# Patient Record
Sex: Male | Born: 1940 | Race: White | Hispanic: No
Health system: Midwestern US, Community
[De-identification: ages and names within clinical notes are randomized; demographics above are authoritative.]

## PROBLEM LIST (undated history)

## (undated) DIAGNOSIS — G4733 Obstructive sleep apnea (adult) (pediatric): Secondary | ICD-10-CM

## (undated) DIAGNOSIS — D7218 Eosinophilia in diseases classified elsewhere: Secondary | ICD-10-CM

## (undated) DIAGNOSIS — M301 Polyarteritis with lung involvement [Churg-Strauss]: Secondary | ICD-10-CM

## (undated) DIAGNOSIS — N390 Urinary tract infection, site not specified: Secondary | ICD-10-CM

## (undated) DIAGNOSIS — J45909 Unspecified asthma, uncomplicated: Secondary | ICD-10-CM

## (undated) DIAGNOSIS — J4520 Mild intermittent asthma, uncomplicated: Secondary | ICD-10-CM

## (undated) DIAGNOSIS — Z9109 Other allergy status, other than to drugs and biological substances: Secondary | ICD-10-CM

## (undated) DIAGNOSIS — K219 Gastro-esophageal reflux disease without esophagitis: Secondary | ICD-10-CM

## (undated) DIAGNOSIS — K2950 Unspecified chronic gastritis without bleeding: Secondary | ICD-10-CM

---

## 2009-06-21 LAB — BASIC METABOLIC PANEL
BUN: 20 mg/dL (ref 9–23)
CO2: 27 mmol/L (ref 20–31)
Calcium: 10 mg/dL (ref 8.6–10.5)
Chloride: 106 mmol/L (ref 99–109)
Creatinine: 0.8 mg/dL (ref 0.7–1.3)
Glucose: 86 mg/dL (ref 70–110)
Potassium: 4.4 mmol/L (ref 3.5–5.5)
Sodium: 139 mmol/L (ref 132–146)

## 2009-06-21 LAB — CBC
Hematocrit: 43.8 % (ref 37.0–54.0)
Hemoglobin: 14.7 g/dL (ref 12.5–16.5)
MCH: 29.6 pg (ref 26.0–35.0)
MCHC: 33.6 % (ref 32.0–34.5)
MCV: 88.1 fL (ref 80.0–99.9)
MPV: 10 fL (ref 7.0–12.0)
Platelets: 130 E9/L (ref 130–450)
RBC: 4.97 E12/L (ref 3.80–5.80)
RDW: 14.4 fL (ref 11.5–15.0)
WBC: 7.9 E9/L (ref 4.5–11.5)

## 2009-06-21 LAB — GFR CALCULATED: Gfr Calculated: >60 mL/min/{1.73_m2} (ref >=60)

## 2009-07-02 NOTE — Telephone Encounter (Signed)
Informed patient wife to continue current schedule of IV therapy, that it is ok to miss one no need to add another dose, per order of Dr. Buelah Manis.  Discussed with the patient and all questioned fully answered. He will call me if any problems arise.

## 2009-07-11 LAB — RENAL CALCULI

## 2009-08-13 NOTE — Progress Notes (Signed)
Dear [name], MD    I had the pleasure of seeing  Julian Castaneda in the [facility] regarding his  Chronic sinusitis, reactive airway disease and obstructive sleep apnea.     HISTORY OF PRESENT ILLNESS:    TYTON ABDALLAH is a 69 y.o. year old male who has a long standing history of allergies, elevated Ig E level around 600 requiring the administration of Xolair therapy. 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 He under when endoscopic surgery on 06/30/07 antrostomy bilateral with ethmoidectomies and left single sphenoidotomies and septoplasty. His treatment is working well, which included nasal rinses, Patanase, Astelin, and Xolair.    For his sleep apnea he is very compliant using the BiPAP machine nightly for at least 6 hours. importunely he still is having complaints of fatigue, irritability and memory morning problems . His current pressure are 13/ 7 cm H20. Becasue of the symptoms will have order a auto-titrator evaluation.     His lung function is stable at 81% of predicted and he is using QVAR with Xopenex as needed. His ACT is 25/25 indicating great control. He has no CP, SOB or leg edema. He has long standing GERD requiring surgery years ago. Overall, he is doing very well.     ALLERGIES:  Allergies   Allergen Reactions   ??? Nsaids Swelling   ??? Nexium (Esomeprazole Magnesium Trihydrate) Swelling   ??? Reglan (Metoclopramide Hcl) Other (See Comments)     States has bloating with this         PAST MEDICAL HISTORY:     Diagnosis Date   ??? ALLERGY    ??? Sinusitis    ??? GERD (gastroesophageal reflux disease)    ??? Hyperlipidemia    ??? Sleep apnea    ??? Diabetes mellitus    ??? Asthma           MEDICATIONS: Current outpatient prescriptions   Medication Sig Dispense Refill    ??? atorvastatin (LIPITOR) 80 MG tablet Take 80 mg by mouth daily.       ??? loratadine (CLARITIN) 10 MG tablet Take 10 mg by mouth daily.       ??? azelastine (ASTELIN) 137 MCG/SPRAY nasal spray 2 sprays by Nasal route 2 times daily. Use in each nostril as directed        ??? fluticasone (FLOVENT HFA) 110 MCG/ACT inhaler Inhale 2 puffs into the lungs 2 times daily.       ??? Cholecalciferol 400 UNIT TABS tablet Take 1,000 Units by mouth 2 times daily.       ??? fish oil-omega-3 fatty acids 1000 MG capsule Take 2 g by mouth daily.       ??? beclomethasone (QVAR) 80 MCG/ACT inhaler Inhale 2 puffs into the lungs 2 times daily.       ??? olopatadine (PATANASE) 0.6 % SOLN nassl soln 2 sprays by Nasal route 2 times daily.  3 Bottle  4   ??? XOPENEX HFA 45 MCG/ACT inhaler Inhale 1 puff into the lungs every 8 hours as needed for Wheezing.  1 Inhaler  12         SOCIAL AND OCCUPATIONAL HEALTH:  He is married with four children. The patient is a never smoked  There is not history of TB or  TB exposure.  No Etoh Abuse.  There is not asbestos or silica dust exposure.  The patient reports no coal, foundry, quarry or [employer] exposure.  Travel history reveals no trips.  Denies history of recreational or IV drug use. Denies hot tub exposure. The patient has no pets. Hobbies include watching soap opera.    History   Substance Use Topics   ??? Smoking status: Never Smoker    ??? Smokeless tobacco: Not on file   ??? Alcohol Use: No         SURGICAL HISTORY: Past Surgical History   Procedure Date   ??? Gastric fundoplication    ??? Septoplasty    ??? Hernia repair    ??? Nasal polyp surgery          FAMILY HISTORY: Family History   Problem Relation Age of Onset   ??? High Blood Pressure Father    ??? High Cholesterol Father    ??? Asthma Sister    ??? Diabetes Sister          REVIEW OF SYSTEMS:   Constitutional: General health good . No weight changes.  No fevers, fatigue or weakness.   Head: Patient denies any history of trauma, convulsive disorder or syncope.     Skin:  Patient denies history of changes in pigmentation, eruptions or pruritus.  No easy bruising or bleeding.  EENT: Patient denies any history of color blindness, photophobia, diplopia, inflammation, cataracts or glaucoma.   Patient denies history of deafness, tinnitus, pain, discharge or recurrent infections.  Patient has a long history of rhinitis, chronic nasal discharge, drainage, & nasal polyps; all requiring surgery. Patient denies history of soreness of mouth or tongue. Patient denies history of hoarseness, voice changes, sore throats or tonsillitis.    Lymphatic:  Patient denies history of enlargement, inflammation, pain or suppuration.    Cardiovascular:  Patient denies history of palpations, heart murmur, irregular rhythm, chest pain, exertional dyspnea, cyanosis, ascites, rheumatic fever, cold extremities or edema.  Respiration:  Patient denies wheezing, dyspnea, nocturnal dyspnea, cough, hemoptysis, pleurisy, TB or asthma.   Known OSA on [place] 13/ 7 cmH20  Gastrointestinal: Patient denies changes in appetite. No dysphagia, odynophagia or abdominal pain.  No hematochezia, melena, bowel habit changes or hemorrhoids.  No jaundice. GERD  Genitourinary:   Patient denies dysuria, frequency, urgency or incontinence.  Complains of difficulty in starting or stopping urinary stream.  Reports history of stones which is followed by [facility].  He has a history of benign prostatic hypertrophy but no prostate cancer.  Musculoskeletal: The patient denies history of arthritis or joint pains.  No loss of strength, fracture or dislocation.  Breasts:  No history of masses, lumps, pain, nipple changes or painful nipples.  Neurological: Patient denies vertigo, syncope.  No twitching, convulsions, loss of consciousness or memory.   Psychological: Patient denies moodiness, depression or anxiety.  No  obsessions, delusions, illusions or hallucinations.    Endocrine:  No history of goiter, exophthalmos or dryness of skin.   No polyuria or polyphagia.  No diabetes.    Hematopoietic:  No history of bleeding disorders or easy bruising.  Rheumatic:  No polyarthritis or inflammatory joint disease. History of positive P-ANCA    PHYSICAL EXAMINATION:  Filed Vitals:    08/13/2009  1:27 PM   BP: 120/68   Pulse: 80   Temp: 96 ??F (35.6 ??C)   TempSrc: Oral   Resp: 18   Height: 5\' 10"  (1.778 m)   Weight: 214 lb (  97.07 kg)   SpO2: 96%       Constitutional: This is a well developed, well nourished 69 y.o.  male  who is alert, oriented, cooperative and in no apparent distress.  Head was normocephalic and atraumatic.  Male patten baldness.   EENT: Eyes were equal and reactive to light and accommodation.  Extraocular muscles intact.  No conjunctival injections.  External canals are patent and no discharge was appreciated.  Septum was mildly deviated, mucosa was without erythema, exudates or cobblestoning.  No thrush was noted.  Swollen turbinates.   Neck: Supple without thyromegaly. No elevated JVP. Trachea was midline. No carotid bruits were auscultated.    Respiratory: Chest was symmetrical without dullness to percussion.  Breath sounds bilaterally were clear to auscultation. There were no wheezes, rhonchi or rales. There is no intercostal retraction or use of accessory muscles. No egophony noted.   Cardiovascular: Regular without murmur, clicks, gallops or rubs.  There is no left or right ventricular heave.    Pulses:  Carotid, radial and femoral pulses were equally bilaterally.    Abdomen: Slightly rounded and soft without organomegaly. No rebound, rigidity or guarding was appreciated.  +scars  Lymphatic: No lymphadenopathy.  Musculoskeletal: Musculoskeletal strength shows the patient to ambulate without assistance.  There is normal curvature of the spine.  No gross muscle weakness.    Extremities:  No lower extremity edema, ulcerations, tenderness, varicosities or erythema.  Muscle size, tone and strength are normal.  No involuntary movements are  noted.  Coordination appears adequate.  Sensory function appears intact.  Deep tendon reflexes are normal.   Skin:  Warm and dry.  Good color, turgor and pigmentation. No lesions. Varicoties noted.  Neurological/Psychiatric: The patient's general behavior, level of consciousness, thought content and emotional status is normal.  Cranial nerves II-XII are intact.      DATA: Spirometry done in the office today demonstrates an FVC of 3.31  liters which is 76 % of predicted with an FEV1 of  2.60 liters which is 81 % of predicted.  FEV1/FVC ratio is 78 %.  Maximum voluntary ventilation is 125 liters per minute or 99 % of predicted.  Flow volume loop shows no signs of intrathoracic or extrathoracic obstruction. Impression. Mild restrictive physiology unchanged from previous testing.     Asthma Control Test [ACT] is a 5 part questionnaire, which has a point value system that ranges from 1- 5. This test is a validated questionnaire to help determine asthma control.  A total score of 19 or below indicates that the patients asthma may not be well controlled. If used in conjunction with PFTs, the correlation of determining asthma control is even stronger.  Today on testing the ACT was 25 out of max of 25.       IMPRESSION:      AMORE GRATER a 69 y.o. male with intermittent asthma/hyperreactive airway mainly from chronic sinusitis and upper airway problems. His chronic allergies and sinusitis with rhinitis and nasal  polyposis is stable after surgery. He also has a failed Nissen fundoplication with chronic gastroparesis and GERD which affects his airways. Currently he is doing fairly well with Xolair, nasal steroids and antihistamines. He uses the QVAR, one puff B.I.D. & his PFTs are stable. The only current issue is his worsening sleep apnea symptoms despite Bilevel Rx.  With this in mind, I would like to proceed with the following;                PLAN:  At this time, I would continue olopatadine, Astelin and Xolair for  his allergies and elevated IgE level. I need to repeat the P-ANCA level which maybe an additional reason for his symptoms. His asthma/reactive airways are stable, so we are going to continue the Xopenex inhaler; Inhale 1 puff into the lungs every 8 hours. I will obtain an X-ray chest PA and lateral at the next office visit.     I really concerned about his sleep apnea symptoms despite Bilevel therapy. I have requested a auto-titrator. We discussed the test and treatment options if a positive result is found. I also explained that untreated sleep apnea can increase the risk of high blood pressure, heart attack, stroke, and diabetes. In addition OSA can increase the risk of, or worsen, heart failure and make arrhythmias  or irregular heartbeats, more likely. Along with increasing the chance of having work-related or driving accidents.   We outlined that sleep apnea is a chronic condition that requires long-term management.  We refilled his medications and I will update you once the testing is completed.     I hope this updates you on my evaluation and clinical thinking. Thank you for allowing me to participate in [name] care.     Sincerely,        Elmer Ramp, D.O., FCCP, FACOI  TJB/kb

## 2009-08-13 NOTE — Patient Instructions (Signed)
Discharge instructions given and patient demonstrates verbal understanding

## 2009-08-13 NOTE — Progress Notes (Signed)
Pt states he is doing well with no shortness of breath. Uses bi-pap at night no O2. He is retired from [employer] after 49yrs. Doesn't know if he was ever exposed to any  chemicals.    PFT's done and review of xopenex inhaler by respiratory therapist.  Asthma Control test score is 25.

## 2009-09-02 NOTE — Progress Notes (Addendum)
After reviewing his auto titration report.  Dr. Buelah Manis changed his BIPAP setting to 18/9.  Spoke with Lupita Leash from [facility] and she will set the pt up with the new settings.  Script faxed to [phone].  Pt notified of changes as well.

## 2010-02-25 NOTE — Progress Notes (Signed)
Dear Julian Picket Wayne Sever, MD    I had the pleasure of seeing  Julian Castaneda in the [facility] regarding his  Julian Castaneda, chronic sinusitis, reactive airway disease & obstructive sleep apnea.     HISTORY OF PRESENT ILLNESS:    Julian Castaneda is a 69 y.o. year old male who has a long standing history of allergies, elevated Ig E level around 600 requiring the administration of Xolair therapy. 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 He under when endoscopic surgery on 06/30/07 antrostomy bilateral with ethmoidectomies and left single sphenoidotomies and septoplasty. His treatment is working well, which included nasal rinses, Patanase, Astelin, and Xolair.    For his sleep apnea he is very compliant using the BiPAP machine nightly for at least 6 hours. importunely he still is having complaints of fatigue, irritability and memory morning problems . His current pressure are 18/ 9 cm H20. We could add Nuvigil but currently the plan is to follow.     His lung function is stable with and FEV1 of 94% of predicted.  He is using QVAR with Xopenex as needed. His ACT is 25/25 indicating great control. He has no CP, SOB or leg edema. No neurologic symptoms. He has long standing GERD requiring anti-reflux surgery years ago.     Over the past month, he developed a cough that lasted for about six weeks. He had congestion and trial course of antibiotics. He is not SOB & is using QVAR BID. When he went in for his allergy immunotherapy he mentioned the complaints and a trial course of a stomach pill for reflux was tried. He then called Dr. Lavone Nian and had a repeat EGD which was negative. His antireflux surgery anastomosis was stable and clear. He is still not sleeping well despite  adjusting his BiPAP to the higher pressure. He was also found to have a elevated PSA and a recent biopsy done of the prostate which was benign. I will repeat P-ANCA levels & SED rate. He will remain on the current treatment. He is having a hard time with the illness of his granddaughter who in the ICU.     ALLERGIES:  Allergies   Allergen Reactions   ??? Nexium (Esomeprazole Magnesium Trihydrate) Swelling   ??? Nsaids Swelling   ??? Reglan (Metoclopramide Hcl) Other (See Comments)     States has bloating with this       PAST MEDICAL HISTORY:     Diagnosis Date   ??? ALLERGY    ??? Sinusitis    ??? GERD (gastroesophageal reflux disease)    ??? Hyperlipidemia    ??? Sleep apnea      CHM 18/9   ??? Diabetes mellitus    ??? Asthma    ??? CSS (Churg-Strauss Castaneda)    ??? Kidney stone    ??? Elevated PSA      negative biospy        MEDICATIONS: Current Outpatient Prescriptions   Medication Sig Dispense Refill   ??? flunisolide (NASALIDE) 0.025 % SOLN Inhale 2 sprays into the lungs daily.         ??? beclomethasone (QVAR) 80 MCG/ACT inhaler Inhale 2 puffs into the lungs 2 times daily for 30 days.  1 Inhaler  12   ??? atorvastatin (LIPITOR) 80  MG tablet Take 80 mg by mouth daily.       ??? azelastine (ASTELIN) 137 MCG/SPRAY nasal spray 2 sprays by Nasal route daily. Use in each nostril as directed       ??? fluticasone (FLOVENT HFA) 110 MCG/ACT inhaler Inhale 2 puffs into the lungs 2 times daily.       ??? Cholecalciferol 400 UNIT TABS tablet Take 1,000 Units by mouth 2 times daily.       ??? fish oil-omega-3 fatty acids 1000 MG capsule Take 2 g by mouth daily.       ??? beclomethasone (QVAR) 80 MCG/ACT inhaler Inhale 2 puffs into the lungs 2 times daily.       ??? loratadine (CLARITIN) 10 MG tablet Take 10 mg by mouth daily.       ??? olopatadine (PATANASE) 0.6 % SOLN nassl soln 2 sprays by Nasal route 2 times daily.  3 Bottle  4   ??? XOPENEX HFA 45 MCG/ACT inhaler Inhale 1 puff into the lungs every 8 hours as needed for Wheezing.  1 Inhaler  12       SOCIAL AND  OCCUPATIONAL HEALTH:  He is married with four children. The patient is a never smoked  There is not history of TB or TB exposure.  No Etoh Abuse.  There is not asbestos or silica dust exposure.  The patient reports no coal, foundry, quarry or [employer] exposure.  Travel history reveals no trips.  Denies history of recreational or IV drug use. Denies hot tub exposure. The patient has no pets. Hobbies include watching soap opera.    History   Substance Use Topics   ??? Smoking status: Never Smoker    ??? Smokeless tobacco: Never Used   ??? Alcohol Use: No       SURGICAL HISTORY: Past Surgical History   Procedure Date   ??? Gastric fundoplication    ??? Septoplasty    ??? Hernia repair    ??? Nasal polyp surgery    ??? Prostate biopsy 2011     negative pathology   ??? Upper gastrointestinal endoscopy 12/2009     with biopsy, pathology negative       FAMILY HISTORY: Family History   Problem Relation Age of Onset   ??? High Blood Pressure Father    ??? High Cholesterol Father    ??? Asthma Sister    ??? Diabetes Sister        REVIEW OF SYSTEMS:   Constitutional: General health good . No weight changes.  No fevers, fatigue or weakness.   Head: Patient denies any history of trauma, convulsive disorder or syncope.    Skin:  Patient denies history of changes in pigmentation, eruptions or pruritus.  No easy bruising or bleeding.  EENT: Patient denies any history of color blindness, photophobia, diplopia, inflammation, cataracts or glaucoma.   Patient denies history of deafness, tinnitus, pain, discharge or recurrent infections.  Patient has a long history of rhinitis, chronic nasal discharge, drainage, & nasal polyps; all requiring surgery. Patient denies history of soreness of mouth or tongue. Patient denies history of hoarseness, voice changes, sore throats or tonsillitis.    Lymphatic:  Patient denies history of enlargement, inflammation, pain or suppuration.    Cardiovascular:  Patient denies history of palpations, heart murmur, irregular rhythm,  chest pain, exertional dyspnea, cyanosis, ascites, rheumatic fever, cold extremities or edema.  Respiration:  Patient denies wheezing, dyspnea, nocturnal dyspnea, cough, hemoptysis, pleurisy, TB or asthma.   Known OSA  on [place] 19/ 8 cm H20. (+) P ANCA. Churg Strauss Sydrome.   Gastrointestinal: Patient denies changes in appetite. No dysphagia, odynophagia or abdominal pain.  No hematochezia, melena, bowel habit changes or hemorrhoids.  No jaundice. GERD  Genitourinary:   Patient denies dysuria, frequency, urgency or incontinence.  Complains of difficulty in starting or stopping urinary stream.  Reports history of stones which is followed by [facility].  He has benign prostatic hypertrophy but no prostate cancer. Recent Biopsy.   Musculoskeletal: The patient denies history of arthritis or joint pains.  No loss of strength or dislocation.  Breasts:  No history of masses, lumps, pain, nipple changes or painful nipples.  Neurological: Patient denies vertigo, syncope.  No twitching or memory.   Psychological: Patient denies moodiness, depression or anxiety.  No obsessions or hallucinations.    Endocrine:  No history of goiter, exophthalmos or dryness of skin.  No polyuria or polyphagia.  No diabetes.   Hematopoietic:  No history of bleeding disorders or easy bruising.  Rheumatic:  No polyarthritis or inflammatory joint disease. History of positive P-ANCA    PHYSICAL EXAMINATION:  Filed Vitals:    02/25/10 1306   BP: 148/90   Pulse: 52   Temp: 97.6 ??F (36.4 ??C)   TempSrc: Oral   Resp: 18   Height: 5\' 8"  (1.727 m)   Weight: 208 lb (94.348 kg)   SpO2: 98%     Constitutional: This is a well developed, well nourished 69 y.o.  male  who is alert, oriented, cooperative and in no apparent distress.  Head was normocephalic and atraumatic.  Male patten baldness.   EENT: Eyes were equal and reactive to light and accommodation.  Extraocular muscles intact.  No conjunctival injections.  External canals are patent. No discharge was  appreciated.  Septum was mildly deviated, mucosa was without erythema, exudates or cobblestoning.  No thrush was noted.  Swollen turbinates.   Neck: Supple without thyromegaly. No elevated JVP. Trachea was midline. No carotid bruits.    Respiratory: Chest was symmetrical without dullness to percussion.  Breath sounds bilaterally were clear to auscultation. There were no wheezes, rhonchi or rales. No intercostal retraction or use of accessory muscles. No egophony noted.   Cardiovascular: Regular without murmur, clicks, gallops or rubs.  No left or right ventricular heave.    Pulses:  Carotid, radial and femoral pulses were equally bilaterally.    Abdomen: Slightly rounded, soft without organomegaly. No rebound, rigidity or guarding.  Surgical (+) scars  Lymphatic: No lymphadenopathy.  Musculoskeletal: Musculoskeletal strength shows the patient to ambulate without assistance.  There is normal curvature of the spine.  No gross muscle weakness.    Extremities:  No lower extremity edema, ulcerations, tenderness, varicosities or erythema.  Muscle size, tone and strength are normal.  No involuntary movements are noted.  Coordination appears adequate.  Sensory function appears intact.  Deep tendon reflexes are normal.   Skin:  Warm and dry.  Good color, turgor and pigmentation. No lesions. Varicoties noted.  Neurological/Psychiatric: The patient's general behavior, level of consciousness, thought content and emotional status is normal.  Cranial nerves II-XII are intact.      DATA:   Today Spirometry done in the office today demonstrates an FVC of 3.71  liters which is 89 % of predicted with an FEV1 of  2.87 liters which is 94 % of predicted.  FEV1/FVC ratio is 78%.  Maximum voluntary ventilation is 125 liters per minute or 99 % of predicted.  Flow  volume loop shows no signs of intrathoracic or extrathoracic obstruction. Impression. Mild restrictive physiology unchanged from previous testing.     Asthma Control Test [ACT] is  a 5 part questionnaire, which has a point value system that ranges from 1- 5. This test is a validated questionnaire to help determine asthma control.  A total score of 19 or below indicates that the patients asthma may not be well controlled. If used in conjunction with PFTs, the correlation of determining asthma control is even stronger.  Today on testing the ACT was 25 out of max of 25.     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 While we have never proven the systemic vasculitis or necrotizing vasculitis and he has no renal, neuropathic or cardiovascular involvement; the finding of chronic sinusitis, eosinophilia and asthma aid in the diagnosis.  With well controlled asthma/hyperreactive airway contributed from chronic sinusitis and upper airway problems all consistent with Julian Castaneda. He has no neuropathy or neuropathic mononeuritis. His P ANCA is 1:160. However, the clinical utility of ANCA in CSS raises a controversial issue of the sensitivity and specificity of ANCA in systemic Vasculitis. In CSS P-ANCA is 50%.  He should never receive leukotriene modifier, allopurinol, propylthiouracil and quinine with this history of CSS for it will cause active flairs?. His chronic allergies, sinusitis with rhinitis and nasal  polyposis is stable after surgery. He also has a failed Nissen fundoplication with chronic gastroparesis and GERD stable based on last. Currently he is doing fairly well with Xolair, nasal steroids and antihistamines. He uses the QVAR, one puff B.I.D. & his PFTs are stable. The only current issue is his sleep apnea symptoms despite adjusted BiPAP.  With this in mind, I would like to proceed with the following;                PLAN:    At this time, I would continue olopatadine, Astelin & Xolair for his allergies and elevated IgE level. I need to repeat the P-ANCA levels. 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  His asthma/reactive  airways are stable, so we are going to continue the Xopenex inhaler PRN; QVAR 1 puff BID. I will obtain an X-ray chest PA and lateral at the next office visit.     Sleep apnea symptoms are still moderate despite adjusting Bilevel therapy. We discussed the increase the risk of high blood pressure, heart attack, stroke, and diabetes. In addition OSA can increase the risk of, or worsen, heart failure and make arrhythmias  or irregular heartbeats, more likely. Along with increasing the chance of having work-related or driving accidents.   We outlined that sleep apnea is a chronic condition that requires long-term management.  We refilled his medications and I will update you once the testing is completed. He will follow up in 3 months.     I hope this updates you on my evaluation and clinical thinking. Thank you for allowing me to participate in [name] care.     Sincerely,        Elmer Ramp, D.O., [facility], [facility]  Associate Professor of Medicine

## 2010-02-25 NOTE — Progress Notes (Signed)
Pt feels well today, no complaints today.  Verbalized understanding of office visit, will complete lab work this week,  He will call with questions or complaints.

## 2010-02-27 LAB — SEDIMENTATION RATE, MANUAL: Sed Rate: 2 mm/hr (ref 0–15)

## 2010-03-01 LAB — ANTI-NEUTROPHILIC CYTOPLASMIC ANTIBODY
Myeloperoxidase Ab: 0 AU/mL (ref 0–19)
Serine Protease 3 Ab: 0 AU/mL (ref 0–19)

## 2010-06-27 ENCOUNTER — Encounter

## 2010-06-27 MED ORDER — EPINEPHRINE 0.3 MG/0.3ML IJ DEVI
0.3 | Freq: Once | INTRAMUSCULAR | Status: DC | PRN
Start: 2010-06-27 — End: 2011-09-04

## 2010-06-27 NOTE — Progress Notes (Signed)
Patient will use advair instead of qvar for 2 months.  He will follow up in 2 months. Discharge instructions have been discussed with the patient.  Patient advised to call our office with any questions or concerns. Patient voices understanding.

## 2010-07-08 NOTE — Procedures (Signed)
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      INTERPRETATION:  Pulmonary function tests reveal a forced vital capacity post   bronchodilator of 3.46 L, 79% of predicted with an FEV1 of 3.03 L, 94% of   predicted.  However, the pre-bronchodilator FEV1  is only 2.17 L, 67% of   predicted.  That means a 40% bronchodilator response is noted.  FEV1/FVC   ratio is 75%.      Static lung volumes revealed a total lung capacity of 83%.  Remaining lung   volumes are normal.  Diffusing capacity is normal at 72% of predicted and   corrects to 90% when adjusted for alveolar ventilation.      IMPRESSION:  Persistent hyperreactive component with a FEV1 that normalizes    with bronchodilators. Clinical correlation needed.            Dictated by:  Elmer Ramp, D.O., FCCP, FACOI            TB / nmt-th   DD: 07/08/2010    2:25 P    DT: 07/08/2010  3:09 P   [id number]    [id number]   CC:  Elmer Ramp DO, FCCP FACOI

## 2010-07-15 NOTE — Progress Notes (Signed)
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     HISTORY OF PRESENT ILLNESS:    Julian Castaneda is a 70 y.o. year old male who has a long standing history of allergies, elevated Ig E level around 600 requiring the administration of Xolair therapy. 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 Last level in 06/2010 was negative. The her recall He under when endoscopic surgery on 06/30/07 antrostomy bilateral with ethmoidectomies and left single sphenoidotomies and septoplasty. His treatment is working well, which included nasal rinses, Patanase, Astelin, and Xolair. I will  With his sleep apnea he is very compliant using the BiPAP machine nightly for at least 6 hours. importunely he still is having complaints of fatigue, irritability and memory morning problems . His current pressure are 18/ 9 cm H20.     As you may be aware, Rowe granddaughter has been in a coma for over six months. He has been spending a great deal of time at her bedside and because of such has not been very compliant with his medication or caring for himself very well. Because of this his PFTs have really declined. His previous lung function was FEV1 of 94% of predicted, today it is only 2.17 or 67% of predicted.  He is using QVAR along with Xopenex as needed. His PANCA and CANA were negative 02/2010. His ACT is 19/25 indicating a decline in control. He has no CP, SOB or leg edema. No neurologic symptoms. He has long standing GERD requiring anti-reflux surgery years ago.     When he went in for his immunotherapy which has helped. At last visit his  GERD and cough was an issue but with a trial course of a PPI for reflux and repeat EGD which was negative by Dr. Lavone Nian he has had no repeat complaints. His antireflux surgery anastomosis was stable and clear. He is still not sleeping well despite adjusting his BiPAP to the higher pressure. He was also found to have a elevated PSA and a recent biopsy done of the prostate which was benign. We will need to follow his P-ANCA levels & SED rate. We asked him to take care of his health and resume his previous treatment schedule. He is having a hard time with the illness of his granddaughter. He will return in a month to recheck his lung function.     ALLERGIES:  Allergies   Allergen Reactions   ??? Nexium (Esomeprazole Magnesium Trihydrate) Swelling   ??? Nsaids Swelling   ??? Reglan (Metoclopramide Hcl) Other (See Comments)     States has bloating with this       PAST MEDICAL HISTORY:     Diagnosis Date   ??? Allergy    ??? Sinusitis    ??? GERD (gastroesophageal reflux disease)    ??? Hyperlipidemia    ??? Sleep apnea      CHM 18/9   ??? Diabetes mellitus    ??? Asthma    ??? CSS (Churg-Strauss syndrome)    ??? Kidney stone    ??? Elevated PSA      negative biospy  MEDICATIONS: Current Outpatient Prescriptions   Medication Sig Dispense Refill   ??? FLUTICASONE FUROATE NA 2 sprays by Nasal route daily.         ??? enalapril (VASOTEC) 5 MG tablet Take 5 mg by mouth daily.         ??? EPINEPHrine (EPIPEN 2-PAK) 0.3 MG/0.3ML DEVI injection Inject 0.3 mg into the muscle once as needed.         ??? EPINEPHrine (EPIPEN 2-PAK) 0.3 MG/0.3ML DEVI injection Inject 0.3 mLs into the muscle once as needed for 1 dose.  1 Device  3   ??? atorvastatin (LIPITOR) 80 MG tablet Take 80 mg by mouth daily.       ??? azelastine (ASTELIN) 137 MCG/SPRAY nasal spray 2 sprays by Nasal route daily. Use in each nostril as directed       ??? Cholecalciferol 400 UNIT TABS tablet Take 1,000 Units by mouth daily.       ??? fish oil-omega-3 fatty acids 1000 MG capsule Take 2 g by mouth  daily.       ??? XOPENEX HFA 45 MCG/ACT inhaler Inhale 1 puff into the lungs every 8 hours as needed for Wheezing.  1 Inhaler  12   ??? olopatadine (PATANASE) 0.6 % SOLN nassl soln 2 sprays by Nasal route 2 times daily.  3 Bottle  4       SOCIAL AND OCCUPATIONAL HEALTH:  He is married with four children. The patient is a never smoked  There is not history of TB or TB exposure.  No Etoh Abuse.  There is not asbestos or silica dust exposure.  The patient reports no coal, foundry, quarry or [employer] exposure.  Travel history reveals no trips.  Denies history of recreational or IV drug use. Denies hot tub exposure. The patient has no pets. Hobbies include watching soap opera.    History   Substance Use Topics   ??? Smoking status: Never Smoker    ??? Smokeless tobacco: Never Used   ??? Alcohol Use: No       SURGICAL HISTORY: Past Surgical History   Procedure Date   ??? Gastric fundoplication    ??? Septoplasty    ??? Hernia repair    ??? Nasal polyp surgery    ??? Prostate biopsy 2011     negative pathology   ??? Upper gastrointestinal endoscopy 12/2009     with biopsy, pathology negative       FAMILY HISTORY: Family History   Problem Relation Age of Onset   ??? High Blood Pressure Father    ??? High Cholesterol Father    ??? Asthma Sister    ??? Diabetes Sister        REVIEW OF SYSTEMS:   Constitutional: General health good . No weight changes.  No fevers, fatigue or weakness.   Head: Patient denies any history of trauma, convulsive disorder or syncope.    Skin:  Patient denies history of changes in pigmentation, eruptions or pruritus.  No easy bruising or bleeding.  EENT: Patient denies any history of color blindness, photophobia, diplopia, inflammation, cataracts or glaucoma.   Patient denies history of deafness, tinnitus, pain, discharge or recurrent infections.  Patient has a long history of rhinitis, chronic nasal discharge, drainage, & nasal polyps; all requiring surgery. Patient denies history of soreness of mouth or tongue. Patient denies  history of hoarseness, voice changes, sore throats or tonsillitis.    Lymphatic:  Patient denies history of enlargement, inflammation, pain or suppuration.  Cardiovascular:  Patient denies history of palpations, heart murmur, irregular rhythm, chest pain, exertional dyspnea, cyanosis, ascites, rheumatic fever, cold extremities or edema.  Respiration:  Patient denies wheezing, dyspnea, nocturnal dyspnea, cough, hemoptysis, pleurisy, TB or asthma.   Known OSA on [place] 19/ 8 cm H20. 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   Gastrointestinal: Patient denies changes in appetite. No dysphagia, odynophagia or abdominal pain.  No hematochezia, melena, bowel habit changes or hemorrhoids.  No jaundice. GERD Gastic surgery in past with poor results.   Genitourinary:   Patient denies dysuria, frequency, urgency or incontinence.  Complains of difficulty in starting or stopping urinary stream.  Reports history of stones which is followed by [facility].  He has benign prostatic hypertrophy but no prostate cancer. Recent Biopsy.   Musculoskeletal: The patient denies history of arthritis or joint pains.  No loss of strength or dislocation.  Breasts:  No history of masses, lumps, pain, nipple changes or painful nipples.  Neurological: Patient denies vertigo, syncope.  No twitching or memory.   Psychological: Patient denies moodiness, depression or anxiety.  No obsessions or hallucinations.    Endocrine:  No history of goiter, exophthalmos or dryness of skin.  No polyuria or polyphagia.  No diabetes.   Hematopoietic:  No history of bleeding disorders or easy bruising.  Rheumatic:  No polyarthritis or inflammatory joint disease. History of positive P-ANCA    PHYSICAL EXAMINATION:  Filed Vitals:    06/27/10 1622   BP: 120/64   Pulse: 79   Temp: 98.1 ??F (36.7 ??C)   TempSrc: Oral   Resp: 18   Height: 5\' 10"  (1.778 m)   Weight: 212 lb (96.163 kg)   SpO2: 95%     Constitutional: This is a well developed, well nourished 70 y.o.  male   who is alert, oriented, cooperative and in no apparent distress.  Head was normocephalic and atraumatic.  Male patten baldness.   EENT: Eyes were equal and reactive to light and accommodation.  Extraocular muscles intact.  No conjunctival injections.  External canals are patent. No discharge was appreciated.  Septum was mildly deviated, mucosa was without erythema, exudates or cobblestoning.  No thrush was noted.  Swollen turbinates.   Neck: Supple without thyromegaly. No elevated JVP. Trachea was midline. No carotid bruits.    Respiratory: Chest was symmetrical without dullness to percussion.  Breath sounds bilaterally were clear to auscultation. There were no wheezes, rhonchi or rales. No intercostal retraction or use of accessory muscles. No egophony noted.   Cardiovascular: Regular without murmur, clicks, gallops or rubs.  No left or right ventricular heave.    Pulses:  Carotid, radial and femoral pulses were equally bilaterally.    Abdomen: Slightly rounded, soft without organomegaly. No rebound, rigidity or guarding.  Surgical (+) scars  Lymphatic: No lymphadenopathy.  Musculoskeletal: Musculoskeletal strength shows the patient to ambulate without assistance.  Normal curvature of the spine.  No gross muscle weakness.    Extremities:  No lower extremity edema, ulcerations, tenderness, varicosities or erythema.  Muscle size, tone and strength are normal.  No involuntary movements are noted.  Coordination appears adequate.  Sensory function appears intact.  Deep tendon reflexes are normal. No rash.   Skin:  Warm and dry.  Good color, turgor and pigmentation. No lesions. Varicoties noted.  Neurological/Psychiatric: The patient's general behavior, level of consciousness, thought content and emotional status is normal.  Cranial nerves II-XII are intact.      DATA:     Today  Spirometry were compared to previous demonstrates an FVC of 2.90 down from 3.71  liters which is 66 % of predicted with an FEV1 of  2.17 liters  which is down from 2.87 liters which is 67 % of predicted.  FEV1/FVC ratio is 78%.  A 405 bronchodilator response is noted. Maximum voluntary ventilation is 125 liters per minute or 99 % of predicted.  Flow volume loop shows no signs of intrathoracic or extrathoracic obstruction. Impression. Moderate airflow obstruction with reversibility noted, worse that previous testing.     Asthma Control Test [ACT] is a 5 part questionnaire, which has a point value system that ranges from 1- 5. This test is a validated questionnaire to help determine asthma control.  A total score of 19 or below indicates that the patients asthma may not be well controlled. If used in conjunction with PFTs, the correlation of determining asthma control is even stronger.  Today on testing the ACT was 19 out of max of 25. Impression: Poor control.     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 While we have never proven the systemic vasculitis or necrotizing vasculitis part and he has no renal, neuropathic or cardiovascular involvement; the finding of chronic sinusitis, eosinophilia and asthma aid in the diagnosis.  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 He has no neuropathy or neuropathic mononeuritis. His P ANCA is 1:160 but fluctuates and on last check was normal.  However, the clinical utility of ANCA in CSS raises a controversial issue of the sensitivity and specificity of ANCA in systemic vasculitis. In CSS P-ANCA is 50%.  He should never receive leukotriene modifier, allopurinol, propylthiouracil and quinine with this history of CSS for it will cause active flairs?.     His chronic allergies, sinusitis with rhinitis and nasal  polyposis is stable after surgery. He also has a failed Nissen fundoplication with chronic gastroparesis and GERD stable based on last. Currently he is doing fairly well with  Xolair, nasal steroids and antihistamines. He uses the QVAR, one puff B.I.D. & his PFTs are stable. The only current issue is his sleep apnea symptoms despite adjusted BiPAP.  With this in mind, I would like to proceed with the following;                PLAN:    At this time, I would continue olopatadine, Astelin & Xolair for his allergies and elevated IgE level. I need to repeat the P-ANCA levels. 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  His asthma/reactive airways are has really declined do to his granddaughters illness, so we are going to continue the Xopenex inhaler PRN; QVAR 1 puff BID. I will obtain an X-ray chest PA and lateral at the next office visit.     Sleep apnea symptoms are still moderate despite adjusting Bilevel therapy.  We refilled his medications and I will update you once the testing is completed. He will follow up in a month or so for re-evaluation.     We hope this updates you on my evaluation and clinical thinking. Thank you for allowing [place] to participate in [name] care.     Sincerely,        Elmer Ramp, D.O., [facility], [facility]  Associate Professor of Medicine  Director, [facility]

## 2010-07-15 NOTE — Procedures (Signed)
KNIGHT, OELKERS                                 [id number]      DATE OF SERVICE:  06/27/2010      PULMONOLOGIST:  Elmer Ramp DO, FCCP FACOI         REFERRING PHYSICIAN:  Lonzo Cloud. [name], M.D.      INTERPRETATION:  Pulmonary function tests revealed a forced vital capacity   post-bronchodilator of 3.46 L, 79% of predicted, with an FEV1 of 2.17 L, 67%   of predicted, with and FEV1/FVC ratio of 75%.  However, there is a 40%   bronchodilator response in the clinical laboratory setting.  Maximum   voluntary ventilation is 118 L per minute, 93% of predicted.      Total lung capacity is normal at 83% of predicted.  Diffusing capacity is 90%   when corrected for alveolar ventilation. When compared to previous,   significant reduction in FEV1 is noted. Clinical context needed.            Dictated by:  Elmer Ramp, D.O., FCCP, FACOI            TB / nmt   DD: 07/15/2010   11:56 A    DT: 07/15/2010 12:10 P   [id number]    [id number]   CC:  Elmer Ramp DO, FCCP FACOI

## 2010-07-24 MED ORDER — OMALIZUMAB 150 MG SC SOLR
150 MG | SUBCUTANEOUS | Status: DC
Start: 2010-07-24 — End: 2013-01-06

## 2010-09-30 NOTE — Progress Notes (Signed)
Patient states he walks 4.2  mph for 3 miles daily and he doesn't get SOB. Wears Bipap nightly for 5 hours. Settings are 18/9. Epworth 13.  ACT  25  Up to date on flu and pneumonia vaccines. Denies any new medical or surgical history.

## 2010-09-30 NOTE — Progress Notes (Signed)
Patient will follow up in 6 months. Discharge instructions have been discussed with the patient.  Patient advised to call our office with any questions or concerns. Patient voices understanding.

## 2010-10-01 NOTE — Progress Notes (Signed)
Department of Internal Medicine  Division of Pulmonary, Critical Care & Sleep Medicine  [facility]    [name], MD    I had the pleasure of seeing  Julian Castaneda in the [facility] regarding his  [name], chronic sinusitis, reactive airway disease & obstructive sleep apnea.     HISTORY OF PRESENT ILLNESS:    Julian Castaneda is a 70 y.o. year old male who has a long standing history of allergies, elevated Ig E level around 600 requiring the administration of Xolair therapy. 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 Last level in 06/2010 was negative. The her recall He under when endoscopic surgery on 06/30/07 antrostomy bilateral with ethmoidectomies and left single sphenoidotomies and septoplasty. His treatment is working well, which included nasal rinses, Patanase, Astelin, and Xolair. I will  With his sleep apnea he is very compliant using the BiPAP machine nightly for at least 6 hours. importunely he still is having complaints of fatigue, irritability and memory morning problems . His current pressure are 18/ 9 cm H20.     As you may be aware, Benen granddaughter has been in a coma for > six months. He has been spending a great deal of time at her bedside and because of such has not been very compliant with his medication or caring for himself very well. Because of this his PFTs declined. He is using QVAR along with Xopenex as needed. His PANCA and CANA were negative 02/2010. His ACT is 19/25 indicating a decline in control. He has no CP, SOB or leg edema. No neurologic symptoms. He has long standing GERD requiring anti-reflux surgery years ago.     His is receiving his Xolair immunotherapy which has helped. His GERD and  cough was an issue but with a trial course of a PPI for reflux and repeat EGD which was negative by Dr. Lavone Nian he has had no repeat complaints. His antireflux surgery anastomosis was stable and clear. He is still not sleeping well despite adjusting his BiPAP to the higher pressure. He was also found to have a elevated PSA and a recent biopsy done of the prostate which was benign. We will need to follow his P-ANCA levels & SED rate. We asked him to take care of his health and resume his previous treatment schedule. He is having a hard time with the illness of his granddaughter. He will return in a 4 month to recheck his lung function.       ALLERGIES:  Allergies   Allergen Reactions   ??? Nexium (Esomeprazole Magnesium Trihydrate) Swelling   ??? Nsaids Swelling   ??? Reglan (Metoclopramide Hcl) Other (See Comments)     States has bloating with this       PAST MEDICAL HISTORY:     Diagnosis Date   ??? Allergy    ??? Sinusitis    ??? GERD (gastroesophageal reflux disease)    ??? Hyperlipidemia    ??? Sleep apnea      CHM 18/9   ??? Diabetes mellitus    ??? Asthma    ??? CSS (Churg-Strauss syndrome)    ??? Kidney stone    ??? Elevated PSA      negative biospy  MEDICATIONS: Current Outpatient Prescriptions   Medication Sig Dispense Refill   ??? fluticasone-salmeterol (ADVAIR HFA) 230-21 MCG/ACT inhaler Inhale 1 puff into the lungs daily.         ??? omalizumab (XOLAIR) 150 MG injection 375mg  subcut every two weeks.  Pt receives injections at [facility] infusion center  375 mg  24   ??? FLUTICASONE FUROATE NA 2 sprays by Nasal route daily.         ??? enalapril (VASOTEC) 5 MG tablet Take 5 mg by mouth daily.         ??? EPINEPHrine (EPIPEN 2-PAK) 0.3 MG/0.3ML DEVI injection Inject 0.3 mg into the muscle once as needed.         ??? EPINEPHrine (EPIPEN 2-PAK) 0.3 MG/0.3ML DEVI injection Inject 0.3 mLs into the muscle once as needed for 1 dose.  1 Device  3   ??? atorvastatin (LIPITOR) 80 MG tablet Take 80 mg by mouth daily.       ??? azelastine (ASTELIN) 137 MCG/SPRAY  nasal spray 2 sprays by Nasal route daily. Use in each nostril as directed       ??? Cholecalciferol 400 UNIT TABS tablet Take 1,000 Units by mouth daily.       ??? fish oil-omega-3 fatty acids 1000 MG capsule Take 2 g by mouth daily.       ??? olopatadine (PATANASE) 0.6 % SOLN nassl soln 2 sprays by Nasal route 2 times daily.  3 Bottle  4   ??? XOPENEX HFA 45 MCG/ACT inhaler Inhale 1 puff into the lungs every 8 hours as needed for Wheezing.  1 Inhaler  12       SOCIAL AND OCCUPATIONAL HEALTH:  He is married with four children. The patient is a never smoked  There is not history of TB or TB exposure.  No Etoh Abuse.  There is not asbestos or silica dust exposure.  The patient reports no coal, foundry, quarry or [employer] exposure.  Travel history reveals no trips.  Denies history of recreational or IV drug use. Denies hot tub exposure. The patient has no pets. Hobbies include watching soap opera.    History   Substance Use Topics   ??? Smoking status: Never Smoker    ??? Smokeless tobacco: Never Used   ??? Alcohol Use: No       SURGICAL HISTORY: Past Surgical History   Procedure Date   ??? Gastric fundoplication    ??? Septoplasty    ??? Hernia repair    ??? Nasal polyp surgery    ??? Prostate biopsy 2011     negative pathology   ??? Upper gastrointestinal endoscopy 12/2009     with biopsy, pathology negative       FAMILY HISTORY: Family History   Problem Relation Age of Onset   ??? High Blood Pressure Father    ??? High Cholesterol Father    ??? Asthma Sister    ??? Diabetes Sister        REVIEW OF SYSTEMS:   Constitutional: General health good . No weight changes.  No fevers, fatigue or weakness.   Head: Patient denies any history of trauma, convulsive disorder or syncope.    Skin:  Patient denies history of changes in pigmentation, eruptions or pruritus.  No easy bruising or bleeding.  EENT: Patient denies any history of color blindness, photophobia, diplopia, inflammation, cataracts or glaucoma.   Patient denies history of deafness, tinnitus,  pain, discharge or recurrent infections.  Patient has a long history  of rhinitis, chronic nasal discharge, drainage, & nasal polyps; all requiring surgery. Patient denies history of soreness of mouth or tongue. Patient denies history of hoarseness, voice changes, sore throats or tonsillitis.    Lymphatic:  Patient denies history of enlargement, inflammation, pain or suppuration.    Cardiovascular:  Patient denies history of palpations, heart murmur, irregular rhythm, chest pain, exertional dyspnea, cyanosis, ascites, rheumatic fever, cold extremities or edema.  Respiration:  Patient denies wheezing, dyspnea, nocturnal dyspnea, cough, hemoptysis, pleurisy, TB or asthma.   Known OSA on [place] 19/ 8 cm H20. 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   Gastrointestinal: Patient denies changes in appetite. No dysphagia, odynophagia or abdominal pain.  No hematochezia, melena, bowel habit changes or hemorrhoids.  No jaundice. GERD Gastic surgery in past with poor results.   Genitourinary:   Patient denies dysuria, frequency, urgency or incontinence.  Complains of difficulty in starting or stopping urinary stream.  Reports history of stones which is followed by [facility].  He has benign prostatic hypertrophy but no prostate cancer. Recent Biopsy.   Musculoskeletal: The patient denies history of arthritis or joint pains.  No loss of strength or dislocation.  Breasts:  No history of masses, lumps, pain, nipple changes or painful nipples.  Neurological: Patient denies vertigo, syncope.  No twitching or memory.   Psychological: Patient denies moodiness, depression or anxiety.  No obsessions or hallucinations.    Endocrine:  No history of goiter, exophthalmos or dryness of skin.  No polyuria or polyphagia.  No diabetes.   Hematopoietic:  No history of bleeding disorders or easy bruising.  Rheumatic:  No polyarthritis or inflammatory joint disease. History of positive P-ANCA    PHYSICAL EXAMINATION:  Filed Vitals:    09/30/10  1313   BP: 138/74   Pulse: 74   Temp: 97.6 ??F (36.4 ??C)   TempSrc: Oral   Resp: 20   Height: 5\' 10"  (1.778 m)   Weight: 219 lb (99.338 kg)   SpO2: 95%     Constitutional: This is a well developed, well nourished 70 y.o.  male  who is alert, oriented, cooperative and in no apparent distress.  Head was normocephalic and atraumatic.  Male patten baldness.   EENT: Eyes were equal and reactive to light and accommodation.  Extraocular muscles intact.  No conjunctival injections.  External canals are patent. No discharge was appreciated.  Septum was mildly deviated, mucosa was without erythema, exudates or cobblestoning.  No thrush was noted.  Swollen turbinates.   Neck: Supple without thyromegaly. No elevated JVP. Trachea was midline. No carotid bruits.    Respiratory: Chest was symmetrical without dullness to percussion.  Breath sounds bilaterally were clear to auscultation. There were no wheezes, rhonchi or rales. No intercostal retraction or use of accessory muscles. No egophony noted.   Cardiovascular: Regular without murmur, clicks, gallops or rubs.  No left or right ventricular heave.    Pulses:  Carotid, radial and femoral pulses were equally bilaterally.    Abdomen: Slightly rounded, soft without organomegaly. No rebound, rigidity or guarding.  Surgical (+) scars  Lymphatic: No lymphadenopathy.  Musculoskeletal: Musculoskeletal strength shows the patient to ambulate without assistance.  Normal curvature of the spine.  No gross muscle weakness.    Extremities:  No lower extremity edema, ulcerations, tenderness, varicosities or erythema.  Muscle size, tone and strength are normal.  No involuntary movements are noted.  Coordination appears adequate.  Sensory function appears intact.  Deep tendon reflexes are normal. No rash.  Skin:  Warm and dry.  Good color, turgor and pigmentation. No lesions. Varicoties noted.  Neurological/Psychiatric: The patient's general behavior, level of consciousness, thought content and  emotional status is normal.  Cranial nerves II-XII are intact.      DATA:     Today Spirometry were compared to previous demonstrates an FVC of 3.29  which is 74 % of predicted with an FEV1 of  2.88 liters which is 88 % of predicted.  FEV1/FVC ratio is 88%.  Maximum voluntary ventilation is 128 liters per minute or 99 % of predicted.  Flow volume loop shows no signs of intrathoracic or extrathoracic obstruction. Impression. Improved and normalized airflow obstruction    Asthma Control Test [ACT] is a 5 part questionnaire, which has a point value system that ranges from 1- 5. This test is a validated questionnaire to help determine asthma control.  A total score of 19 or below indicates that the patients asthma may not be well controlled. If used in conjunction with PFTs, the correlation of determining asthma control is even stronger.  Today on testing the ACT was 19 out of max of 25. Impression: Poor control.     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 While we have never proven the systemic vasculitis or necrotizing vasculitis part and he has no renal, neuropathic or cardiovascular involvement; the finding of chronic sinusitis, eosinophilia and asthma aid in the diagnosis.  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 He has no neuropathy or neuropathic mononeuritis. His P ANCA is 1:160 but fluctuates and on last check was normal.  However, the clinical utility of ANCA in CSS raises a controversial issue of the sensitivity and specificity of ANCA in systemic vasculitis. In CSS P-ANCA is 50%.  He should never receive leukotriene modifier, allopurinol, propylthiouracil and quinine with this history of CSS for it will cause active flairs?.     His chronic allergies, sinusitis with rhinitis and nasal  polyposis is stable after surgery. He also has a failed Nissen fundoplication with  chronic gastroparesis and GERD stable based on last. Currently he is doing fairly well with Xolair, nasal steroids and antihistamines. He uses the QVAR, one puff B.I.D. & his PFTs are stable. The only current issue is his sleep apnea symptoms despite adjusted BiPAP.  With this in mind, I would like to proceed with the following;                PLAN:    At this time, I would continue olopatadine & Xolair for his allergies and elevated IgE level. We will need to repeat the P-ANCA levels. 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  His asthma/reactive airways are has really declined do to his granddaughters illness, so we are going to continue the Xopenex inhaler PRN; QVAR 1 puff BID. Sleep apnea symptoms are still moderate despite adjusting Bilevel therapy.  We refilled his medications and will update you once the testing is completed. He will follow up in 4- 6 month for re-evaluation.     We hope this updates you on my evaluation and clinical thinking. Thank you for allowing [place] to participate in [name] care.     Sincerely,        Elmer Ramp, D.O., [facility], [facility]  Associate Professor of Medicine  Director, [facility]

## 2010-10-01 NOTE — Patient Instructions (Signed)
The Connection Between Allergies and Asthma        Because asthma and allergies are so common and frequently occur together, most parents may want to know about preventing or avoiding these conditions.   Allergy Insight   Allergen is the word that doctors use to describe a substance in the environment to which our bodies may react with an allergic or asthmatic reaction. Common allergens include pollen, mold, dust mites, latex, certain foods , bee stings , certain plants , and medicines.   We are all exposed to at least some allergens all the time. But, many of us can encounter these troublemakers without experiencing any symptoms at all. For most people, their body simply does not react to allergens. However, for millions of people, an excessive immune response to allergens triggers a cascade of unpleasant symptoms. These symptoms are sometimes mild, but they can be severe, or rarely, even fatal. Allergic symptoms most commonly include: itching of the eyes, throat, or skin; sneezing; nasal congestion; coughing; wheezing; or rash.   Typically, allergic substances enter the body in one or more of the following ways:   ?? Absorption through the skin (eg, poison ivy )   ?? Inhalation through the mouth or nose (eg, pollen, dust mites)   ?? Ingestion (eg, foods, medicines)   ?? Injection (eg, insect sting)   Asthma Insight   Asthma is a condition in which the lungs react to some kind of irritation by producing mucous and inflammation along your breathing pathway. This reaction may occur moments after exposure to an irritant or after several hours have passed. Allergy is a common cause of asthmatic reactions, but similar symptoms can be produced by non-allergen sources (eg, irritant chemicals, viral infections, or other lung irritants). Asthma is usually controllable with treatment. In between attacks, or after treatment, the lungs return almost completely to normal. An asthma episode usually includes difficulty breathing,  shortness of breath, cough, or other respiratory symptoms.   Exposure to tobacco smoke may trigger asthma in children as smoke is an irritant. Other triggers include exercise, cold air, viral infections, and allergens. The allergens that most commonly cause an asthma episode are dust mites, mold, pollen, and animal dander. Food allergies can also trigger an asthma episode in some people. Foods like shellfish and peanuts can be asthma triggers.   The Allergy-Asthma Connection   It is possible for your children to have allergies but not asthma, or to have asthma without allergies. But, the two conditions often occur together. Eczema and hay fever are common allergies associated with asthma.   For some people, the connection between these conditions lies in the similar biologic responses they provoke to what are, for the most part, harmless environmental triggers. If you have allergies and/or asthma, your body is attempting to protect itself from substances it perceives to be dangerous. Unfortunately, this protective reaction triggers the release of body chemicals that cause results like sneezing, congestion, itchy red eyes, skin rash and/or wheezing, shortness of breath, and cough. With allergic asthma, the allergic reaction is confined to the airways, whereas other forms of allergy may affect the skin, eyes, or ears.   Putting Knowledge Into Action   You cannot change your child's genetics, but you can do a number of things to safeguard your home and family against allergies and asthma. While developing allergies and/or asthma may be inevitable for some, following these tips may lessen the severity and frequency of episodes for people who are at high risk:   ??   Control exposure to smokeDo not smoke at all. But, if you must smoke do so outside. Never smoke in a car that children ride in, even if your child is not in the car at the time. Wood smoke may also be an asthma risk; avoid wood heating. It may also be wise to  assure that gas heaters and stoves are vented to the outside. These appliances produce combustion products that can irritate the lungs.   ?? Control exposure to petsThis is often debated as the evidence is inconsistent. In some studies, exposure to pets at a young age was associated with less risk of allergies.   ?? Control exposure to dust mitesDust mites are microscopic creatures that are found in large quantities in your home. They tend to live in bedding, but are far too small to be seen. Strategies to reduce exposure to mites include:   ?? Wash all linens in hot water every seven days.   ?? Place zippered, plastic covers on pillows and mattresses. Although this often recommended, there is little evidence that this actually helps.   ?? Vacuum carpeting and upholstered furniture frequently using a vacuum cleaner with a HEPA filter.   ?? Keep indoor relative humidity below 50%.   There are other exposures you might want to avoid. Be aware that latex paints, chipboard furniture, and some rugs may release certain chemicals that can cause wheezing in children. If you can, choose to live away from busy highways. This will reduce any risk from automobile and truck exhaust.   Other sources of allergies include cockroaches, rodents, and mold. Careful cleaning of bathrooms and repairing leaky pipes can help reduce mold from growing.   Food is an important trigger for some children. Breastfeeding may help reduce the incidence of allergies, as well as asthma.   Knowing the underlying types, causes, and triggers of both asthma and allergies is the foundation of putting effective prevention and treatment strategies into action.     Last Reviewed: July 2010 Brian Randall, MD   Updated: 01/07/2009

## 2011-02-13 LAB — SEDIMENTATION RATE, MANUAL: Sed Rate: 7 mm/hr (ref 0–15)

## 2011-02-13 NOTE — Progress Notes (Signed)
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     HISTORY OF PRESENT ILLNESS:    Julian Castaneda is a 70 y.o. year old male who has a long standing history of allergies, elevated Ig E level around 600 requiring the administration of Xolair therapy. 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 Last level in 06/2010 was negative. The her recall He under when endoscopic surgery on 06/30/07 antrostomy bilateral with ethmoidectomies and left single sphenoidotomies and septoplasty. His treatment is working well, which included nasal rinses, Patanase, Astelin, and Xolair. I will  With his sleep apnea he is very compliant using the BiPAP machine nightly for at least 6 hours. importunely he still is having complaints of fatigue, irritability and memory morning problems . His current pressure are 18/ 9 cm H20.     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 He has been spending a great deal of time at her bedside and because of such has not been very compliant with his medication or caring for himself very well. Because of this his PFTs declined. He is using QVAR along with Xopenex as needed. His PANCA and CANA were negative 02/2010. His ACT is 19/25 indicating a decline in control. He has no CP, SOB or leg edema. No neurologic symptoms. He has long standing GERD requiring anti-reflux surgery years ago.     His is receiving his Xolair immunotherapy which has helped. His GERD and  cough was an issue but with a trial course of a PPI for reflux and repeat EGD which was negative by Dr. Lavone Nian he has had no repeat complaints. His antireflux surgery anastomosis was stable and clear. He is still not sleeping well despite adjusting his BiPAP to the higher pressure. He was also found to have a elevated PSA and a recent biopsy done of the prostate which was benign. We will need to follow his P-ANCA levels & SED rate. His SED rate was 7. Since the last visit he lost his taste and smelling and stop the inhalers and the symptoms resolved. His PFTs are at stable at 87% with and FEV1 of 2.64 liters/  We asked him to take care of his health and resume his previous treatment schedule. He is having a hard time with the illness of his Castaneda. He will return in a 1 year. (his request).       ALLERGIES:  Allergies   Allergen Reactions   . Nexium (Esomeprazole Magnesium Trihydrate) Swelling   . Nsaids Swelling   . Reglan (Metoclopramide Hcl) Other (See Comments)     States has bloating with this       PAST MEDICAL HISTORY:     Diagnosis Date   . Allergy    . Sinusitis    . GERD (gastroesophageal reflux disease)    . Hyperlipidemia    . Sleep apnea      CHM 18/9   . Diabetes mellitus    .  Asthma    . CSS (Churg-Strauss syndrome)    . Kidney stone    . Elevated PSA      negative biospy        MEDICATIONS: Current Outpatient Prescriptions   Medication Sig Dispense Refill   . hydrochlorothiazide (HYDRODIURIL) 25 MG tablet Take 25 mg by mouth daily.         . Sodium Chloride-Sodium Bicarb (AYR SALINE NASAL RINSE NA) 1 applicator by Nasal route daily.         Marland Kitchen omalizumab (XOLAIR) 150 MG injection 375mg  subcut every two weeks.  Pt receives injections at [facility] infusion center  375 mg  24   . enalapril (VASOTEC) 5 MG tablet Take 5 mg by mouth daily.         Marland Kitchen atorvastatin (LIPITOR) 80 MG tablet Take 80 mg by mouth daily.       . fluticasone-salmeterol (ADVAIR HFA) 230-21 MCG/ACT inhaler Inhale 1 puff into the lungs  daily.         Marland Kitchen FLUTICASONE FUROATE NA 2 sprays by Nasal route daily.         Marland Kitchen EPINEPHrine (EPIPEN 2-PAK) 0.3 MG/0.3ML DEVI injection Inject 0.3 mg into the muscle once as needed.         Marland Kitchen EPINEPHrine (EPIPEN 2-PAK) 0.3 MG/0.3ML DEVI injection Inject 0.3 mLs into the muscle once as needed for 1 dose.  1 Device  3   . azelastine (ASTELIN) 137 MCG/SPRAY nasal spray 2 sprays by Nasal route daily. Use in each nostril as directed       . Cholecalciferol 400 UNIT TABS tablet Take 1,000 Units by mouth daily.       . fish oil-omega-3 fatty acids 1000 MG capsule Take 2 g by mouth daily.       Marland Kitchen olopatadine (PATANASE) 0.6 % SOLN nassl soln 2 sprays by Nasal route 2 times daily.  3 Bottle  4   . XOPENEX HFA 45 MCG/ACT inhaler Inhale 1 puff into the lungs every 8 hours as needed for Wheezing.  1 Inhaler  12       SOCIAL AND OCCUPATIONAL HEALTH:  He is married with four children. The patient is a never smoked  There is not history of TB or TB exposure.  No Etoh Abuse.  There is not asbestos or silica dust exposure.  The patient reports no coal, foundry, quarry or [employer] exposure.  Travel history reveals no trips.  Denies history of recreational or IV drug use. Denies hot tub exposure. The patient has no pets. Hobbies include watching soap opera.    History   Substance Use Topics   . Smoking status: Never Smoker    . Smokeless tobacco: Never Used   . Alcohol Use: No       SURGICAL HISTORY: Past Surgical History   Procedure Date   . Gastric fundoplication    . Septoplasty    . Hernia repair    . Nasal polyp surgery    . Prostate biopsy 2011     negative pathology   . Upper gastrointestinal endoscopy 12/2009     with biopsy, pathology negative   . Tumor removal 11/2010     fatty tumor removed Dr Lavone Nian       FAMILY HISTORY: Family History   Problem Relation Age of Onset   . High Blood Pressure Father    . High Cholesterol Father    . Asthma Sister    . Diabetes Sister  REVIEW OF SYSTEMS:   Constitutional: General health  good . No weight changes.  No fevers, fatigue or weakness.   Head: Patient denies any history of trauma, convulsive disorder or syncope.    Skin:  Patient denies history of changes in pigmentation, eruptions or pruritus.  No easy bruising or bleeding.  EENT: Patient denies any history of color blindness, photophobia, diplopia, inflammation, cataracts or glaucoma.   Patient denies history of deafness, tinnitus, pain, discharge or recurrent infections.  Patient has a long history of rhinitis, chronic nasal discharge, drainage, & nasal polyps; all requiring surgery. Patient denies history of soreness of mouth or tongue. Patient denies history of hoarseness, voice changes, sore throats or tonsillitis.    Lymphatic:  Patient denies history of enlargement, inflammation, pain or suppuration.    Cardiovascular:  Patient denies history of palpations, heart murmur, irregular rhythm, chest pain, exertional dyspnea, cyanosis, ascites, rheumatic fever, cold extremities or edema.  Respiration:  Patient denies wheezing, dyspnea, nocturnal dyspnea, cough, hemoptysis, pleurisy, TB or asthma.   Known OSA on Blevel 19/ 8 cm H20. 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   Gastrointestinal: Patient denies changes in appetite. No dysphagia, odynophagia or abdominal pain.  No hematochezia, melena, bowel habit changes or hemorrhoids.  No jaundice. GERD Gastic surgery in past with poor results.   Genitourinary:   Patient denies dysuria, frequency, urgency or incontinence.  Complains of difficulty in starting or stopping urinary stream.  Reports history of stones which is followed by Julian Castaneda.  He has benign prostatic hypertrophy but no prostate cancer. Recent Biopsy.   Musculoskeletal: The patient denies history of arthritis or joint pains.  No loss of strength or dislocation.  Breasts:  No history of masses, lumps, pain, nipple changes or painful nipples.  Neurological: Patient denies vertigo, syncope.  No twitching or memory.    Psychological: Patient denies moodiness, depression or anxiety.  No obsessions or hallucinations.    Endocrine:  No history of goiter, exophthalmos or dryness of skin.  No polyuria or polyphagia.  No diabetes.   Hematopoietic:  No history of bleeding disorders or easy bruising.  Rheumatic:  No polyarthritis or inflammatory joint disease. History of positive P-ANCA    PHYSICAL EXAMINATION:  Filed Vitals:    02/13/11 1542   BP: 140/72   Pulse: 58   Temp: 97.8 F (36.6 C)   TempSrc: Oral   Resp: 16   Height: 5\' 8"  (1.727 m)   Weight: 211 lb (95.709 kg)   SpO2: 98%     Constitutional: This is a well developed, well nourished 70 y.o.  male  who is alert, oriented, cooperative and in no apparent distress.  Head was normocephalic and atraumatic.    Male patten baldness.   EENT: Eyes were equal and reactive to light and accommodation.  Extraocular muscles intact.  No conjunctival injections.  External canals are patent.   No discharge was appreciated.  Septum was mildly deviated, mucosa was without erythema, exudates or cobblestoning.  No thrush was noted.  Swollen turbinates.   Neck: Supple without thyromegaly. No elevated JVP. Trachea was midline. No carotid bruits.    Respiratory: Chest was symmetrical without dullness to percussion.  Breath sounds bilaterally were clear to auscultation.   No wheezes, rhonchi or rales. No intercostal retraction or use of accessory muscles. No egophony noted.   Cardiovascular: Regular without murmur, clicks, gallops or rubs.  No left or right ventricular heave.    Pulses:  Carotid, radial and femoral pulses were  equally bilaterally.    Abdomen: Slightly rounded, soft without organomegaly. No rebound, rigidity or guarding.  Surgical (+) scars  Lymphatic: No lymphadenopathy.  Musculoskeletal: Musculoskeletal strength shows the patient to ambulate without assistance.  Normal curvature of the spine.    No gross muscle weakness.    Extremities:  No lower extremity edema, ulcerations,  tenderness, varicosities or erythema.  Muscle size, tone and strength are normal.    No involuntary movements are noted.  Coordination appears adequate.  Sensory function appears intact.  Deep tendon reflexes are normal. No rash.   Skin:  Warm and dry.  Good color, turgor and pigmentation. No lesions. Varicoties noted.  Neurological/Psychiatric: The patient's general behavior, level of consciousness, thought content and emotional status is normal.  Cranial nerves II-XII are intact.      DATA:     Today Spirometry were compared to previous demonstrates an FVC of 3.24  which is 79 % of predicted with an FEV1 of  2.64 liters which is 87 % of predicted.  FEV1/FVC ratio is 81%.  Maximum voluntary ventilation is 128 liters per minute or 99 % of predicted.  Flow volume loop shows no signs of intrathoracic or extrathoracic obstruction. Impression. Normal.    Asthma Control Test [ACT] is a 5 part questionnaire, which has a point value system that ranges from 1- 5. This test is a validated questionnaire to help determine asthma control.  A total score of 19 or below indicates that the patients asthma may not be well controlled. If used in conjunction with PFTs, the correlation of determining asthma control is even stronger.  Today on testing the ACT was 24 out of max of 25. Impression: Poor control.     Epworth Sleepiness Scale is use this Tool to Measure Sleep Deprivation. The Epworth Sleepiness Scale was developed by researchers in [place] and is widely used by sleep professionals around the world to measure sleep deprivation.  How likely are you to doze off or fall asleep in the following situations, in contrast to feeling just tired? This refers to your usual way of life in recent times.  Results: Total Epworth Score: 9  Interpretation: No hypersomnolence.    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 While we have never proven the systemic vasculitis or necrotizing vasculitis  part and he has no renal, neuropathic or cardiovascular involvement; the finding of chronic sinusitis, eosinophilia, pANCA (+) and asthma aid in the diagnosis.  With well controlled asthma contributed from chronic sinusitis and upper airway problems all consistent with Julian Castaneda Syndrome. He has no neuropathy or neuropathic mononeuritis. His P ANCA is 1:160 but fluctuates and on last check & this check it was normal.  However, the clinical utility of ANCA in CSS raises a controversial issue of the sensitivity and specificity of ANCA in systemic vasculitis. In CSS P-ANCA is 50%.  He should never receive leukotriene modifier, allopurinol, propylthiouracil and quinine with this history of CSS for it will cause active flairs?.     His chronic allergies, sinusitis with rhinitis and nasal  polyposis is stable after surgery. He also has a failed Nissen fundoplication with chronic gastroparesis and GERD stable based on last. Currently he is doing fairly well with Xolair, nasal steroids and antihistamines. He uses the QVAR, one puff B.I.D. & his PFTs are stable. The only current issue is his sleep apnea symptoms despite adjusted BiPAP.  With this in mind, I would  like to proceed with the following;                PLAN:    At this time, I would continue  Xolair for his allergies and elevated IgE level. We will need to repeat the P-ANCA levels. 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  His asthma/reactive airways are has really declined do to his granddaughters illness, so we are going to continue the Xopenex inhaler.   Sleep apnea symptoms are still moderate despite adjusting BiLevel therapy.  We refilled his medications and will update you once the testing is completed. He will follow up in one year for re-evaluation.  We hope this updates you on my evaluation and clinical thinking. Thank you for allowing [place] to participate in Julian Castaneda care.     Sincerely,        Julian Castaneda, D.O., [facility],  [facility]  Associate Professor of Medicine  Director, [facility]

## 2011-02-13 NOTE — Progress Notes (Signed)
Pt to follow up in 1 year. Pt to have a chest xray prior to next visit. Will call with any questions or concerns.

## 2011-02-17 LAB — ANTI-NEUTROPHILIC CYTOPLASMIC ANTIBODY
Myeloperoxidase Ab: 0 AU/mL (ref 0–19)
Serine Protease 3 Ab: 0 AU/mL (ref 0–19)

## 2011-08-10 NOTE — Progress Notes (Signed)
ERROR

## 2011-09-04 ENCOUNTER — Encounter

## 2011-09-04 MED ORDER — EPINEPHRINE 0.3 MG/0.3ML IJ DEVI
0.3 MG/ML | Freq: Once | INTRAMUSCULAR | Status: DC | PRN
Start: 2011-09-04 — End: 2014-01-16

## 2011-11-25 NOTE — Progress Notes (Signed)
Julian Castaneda, Julian Castaneda                                 [id number]      DATE OF SERVICE:  11/21/2011      PULMONOLOGIST:  Elmer Ramp DO, FCCP FACOI                                     CPAP TITRATION      REFERRING PHYSICIAN:  Lonzo Cloud. [name], M.D.      INDICATIONS:  Known sleep apnea, Respironics medium gel mask with   humidification, retitration ordered. The patient slept in the recliner as he   does at home.  A 71 year old, BMI of 31.      SLEEP SUMMARY:  Total time in bed 374 minutes.  Total sleep time 363 minutes.   Sleep onset 5.5 minutes.  Sleep efficiency 96%, 36 arousals, 8 awakenings.      SLEEP STAGES:  Seven percent in N1, 50% in N2, 24% in N3, 19% in REM.      RESPIRATORY SUMMARY:   There were 22 apneas, 6 hypopneas, index normalized at   4.6.      OXYGENATION:  Mean awake saturation 93%, minimum saturation 94%.      ELECTROCARDIOGRAM:  Awake heart rate was 61 bpm.  Steady average sleep heart   rate was 52 bpm.  There were 6 PACs noted.      LEG MOVEMENT SUMMARY:  There were 114 leg movements, 75 periodic limb   movements with a leg movement of 18.3.      TECHNICAL COMMENTS:  The patient used a Respironics medium gel nasal mask.   Retitration ordered.  The patient slept in a recliner.  The patient elected   to start CPAP therapy at 8 cm of water pressure.  No bruxisms identified.   All stages of sleep observed.  CPAP titration of 8 cm of water pressure   increased to 12 with total sleep time 169 minutes, 41 minutes in REM and an   index that normalized to 3.5 and no nocturnal hypoxemia.      IMPRESSION:   1.    Obesity.   2.    Known sleep apnea.   3.    Leg movement disorder.      RECOMMENDATIONS:  CPAP at 12 cm of water pressure using a Respironics medium   gel mask with humidification.            Dictated by:  Elmer Ramp, DO,FCCP,FACOI,FACP         TB / nmt/psk   DD: 11/24/2011    5:11 P    DT: 11/25/2011  9:55 A   [id number]    [id number]   CC:   [name], M.D.         Elmer Ramp DO, [facility] Edsel Petrin

## 2012-04-05 ENCOUNTER — Encounter

## 2012-04-05 NOTE — Telephone Encounter (Signed)
Notified patient that CPAP supplies order was faxed to [facility]. Also notified patient that a follow up will be made for him as his 1 year appointment wasn't made. Patient prefers to be called to schedule appointment so that he can check his calendar at that time.

## 2012-04-05 NOTE — Telephone Encounter (Signed)
Message copied by Michail Jewels on Tue Apr 05, 2012 12:23 PM  ------       Message from: Rolin Barry       Created: Mon Apr 04, 2012  3:33 PM       Regarding: change in supplier       Contact: [phone]         Pt called to say his supplier lost the bid so he is no longer covered for his supplies. He needs a new [facility] so he can continue getting them.              Fax [phone]  ------

## 2012-08-16 LAB — POCT NITRIC OXIDE

## 2012-08-16 NOTE — Progress Notes (Signed)
[facility]    Department of Internal Medicine  Division of Pulmonary, Critical Care & Sleep Medicine    [name], MD    We had the pleasure of seeing  Julian Castaneda in the [facility] regarding his chronic sinusitis, reactive airway disease & obstructive sleep apnea.     HISTORY OF PRESENT ILLNESS:    Julian Castaneda is a 72 y.o. year old male who has a long standing history of allergies, elevated Ig E level around 600 requiring the administration of Xolair therapy. 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 Last level in 06/2010 was negative. The her recall He under when endoscopic surgery on 06/30/07 antrostomy bilateral with ethmoidectomies and left single sphenoidotomies and septoplasty. His treatment is working well, which included nasal rinses, Patanase, Astelin, and Xolair. I will  With his sleep apnea he is very compliant using the BiPAP machine nightly for at least 6 hour. His current pressure are 18/ 9 cm H20.     [name] granddaughter remains in a coma. He has been spending a great deal of time at her bedside and because of such has not been very compliant with his medication or caring for himself very well. His ACT is 19/25 indicating a decline in control. He has no CP, SOB or leg edema. No neurologic symptoms. He has long standing GERD requiring anti-reflux surgery years ago. His is receiving his Xolair immunotherapy which has helped. Repeat EGD which was negative by Dr. Lavone Nian he has had no repeat complaints. His antireflux surgery anastomosis was stable and clear. He was also found to have a elevated PSA and a recent biopsy done of the prostate which was benign.  His SED rate was 7. Since the last visit he lost his taste and  smelling and stop the inhalers and the symptoms resolved. His PFTs are at stable at 72%.  He will return in a 1 year. (his request).       ALLERGIES:  Allergies   Allergen Reactions   ??? Nexium (Esomeprazole Magnesium Trihydrate) Swelling   ??? Nsaids Swelling   ??? Reglan (Metoclopramide Hcl) Other (See Comments)     States has bloating with this       PAST MEDICAL HISTORY:     Diagnosis Date   ??? Allergy    ??? Sinusitis    ??? GERD (gastroesophageal reflux disease)    ??? Hyperlipidemia    ??? Sleep apnea      CHM 18/9   ??? Diabetes mellitus    ??? Asthma    ??? CSS (Churg-Strauss syndrome)    ??? Elevated PSA      negative biospy   ??? Kidney stone         MEDICATIONS: Current Outpatient Prescriptions   Medication Sig Dispense Refill   ??? VITAMIN E PO Take  by mouth.       ??? EPINEPHrine (EPIPEN 2-PAK) 0.3 MG/0.3ML DEVI injection Inject 0.3 mLs into the muscle once as needed for 1 dose.  1 Device  3   ??? hydrochlorothiazide (HYDRODIURIL) 25 MG tablet Take 25 mg by mouth daily.         ??? Sodium Chloride-Sodium Bicarb (AYR SALINE NASAL RINSE NA) 1 applicator by Nasal route daily.         ???  omalizumab (XOLAIR) 150 MG injection 375mg  subcut every two weeks.  Pt receives injections at [facility] infusion center  375 mg  24   ??? enalapril (VASOTEC) 5 MG tablet Take 5 mg by mouth daily.         ??? atorvastatin (LIPITOR) 80 MG tablet Take 80 mg by mouth daily.       ??? Cholecalciferol 400 UNIT TABS tablet Take 2,000 Units by mouth daily.       ??? fluticasone-salmeterol (ADVAIR HFA) 230-21 MCG/ACT inhaler Inhale 1 puff into the lungs daily.         ??? FLUTICASONE FUROATE NA 2 sprays by Nasal route daily.         ??? azelastine (ASTELIN) 137 MCG/SPRAY nasal spray 2 sprays by Nasal route daily. Use in each nostril as directed       ??? fish oil-omega-3 fatty acids 1000 MG capsule Take 2 g by mouth daily.       ??? olopatadine (PATANASE) 0.6 % SOLN nassl soln 2 sprays by Nasal route 2 times daily.  3 Bottle  4   ??? XOPENEX HFA 45 MCG/ACT inhaler Inhale 1 puff into the  lungs every 8 hours as needed for Wheezing.  1 Inhaler  12     No current facility-administered medications for this visit.       SOCIAL AND OCCUPATIONAL HEALTH:  He is married with four children. The patient is a never smoked  There is not history of TB or TB exposure.  No Etoh Abuse.  There is not asbestos or silica dust exposure.  The patient reports no coal, foundry, quarry or [employer] exposure.  Travel history reveals no trips.  Denies history of recreational or IV drug use. Denies hot tub exposure. The patient has no pets. Hobbies include watching soap opera.    History   Substance Use Topics   ??? Smoking status: Never Smoker    ??? Smokeless tobacco: Never Used   ??? Alcohol Use: No       SURGICAL HISTORY: Past Surgical History   Procedure Laterality Date   ??? Gastric fundoplication     ??? Septoplasty     ??? Hernia repair     ??? Nasal polyp surgery     ??? Prostate biopsy  2011     negative pathology   ??? Upper gastrointestinal endoscopy  12/2009     with biopsy, pathology negative   ??? Tumor removal  11/2010     fatty tumor removed Dr Lavone Nian       FAMILY HISTORY: Family History   Problem Relation Age of Onset   ??? High Blood Pressure Father    ??? High Cholesterol Father    ??? Asthma Sister    ??? Diabetes Sister        REVIEW OF SYSTEMS:   Constitutional: General health good . No weight changes.  No fevers, fatigue or weakness.   Head: Patient denies any history of trauma, convulsive disorder or syncope.    Skin:  Patient denies history of changes in pigmentation, eruptions or pruritus.  No easy bruising or bleeding.  EENT: Patient denies any history of color blindness, photophobia, diplopia, inflammation, cataracts or glaucoma.   Patient denies history of deafness, tinnitus, pain, discharge or recurrent infections.    Patient has a long history of rhinitis, chronic nasal discharge, drainage, & nasal polyps; all requiring surgery. Patient denies history of soreness of mouth or tongue.   Patient denies history of hoarseness,  voice  changes, sore throats or tonsillitis.    Lymphatic:  Patient denies history of enlargement, inflammation, pain or suppuration.    Cardiovascular:  Patient denies history of palpations, heart murmur, irregular rhythm, chest pain, exertional dyspnea, cyanosis, ascites, rheumatic fever, cold extremities or edema.  Respiration:  Patient denies wheezing, dyspnea, nocturnal dyspnea, cough, hemoptysis, pleurisy, TB or asthma.   Known OSA on [place] 19/ 8 cm H20. 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   Gastrointestinal: Patient denies changes in appetite. No dysphagia, odynophagia or abdominal pain.  No hematochezia, melena, bowel habit changes or hemorrhoids.    No jaundice. GERD Gastic surgery in past with poor results.   Genitourinary:   Patient denies dysuria, frequency, urgency or incontinence.  Complains of difficulty in starting or stopping urinary stream.    Reports history of stones which is followed by [facility].  He has benign prostatic hypertrophy but no prostate cancer. Recent Biopsy.   Musculoskeletal: The patient denies history of arthritis or joint pains.  No loss of strength or dislocation.  Breasts:  No history of masses, lumps, pain, nipple changes or painful nipples.  Neurological: Patient denies vertigo, syncope.  No twitching or memory.   Psychological: Patient denies moodiness, depression or anxiety.  No obsessions or hallucinations.    Endocrine:  No history of goiter, exophthalmos or dryness of skin.  No polyuria or polyphagia.  No diabetes.   Hematopoietic:  No history of bleeding disorders or easy bruising.  Rheumatic:  No polyarthritis or inflammatory joint disease. History of positive P-ANCA    PHYSICAL EXAMINATION:  Filed Vitals:    08/16/12 1420   BP: 126/71   Pulse: 77   Temp: 98 ??F (36.7 ??C)   TempSrc: Oral   Resp: 16   Height: 5\' 10"  (1.778 m)   Weight: 220 lb (99.791 kg)   SpO2: 94%     Constitutional: A well developed, well nourished 72 y.o.  male  who is alert, oriented,  cooperative and in no apparent distress.  Head was normocephalic and atraumatic.    Male patten baldness.   EENT: Eyes were equal and reactive to light and accommodation.  Extraocular muscles intact.  No conjunctival injections.  External canals are patent.   No discharge was appreciated.  Septum was mildly deviated, mucosa was without erythema, exudates or cobblestoning.  No thrush was noted.  Swollen turbinates.   Neck: Supple without thyromegaly. No elevated JVP. Trachea was midline. No carotid bruits.    Respiratory: Chest was symmetrical without dullness to percussion.  Breath sounds bilaterally were clear to auscultation.   No wheezes, rhonchi or rales. No intercostal retraction or use of accessory muscles. No egophony noted.   Cardiovascular: Regular without murmur, clicks, gallops or rubs.  No left or right ventricular heave.    Pulses:  Carotid, radial and femoral pulses were equally bilaterally.    Abdomen: Slightly rounded, soft without organomegaly. No rebound, rigidity or guarding.  Surgical (+) scars  Lymphatic: No lymphadenopathy.  Musculoskeletal: Musculoskeletal strength shows the patient to ambulate without assistance.  Normal curvature of the spine.    No gross muscle weakness.    Extremities:  No lower extremity edema, ulcerations, tenderness, varicosities or erythema.  Muscle size, tone and strength are normal.    No involuntary movements are noted.  Coordination appears adequate.  Sensory function appears intact.  Deep tendon reflexes are normal. No rash.   Skin:  Warm and dry.  Good color, turgor and pigmentation. No lesions. Varicoties  noted.  Neurological/Psychiatric: The patient's general behavior, level of consciousness, thought content and emotional status is normal.  Cranial nerves II-XII are intact.      DATA:     Today Spirometry compared to previous demonstrates an FVC of 2.87  which is 66 % of predicted with an FEV1 of  2.23 liters which is 72 % of predicted.  FEV1/FVC ratio is 80%.   Maximum voluntary ventilation is 128 liters per minute or 99 % of predicted.  Flow volume loop shows no signs of intrathoracic or extrathoracic obstruction. Impression. Normal.    Asthma Control Test [ACT] is a 5 part questionnaire, which has a point value system that ranges from 1- 5. This test is a validated questionnaire to help determine asthma control.  A total score of 19 or below indicates that the patients asthma may not be well controlled. If used in conjunction with PFTs, the correlation of determining asthma control is even stronger.  Today on testing the ACT was 21 out of max of 25. Impression: Poor control.     Epworth Sleepiness Scale is use this Tool to Measure Sleep Deprivation. The Epworth Sleepiness Scale was developed by researchers in [place] and is widely used by sleep professionals around the world to measure sleep deprivation.  How likely are you to doze off or fall asleep in the following situations, in contrast to feeling just tired? This refers to your usual way of life in recent times.  Results: Total Epworth Score: 3  Interpretation: No hypersomnolence.    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 While we have never proven the systemic vasculitis or necrotizing vasculitis part and he has no renal, neuropathic or cardiovascular involvement; the finding of chronic sinusitis, eosinophilia, pANCA (+) and asthma aid in the diagnosis.  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 He has no neuropathy or neuropathic mononeuritis. His P ANCA is 1:160 but fluctuates and on last check & this check it was normal.  However, the clinical utility of ANCA in CSS raises a controversial issue of the sensitivity and specificity of ANCA in systemic vasculitis. In CSS P-ANCA is 50%.  He should never receive leukotriene modifier, allopurinol, propylthiouracil and quinine with  this history of CSS for it will cause active flairs?.     His chronic allergies, sinusitis with rhinitis and nasal  polyposis is stable after surgery. He also has a failed Nissen fundoplication with chronic gastroparesis and GERD stable based on last. Currently he is doing fairly well with Xolair, nasal steroids and antihistamines. The only current issue is his sleep apnea symptoms despite adjusted BiPAP.  With this in mind, I would like to proceed with the following;                PLAN:    At this time, we would continue  Xolair for his allergies and elevated IgE level. We will need to repeat the P-ANCA levels. 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  His asthma/reactive airways are has really declined do to his granddaughters illness, so we are going to continue the Xopenex inhaler.   Sleep apnea symptoms are still moderate despite adjusting Bilevel therapy.  We refilled his medications and will update you once the testing is completed. He will follow up in one year for re-evaluation.  We hope this updates you on my evaluation  and clinical thinking. Thank you for allowing [place] to participate in [name] care.     Sincerely,        Elmer Ramp, D.O., [employer] [employer], FACP  Associate Professor of Medicine  Director, [facility]

## 2012-08-16 NOTE — Progress Notes (Signed)
Patient to see doctor in 1 year..  Patient given discharge summary and verbalized understanding.  Patient given ventolin inhalers samples and nasonex sample.

## 2012-08-16 NOTE — Patient Instructions (Addendum)
Learning About Asthma Triggers  What are triggers?  When you have asthma, certain things can make your symptoms worse. These are called triggers. They include:  ?? Cigarette smoke or air pollution.  ?? Things you are allergic to, such as:  ?? Pollen, mold, or dust mites.  ?? Pet hair, skin, or saliva.  ?? Illnesses, like colds, flu, or pneumonia.  ?? Exercise.  ?? Dry, cold air.  How do triggers affect asthma?  Triggers can make it harder for your lungs to work as they should and can lead to sudden difficulty breathing and other symptoms. When you are around a trigger, an asthma attack is more likely. If your symptoms are severe, you may need emergency treatment or have to go to the hospital for treatment.  If you know what your triggers are and can avoid them, you may be able to prevent asthma attacks, reduce how often you have them, and make them less severe.  What can you do to avoid triggers?  The first thing is to know your triggers.  When you are having symptoms, note the things around you that might be causing them. Then look for patterns in what may be triggering your symptoms. Record your triggers on a piece of paper or in an asthma diary. When you have your list of possible triggers, work with your doctor to find ways to avoid them.  You also can check how well your lungs are working by measuring your peak expiratory flow (PEF) throughout the day. Your PEF may drop when you are near things that trigger symptoms.  Here are some ways to avoid a few common triggers.  ?? Do not smoke or allow others to smoke around you. If you need help quitting, talk to your doctor about stop-smoking programs and medicines. These can increase your chances of quitting for good.  ?? If there is a lot of pollution, pollen, or dust outside, stay at home and keep your windows closed. Use an air conditioner or air filter in your home. Check your local weather report or newspaper for air quality and pollen reports.  ?? Get a flu shot every  year. Talk to your doctor about getting a pneumococcal shot. Wash your hands often to prevent infections.  ?? Avoid exercising outdoors in cold weather. If you are outdoors in cold weather, wear a scarf around your face and breathe through your nose.  How can you manage an asthma attack?  ?? If you have an asthma action plan, follow the plan. In general:  ?? Use your quick-relief inhaler as directed by your doctor. If your symptoms do not get better after you use your medicine, have someone take you to the emergency room. Call an ambulance if needed.  ?? If your doctor has given you other inhaled medicines or steroid pills, take them as directed.   Where can you learn more?   Go to [email or web address] and sign in to your MyChart account. Enter [phone] in the Search Health Information box to learn more about ???Learning About Asthma Triggers.???    If you do not have an account, please click on the ???Sign Up Now??? link.     ?? 2006-2013 [employer]. Care instructions adapted under license by [facility]. This care instruction is for use with your licensed healthcare professional. If you have questions about a medical condition or this instruction, always ask your healthcare professional. Health[employer], Inc[employer] disclaims any warranty or liability for your use of this information.  Content Version: 9.9.209917; Last Revised: August 07, 2010              COPD and Asthma: After Your Visit  Your Care Instructions  Some people who have chronic obstructive pulmonary disease (COPD) also have asthma. Both of these problems can damage your lungs. This makes it very important to control them.  Asthma causes the airways that lead to the lungs to swell and become narrow. This makes it hard to breathe. You may wheeze or cough. If you have a bad attack, you may need emergency care.  There are two parts to treating asthma.  ?? Controlling asthma over the long term.  ?? Treating attacks when they  occur.  You and your doctor can make an asthma treatment plan that will help. This plan tells you the medicines you take every day to reduce the swelling in your airways and prevent attacks. It also tells you what to do if you have an asthma attack.  Follow-up care is a key part of your treatment and safety. Be sure to make and go to all appointments, and call your doctor if you are having problems. It's also a good idea to know your test results and keep a list of the medicines you take.  How can you care for yourself at home?  To control asthma over the long term  Medicines  Controller medicines reduce swelling in your lungs. They also prevent asthma attacks. Take your controller medicine exactly as prescribed. Talk to your doctor if you have any problems with your medicine.  ?? Inhaled corticosteroid is a common and effective controller medicine. Using it the right way can prevent or reduce most side effects.  ?? Take your controller medicine every day, not just when you have symptoms. This helps prevent problems before they occur.  ?? Always bring your asthma medicine with you when you travel.  ?? Your doctor may prescribe long-acting medicine that combines a corticosteroid with a beta2-agonist. Follow your doctor's instructions exactly about how to take a long-acting medicine. Examples include:  ?? Fluticasone and salmeterol (Advair).  ?? Budesonide and formoterol (Symbicort).  ?? Do not depend on your controller medicines to stop an asthma attack that has already started. They do not work fast enough to help.  ?? Your doctor may also prescribe anticholinergic inhalers. These include ipratropium (Atrovent) and tiotropium (Spiriva).  Education  ?? Learn what sets off an asthma attack. Avoid these triggers when you can. Common triggers include smoke, air pollution, pollen, animal dander, colds, stress, and cold air.  ?? Do not smoke. Smoking can make COPD and asthma worse. If you need help quitting, talk to your doctor  about stop-smoking programs and medicines. These can increase your chances of quitting for good.  ?? You may want to learn how to use a peak flow meter. This measures how open your airways are. It may help you know when you will have an asthma attack.  To treat attacks when they occur  Use your asthma action plan when you have an attack. Your quick-relief medicine, such as albuterol, will stop an asthma attack. It relaxes the muscles that get tight around the airways.  ?? Take your quick-relief medicine exactly as prescribed. Talk with your doctor if you have any problems with your medicine.  ?? Keep this medicine with you at all times.  ?? You may need to use this medicine before you exercise.  When should you call for help?  Call 911 anytime  you think you may need emergency care. For example, call if:  ?? You have severe trouble breathing.  Call your doctor now or seek immediate medical care if:  ?? You have new or worse shortness of breath.  ?? You are coughing more deeply or more often, especially if you notice more mucus or a change in the color of your mucus.  ?? You cough up blood.  ?? You have new or increased swelling in your legs or belly.  ?? You have a fever.  ?? You have used your quick-relief medicine but you are still short of breath.  Watch closely for changes in your health, and be sure to contact your doctor if you have any problems.   Where can you learn more?   Go to [email or web address] and sign in to your MyChart account. Enter A350 in the Search Health Information box to learn more about ???COPD and Asthma: After Your Visit.???    If you do not have an account, please click on the ???Sign Up Now??? link.     ?? 2006-2013 [employer], [employer]. Care instructions adapted under license by [facility]. This care instruction is for use with your licensed healthcare professional. If you have questions about a medical condition or this instruction, always ask your healthcare  professional. [employer] disclaims any warranty or liability for your use of this information.  Content Version: 9.9.209917; Last Revised: August 10, 2011

## 2012-11-02 NOTE — Telephone Encounter (Signed)
Spoke with patient regarding doctor's request that the patient have his xolair injections performed at [facility]. [facility] instead of [place] [facility].  Patient was agreeable however he would like to keep his May 30th appointment and begin on Juine 13th.  The infusion center was left a message with this request.

## 2012-11-09 ENCOUNTER — Encounter

## 2012-11-09 NOTE — Telephone Encounter (Signed)
Spoke to patient in regards to his concerns or his BIPAP malfunctioning. Informed pt that an RX was sent over to [facility] to check & repair machine if needed and that he needed to make an appt with their therapist and bring the BIPAP machine mask and tubing with him.     Also gave him his Xolair infusion appt of 11/25/12 at 10am    Pt verbally understood

## 2012-11-25 MED ADMIN — omalizumab (XOLAIR) injection 375 mg: SUBCUTANEOUS | @ 16:00:00 | NDC 50242004062

## 2012-11-25 MED FILL — XOLAIR 150 MG SC SOLR: 150 mg | SUBCUTANEOUS | Qty: 450

## 2012-11-25 NOTE — Plan of Care (Signed)
Patient admitted for Xolair injection; has been getting at [facility] for the past 3 years.

## 2012-11-25 NOTE — Plan of Care (Signed)
[facility] MDRU Xolair Allergy Control Test    Prescribing Physician: Dr Jonathon Bellows    Was patient administered Xolair on appointed administration date? [x]  yes []  no    Reason for not administering Xolair :[]  Illness []  No show []  Other:    Was there any reaction to mediation?[]  yes []  no    If Yes: Reactions:      1. In the past 4 weeks, how much of the time did your asthma keep you from getting as much done as usual at work, school or at home?    []  1 []  2 []  3 []  4 [x]  5   Score:___5___    2. During the past 4 weeks, how often have you had shortness of breath?    []  1 []  2 []  3 []  4 [x]  5   Score:___5___    3. During the past four weeks, how often did your asthma symptoms (wheezing, coughing, shortness of breath, chest tightness, or pain) wake you up at night or earlier than usual in the morning?    []  1 []  2 []  3 []  4 [x]  5   Score:__5____    4. During the past four weeks, how often have you used your rescue inhaler or nebulizer medication?    []  1 []  2 []  3 []  4 [x]  5   Score:__5____    5. How would you rate your asthma control during the past 4 weeks?    []  1 []  2 [x]  3 []  4 []  5   Score:__3____        Total Score:______3__________        To score the Asthma control test: Each response to the 5 ACT questions has a point value from 1-5 as shown on the form. To score the ACT, add up the point value for each response to the 5 questions.

## 2012-11-25 NOTE — Plan of Care (Signed)
No respiratory complications.  Discharged to home in stable condition

## 2012-12-09 MED ADMIN — omalizumab (XOLAIR) injection 375 mg: SUBCUTANEOUS | @ 15:00:00 | NDC 50242004062

## 2012-12-09 MED FILL — XOLAIR 150 MG SC SOLR: 150 mg | SUBCUTANEOUS | Qty: 450

## 2012-12-09 NOTE — Plan of Care (Signed)
[facility] Xolair Allergy Control Test    Prescribing Physician:  Dr Jonathon Bellows    Was patient administered Xolair on appointed administration date? [x]  yes []  no    Reason for not administering Xolair :[]  Illness []  No show []  Other:    Was there any reaction to mediation?[]  yes []  no    If Yes: Reactions:      1. In the past 4 weeks, how much of the time did your asthma keep you from getting as much done as usual at work, school or at home?    []  1 []  2 []  3 []  4 [x]  5   Score:___5___    2. During the past 4 weeks, how often have you had shortness of breath?    []  1 []  2 []  3 []  4 [x]  5   Score:___5___    3. During the past four weeks, how often did your asthma symptoms (wheezing, coughing, shortness of breath, chest tightness, or pain) wake you up at night or earlier than usual in the morning?    []  1 []  2 []  3 []  4 [x]  5   Score:___5___    4. During the past four weeks, how often have you used your rescue inhaler or nebulizer medication?    []  1 []  2 []  3 []  4 [x]  5   Score:____5__    5. How would you rate your asthma control during the past 4 weeks?    []  1 []  2 []  3 [x]  4 []  5   Score:___4___        Total Score:______24__________        To score the Asthma control test: Each response to the 5 ACT questions has a point value from 1-5 as shown on the form. To score the ACT, add up the point value for each response to the 5 questions.

## 2012-12-23 MED ADMIN — omalizumab (XOLAIR) injection 375 mg: SUBCUTANEOUS | @ 15:00:00 | NDC 50242004062

## 2012-12-23 MED FILL — XOLAIR 150 MG SC SOLR: 150 mg | SUBCUTANEOUS | Qty: 450

## 2012-12-23 NOTE — Progress Notes (Signed)
[facility] Xolair Allergy Control Test    Prescribing Physician:    Was patient administered Xolair on appointed administration date? [x]  yes []  no    Reason for not administering Xolair :[]  Illness []  No show []  Other:    Was there any reaction to mediation?[]  yes [x]  no    If Yes: Reactions:      1. In the past 4 weeks, how much of the time did your asthma keep you from getting as much done as usual at work, school or at home?    []  1 []  2 []  3 []  4 [x]  5   Score:__5____    2. During the past 4 weeks, how often have you had shortness of breath?    []  1 []  2 []  3 []  4 [x]  5   Score:__5____    3. During the past four weeks, how often did your asthma symptoms (wheezing, coughing, shortness of breath, chest tightness, or pain) wake you up at night or earlier than usual in the morning?    []  1 []  2 []  3 []  4 [x]  5   Score:__5____    4. During the past four weeks, how often have you used your rescue inhaler or nebulizer medication?    []  1 []  2 []  3 []  4 [x]  5   Score:__5___    5. How would you rate your asthma control during the past 4 weeks?    []  1 []  2 []  3 [x]  4 []  5   Score:__4____        Total Score:__24______________        To score the Asthma control test: Each response to the 5 ACT questions has a point value from 1-5 as shown on the form. To score the ACT, add up the point value for each response to the 5 questions.

## 2012-12-23 NOTE — Progress Notes (Signed)
Tolerated Xolair injection well. No complaints offered

## 2013-01-06 MED FILL — XOLAIR 150 MG SC SOLR: 150 MG | SUBCUTANEOUS | Qty: 375

## 2013-01-06 NOTE — Progress Notes (Signed)
Xolair given 2 injections to right arm,1 injection to left arm, 125 mg each.

## 2013-01-06 NOTE — Discharge Instructions (Signed)
[facility] Xolair Allergy Control Test    Prescribing Physician:    Was patient administered Xolair on appointed administration date? [x]  yes []  no    Reason for not administering Xolair :[]  Illness []  No show []  Other:    Was there any reaction to mediation?[]  yes [x]  no    If Yes: Reactions:      1. In the past 4 weeks, how much of the time did your asthma keep you from getting as much done as usual at work, school or at home?    []  1 []  2 []  3 []  4 [x]  5   Score:__5____    2. During the past 4 weeks, how often have you had shortness of breath?    []  1 []  2 []  3 []  4 [x]  5   Score:_5_____    3. During the past four weeks, how often did your asthma symptoms (wheezing, coughing, shortness of breath, chest tightness, or pain) wake you up at night or earlier than usual in the morning?    []  1 []  2 []  3 [x]  4 []  5   Score:_4_____    4. During the past four weeks, how often have you used your rescue inhaler or nebulizer medication?    []  1 []  2 []  3 []  4 [x]  5   Score:_5_____    5. How would you rate your asthma control during the past 4 weeks?    []  1 []  2 []  3 [x]  4 []  5   Score:__4____        Total Score:____23____________        To score the Asthma control test: Each response to the 5 ACT questions has a point value from 1-5 as shown on the form. To score the ACT, add up the point value for each response to the 5 questions.

## 2013-01-20 MED ADMIN — omalizumab (XOLAIR) injection 375 mg: SUBCUTANEOUS | @ 15:00:00 | NDC 50242004062

## 2013-01-20 MED FILL — XOLAIR 150 MG SC SOLR: 150 mg | SUBCUTANEOUS | Qty: 450

## 2013-01-20 NOTE — Other (Signed)
Tolerated injection well discharged to home in stable condition

## 2013-01-20 NOTE — Discharge Instructions (Signed)
[facility] Xolair Allergy Control Test    Prescribing Physician:barriero    Was patient administered Xolair on appointed administration date? [x]  yes []  no    Reason for not administering Xolair :[]  Illness []  No show []  Other:    Was there any reaction to mediation?[]  yes []  no    If Yes: Reactions:      1. In the past 4 weeks, how much of the time did your asthma keep you from getting as much done as usual at work, school or at home?    []  1 []  2 []  3 [x]  4 []  5   Score:___4___    2. During the past 4 weeks, how often have you had shortness of breath?    []  1 []  2 []  3 []  4 [x]  5   Score:__5____    3. During the past four weeks, how often did your asthma symptoms (wheezing, coughing, shortness of breath, chest tightness, or pain) wake you up at night or earlier than usual in the morning?    []  1 [x]  2 []  3 []  4 []  5   Score:___2___    4. During the past four weeks, how often have you used your rescue inhaler or nebulizer medication?    []  1 []  2 []  3 []  4 [x]  5   Score:____5_    5. How would you rate your asthma control during the past 4 weeks?    []  1 []  2 [x]  3 []  4 []  5   Score:__3__        Total Score:_____________19___        To score the Asthma control test: Each response to the 5 ACT questions has a point value from 1-5 as shown on the form. To score the ACT, add up the point value for each response to the 5 questions.

## 2013-01-25 ENCOUNTER — Encounter

## 2013-02-03 MED ADMIN — omalizumab (XOLAIR) injection 375 mg: SUBCUTANEOUS | @ 14:00:00 | NDC 50242004062

## 2013-02-03 MED FILL — XOLAIR 150 MG SC SOLR: 150 mg | SUBCUTANEOUS | Qty: 450

## 2013-02-03 NOTE — Progress Notes (Signed)
Discharged to home in stable condition

## 2013-02-03 NOTE — Discharge Instructions (Signed)
[facility]U Xolair Allergy Control Test    Prescribing Physician:    Was patient administered Xolair on appointed administration date? []  yes []  no    Reason for not administering Xolair :[]  Illness []  No show []  Other:    Was there any reaction to mediation?[]  yes []  no    If Yes: Reactions:      1. In the past 4 weeks, how much of the time did your asthma keep you from getting as much done as usual at work, school or at home?    []  1 []  2 []  3 []  4 [x]  5   Score:_5____    2. During the past 4 weeks, how often have you had shortness of breath?    []  1 []  2 []  3 []  4 [x]  5   Score:___5_    3. During the past four weeks, how often did your asthma symptoms (wheezing, coughing, shortness of breath, chest tightness, or pain) wake you up at night or earlier than usual in the morning?    []  1 []  2 [x]  3 []  4 []  5   Score:___3___    4. During the past four weeks, how often have you used your rescue inhaler or nebulizer medication?    []  1 []  2 []  3 []  4 [x]  5   Score:___5___    5. How would you rate your asthma control during the past 4 weeks?    []  1 []  2 [x]  3 []  4 []  5   Score:___3___        Total Score:_____________21___        To score the Asthma control test: Each response to the 5 ACT questions has a point value from 1-5 as shown on the form. To score the ACT, add up the point value for each response to the 5 questions.

## 2013-02-17 MED ADMIN — omalizumab (XOLAIR) injection 375 mg: SUBCUTANEOUS | @ 15:00:00 | NDC 50242004062

## 2013-02-17 MED FILL — XOLAIR 150 MG SC SOLR: 150 mg | SUBCUTANEOUS | Qty: 450

## 2013-02-17 NOTE — Discharge Instructions (Signed)
[facility] MDRU Xolair Allergy Control Test    Prescribing Physician:    Was patient administered Xolair on appointed administration date? [x]  yes []  no    Reason for not administering Xolair :[]  Illness []  No show []  Other:    Was there any reaction to mediation?[]  yes [x]  no    If Yes: Reactions:      1. In the past 4 weeks, how much of the time did your asthma keep you from getting as much done as usual at work, school or at home?    []  1 []  2 [x]  3 []  4 []  5   Score:__3____    2. During the past 4 weeks, how often have you had shortness of breath?    []  1 []  2 []  3 []  4 [x]  5   Score:___5___    3. During the past four weeks, how often did your asthma symptoms (wheezing, coughing, shortness of breath, chest tightness, or pain) wake you up at night or earlier than usual in the morning?    []  1 [x]  2 []  3 []  4 []  5   Score:__2____    4. During the past four weeks, how often have you used your rescue inhaler or nebulizer medication?    []  1 []  2 []  3 []  4 [x]  5   Score:_5_____    5. How would you rate your asthma control during the past 4 weeks?    []  1 []  2 [x]  3 []  4 []  5   Score:__3____        Total Score:___18_____________        To score the Asthma control test: Each response to the 5 ACT questions has a point value from 1-5 as shown on the form. To score the ACT, add up the point value for each response to the 5 questions.

## 2013-03-03 MED ADMIN — omalizumab (XOLAIR) injection 375 mg: SUBCUTANEOUS | @ 15:00:00 | NDC 50242004062

## 2013-03-03 MED FILL — XOLAIR 150 MG SC SOLR: 150 mg | SUBCUTANEOUS | Qty: 450

## 2013-03-03 NOTE — Progress Notes (Signed)
[facility] MDRU Xolair Allergy Control Test    Prescribing Physician:    Was patient administered Xolair on appointed administration date? [x]  yes []  no    Reason for not administering Xolair :[]  Illness []  No show []  Other:    Was there any reaction to mediation?[]  yes [x]  no    If Yes: Reactions:      1. In the past 4 weeks, how much of the time did your asthma keep you from getting as much done as usual at work, school or at home?    []  1 []  2 []  3 []  4 [x]  5   Score:_5_____    2. During the past 4 weeks, how often have you had shortness of breath?    []  1 []  2 []  3 []  4 [x]  5   Score:_5_____    3. During the past four weeks, how often did your asthma symptoms (wheezing, coughing, shortness of breath, chest tightness, or pain) wake you up at night or earlier than usual in the morning?    []  1 []  2 []  3 [x]  4 []  5   Score:_4_____    4. During the past four weeks, how often have you used your rescue inhaler or nebulizer medication?    []  1 []  2 []  3 []  4 [x]  5   Score:_5_____    5. How would you rate your asthma control during the past 4 weeks?    []  1 []  2 []  3 [x]  4 []  5   Score:_4_____        Total Score:____23____________        To score the Asthma control test: Each response to the 5 ACT questions has a point value from 1-5 as shown on the form. To score the ACT, add up the point value for each response to the 5 questions.

## 2013-03-15 MED FILL — XOLAIR 150 MG SC SOLR: 150 MG | SUBCUTANEOUS | Qty: 450

## 2013-03-17 MED ADMIN — omalizumab (XOLAIR) injection 375 mg: SUBCUTANEOUS | @ 15:00:00 | NDC 50242004062

## 2013-03-17 MED FILL — XOLAIR 150 MG SC SOLR: 150 mg | SUBCUTANEOUS | Qty: 450

## 2013-03-17 NOTE — Discharge Instructions (Addendum)
[facility]U Xolair Allergy Control Test    Prescribing Physician:    Was patient administered Xolair on appointed administration date? [x]  yes []  no    Reason for not administering Xolair :[]  Illness []  No show []  Other:    Was there any reaction to mediation?[]  yes [x]  no    If Yes: Reactions:      1. In the past 4 weeks, how much of the time did your asthma keep you from getting as much done as usual at work, school or at home?    []  1 []  2 []  3 [x]  4 []  5   Score:_4_____    2. During the past 4 weeks, how often have you had shortness of breath?    []  1 []  2 []  3 []  4 [x]  5   Score:__5____    3. During the past four weeks, how often did your asthma symptoms (wheezing, coughing, shortness of breath, chest tightness, or pain) wake you up at night or earlier than usual in the morning?    []  1 []  2 []  3 []  4 [x]  5   Score:__5____    4. During the past four weeks, how often have you used your rescue inhaler or nebulizer medication?    []  1 []  2 []  3 []  4 [x]  5   Score:_5_____    5. How would you rate your asthma control during the past 4 weeks?    []  1 []  2 []  3 [x]  4 []  5   Score:_4_____        Total Score:_____23___________        To score the Asthma control test: Each response to the 5 ACT questions has a point value from 1-5 as shown on the form. To score the ACT, add up the point value for each response to the 5 questions.

## 2013-03-31 MED ADMIN — omalizumab (XOLAIR) injection 375 mg: SUBCUTANEOUS | @ 15:00:00 | NDC 50242004062

## 2013-03-31 MED FILL — XOLAIR 150 MG SC SOLR: 150 mg | SUBCUTANEOUS | Qty: 450

## 2013-03-31 NOTE — Progress Notes (Signed)
Tolerated injection well discharged to home in stable condition

## 2013-03-31 NOTE — Progress Notes (Signed)
[facility]U Xolair Allergy Control Test    Prescribing Physician:Barreiro  Was patient administered Xolair on appointed administration date? [x]  yes []  no    Reason for not administering Xolair :[]  Illness []  No show []  Other:    Was there any reaction to mediation?[]  yes []  no    If Yes: Reactions:      1. In the past 4 weeks, how much of the time did your asthma keep you from getting as much done as usual at work, school or at home?    []  1 []  2 []  3 [x]  4 []  5   Score:4_____    2. During the past 4 weeks, how often have you had shortness of breath?    []  1 []  2 []  3 [x]  4 []  5   Score:_4_____    3. During the past four weeks, how often did your asthma symptoms (wheezing, coughing, shortness of breath, chest tightness, or pain) wake you up at night or earlier than usual in the morning?    []  1 []  2 []  3 []  4 [x]  5   Score:_5_____    4. During the past four weeks, how often have you used your rescue inhaler or nebulizer medication?    []  1 []  2 []  3 []  4 [x]  5   Score:__5____    5. How would you rate your asthma control during the past 4 weeks?    []  1 []  2 []  3 [x]  4 []  5   Score:_4_____        Total Score:__22______________        To score the Asthma control test: Each response to the 5 ACT questions has a point value from 1-5 as shown on the form. To score the ACT, add up the point value for each response to the 5 questions.

## 2013-04-14 MED ADMIN — omalizumab (XOLAIR) injection 375 mg: SUBCUTANEOUS | @ 15:00:00 | NDC 50242004062

## 2013-04-14 MED FILL — XOLAIR 150 MG SC SOLR: 150 mg | SUBCUTANEOUS | Qty: 450

## 2013-04-14 NOTE — Discharge Instructions (Signed)
[facility]U Xolair Allergy Control Test    Prescribing Physician:    Was patient administered Xolair on appointed administration date? [x]  yes []  no    Reason for not administering Xolair :[]  Illness []  No show []  Other:    Was there any reaction to mediation?[]  yes [x]  no    If Yes: Reactions:      1. In the past 4 weeks, how much of the time did your asthma keep you from getting as much done as usual at work, school or at home?    []  1 []  2 []  3 [x]  4 []  5   Score:_4_____    2. During the past 4 weeks, how often have you had shortness of breath?    []  1 []  2 []  3 []  4 [x]  5   Score:__5____    3. During the past four weeks, how often did your asthma symptoms (wheezing, coughing, shortness of breath, chest tightness, or pain) wake you up at night or earlier than usual in the morning?    []  1 []  2 []  3 [x]  4 []  5   Score:__4____    4. During the past four weeks, how often have you used your rescue inhaler or nebulizer medication?    []  1 []  2 []  3 []  4 [x]  5   Score:_5_____    5. How would you rate your asthma control during the past 4 weeks?    []  1 []  2 []  3 [x]  4 []  5   Score:_4_____        Total Score:___26_____________        To score the Asthma control test: Each response to the 5 ACT questions has a point value from 1-5 as shown on the form. To score the ACT, add up the point value for each response to the 5 questions.

## 2013-04-28 MED ADMIN — omalizumab (XOLAIR) injection 375 mg: SUBCUTANEOUS | @ 15:00:00 | NDC 50242004062

## 2013-04-28 MED FILL — XOLAIR 150 MG SC SOLR: 150 mg | SUBCUTANEOUS | Qty: 450

## 2013-04-28 NOTE — Discharge Instructions (Signed)
[facility] MDRU Xolair Allergy Control Test    Prescribing Physician:    Was patient administered Xolair on appointed administration date? [x]  yes []  no    Reason for not administering Xolair :[]  Illness []  No show []  Other:    Was there any reaction to mediation?[]  yes [x]  no    If Yes: Reactions:      1. In the past 4 weeks, how much of the time did your asthma keep you from getting as much done as usual at work, school or at home?    []  1 []  2 []  3 [x]  4 []  5   Score:__4____    2. During the past 4 weeks, how often have you had shortness of breath?    []  1 []  2 []  3 []  4 [x]  5   Score:_5_____    3. During the past four weeks, how often did your asthma symptoms (wheezing, coughing, shortness of breath, chest tightness, or pain) wake you up at night or earlier than usual in the morning?    []  1 []  2 []  3 [x]  4 []  5   Score:__4____    4. During the past four weeks, how often have you used your rescue inhaler or nebulizer medication?    []  1 []  2 []  3 []  4 [x]  5   Score:_5_____    5. How would you rate your asthma control during the past 4 weeks?    []  1 []  2 []  3 [x]  4 []  5   Score:__4____        Total Score:___22_____________        To score the Asthma control test: Each response to the 5 ACT questions has a point value from 1-5 as shown on the form. To score the ACT, add up the point value for each response to the 5 questions.

## 2013-04-28 NOTE — Progress Notes (Signed)
Patient tolerated injection without complaint, vital signs remain stable.  Patient discharged ambulatory.

## 2013-05-12 MED ADMIN — omalizumab (XOLAIR) injection 375 mg: SUBCUTANEOUS | @ 15:00:00 | NDC 50242004062

## 2013-05-12 MED FILL — XOLAIR 150 MG SC SOLR: 150 mg | SUBCUTANEOUS | Qty: 450

## 2013-05-12 NOTE — Discharge Instructions (Signed)
[facility] MDRU Xolair Allergy Control Test    Prescribing Physician:    Was patient administered Xolair on appointed administration date? [x]  yes []  no    Reason for not administering Xolair :[]  Illness []  No show []  Other:    Was there any reaction to mediation?[]  yes [x]  no    If Yes: Reactions:      1. In the past 4 weeks, how much of the time did your asthma keep you from getting as much done as usual at work, school or at home?    []  1 []  2 [x]  3 []  4 []  5   Score:__3____    2. During the past 4 weeks, how often have you had shortness of breath?    []  1 []  2 []  3 []  4 [x]  5   Score:__5____    3. During the past four weeks, how often did your asthma symptoms (wheezing, coughing, shortness of breath, chest tightness, or pain) wake you up at night or earlier than usual in the morning?    []  1 []  2 []  3 [x]  4 []  5   Score:__4____    4. During the past four weeks, how often have you used your rescue inhaler or nebulizer medication?    []  1 []  2 []  3 []  4 [x]  5   Score:__5____    5. How would you rate your asthma control during the past 4 weeks?    []  1 []  2 [x]  3 []  4 []  5   Score:__3____        Total Score:___20_____________        To score the Asthma control test: Each response to the 5 ACT questions has a point value from 1-5 as shown on the form. To score the ACT, add up the point value for each response to the 5 questions.

## 2013-05-26 MED ADMIN — omalizumab (XOLAIR) injection 375 mg: SUBCUTANEOUS | @ 15:00:00 | NDC 50242004062

## 2013-05-26 MED FILL — XOLAIR 150 MG SC SOLR: 150 mg | SUBCUTANEOUS | Qty: 450

## 2013-05-26 NOTE — Progress Notes (Signed)
Patient here for Xolair injections. Tolerated well. Patient states that after January 22nd dose Doctor is taking patient off Xolair for 6 months for a trial period. Patient is leaving for vacation hat following Sunday and states that his dose will need to be changed for his last dose. Crist Fat, RN

## 2013-05-26 NOTE — Progress Notes (Signed)
[facility]U Xolair Allergy Control Test    Prescribing Physician:  Dr Buelah Manis    Was patient administered Xolair on appointed administration date? [x]  yes []  no    Reason for not administering Xolair :[]  Illness []  No show []  Other:    Was there any reaction to mediation?[]  yes []  no    If Yes: Reactions:      1. In the past 4 weeks, how much of the time did your asthma keep you from getting as much done as usual at work, school or at home?    []  1 []  2 []  3 [x]  4 []  5   Score:____4__    2. During the past 4 weeks, how often have you had shortness of breath?    []  1 []  2 []  3 []  4 [x]  5   Score:__5____    3. During the past four weeks, how often did your asthma symptoms (wheezing, coughing, shortness of breath, chest tightness, or pain) wake you up at night or earlier than usual in the morning?    []  1 []  2 []  3 [x]  4 []  5   Score:___4___    4. During the past four weeks, how often have you used your rescue inhaler or nebulizer medication?    []  1 []  2 []  3 []  4 [x]  5   Score:___5___    5. How would you rate your asthma control during the past 4 weeks?    []  1 []  2 []  3 [x]  4 []  5   Score:__4____      Total Score:_______22_________    Crist Fat RN    To score the Asthma control test: Each response to the 5 ACT questions has a point value from 1-5 as shown on the form. To score the ACT, add up the point value for each response to the 5 questions.

## 2013-05-26 NOTE — Progress Notes (Signed)
Patient discharged in stable condition. Aveleen Nevers, RN

## 2013-06-09 MED ADMIN — omalizumab (XOLAIR) injection 375 mg: SUBCUTANEOUS | @ 15:00:00 | NDC 50242004062

## 2013-06-09 MED FILL — XOLAIR 150 MG SC SOLR: 150 mg | SUBCUTANEOUS | Qty: 450

## 2013-06-09 NOTE — Discharge Instructions (Signed)
[facility] MDRU Xolair Allergy Control Test    Prescribing Physician:    Was patient administered Xolair on appointed administration date? [x]  yes []  no    Reason for not administering Xolair :[]  Illness []  No show []  Other:    Was there any reaction to mediation?[]  yes [x]  no    If Yes: Reactions:      1. In the past 4 weeks, how much of the time did your asthma keep you from getting as much done as usual at work, school or at home?    []  1 []  2 []  3 [x]  4 []  5   Score:___4___    2. During the past 4 weeks, how often have you had shortness of breath?    []  1 []  2 []  3 []  4 [x]  5   Score:__5____    3. During the past four weeks, how often did your asthma symptoms (wheezing, coughing, shortness of breath, chest tightness, or pain) wake you up at night or earlier than usual in the morning?    []  1 []  2 []  3 [x]  4 []  5   Score:__4____    4. During the past four weeks, how often have you used your rescue inhaler or nebulizer medication?    []  1 []  2 []  3 [x]  4 []  5   Score:__4____    5. How would you rate your asthma control during the past 4 weeks?    []  1 []  2 []  3 [x]  4 []  5   Score:___4___        Total Score:______21__________        To score the Asthma control test: Each response to the 5 ACT questions has a point value from 1-5 as shown on the form. To score the ACT, add up the point value for each response to the 5 questions.

## 2013-06-23 MED ADMIN — omalizumab (XOLAIR) injection 375 mg: 375 mg | SUBCUTANEOUS | @ 16:00:00 | NDC 50242004062

## 2013-06-23 MED FILL — XOLAIR 150 MG SC SOLR: 150 mg | SUBCUTANEOUS | Qty: 450

## 2013-06-23 NOTE — Discharge Instructions (Signed)
[facility] Xolair All[facility] Control [facility]    Prescribing Physician:    Was patient administered Xolair on appointed administration date? [x]  yes []  no    Reason for not administering Xolair :[]  Illness []  No show []  Other:    Was there any reaction to mediation?[]  yes [x]  no    If Yes: Reactions:      1. In the past 4 weeks, how much of the time did your asthma keep you from getting as much done as usual at work, school or at home?    []  1 []  2 []  3 [x]  4 []  5   Score:__4____    2. During the past 4 weeks, how often have you had shortness of breath?    []  1 []  2 []  3 []  4 [x]  5   Score:__5____    3. During the past four weeks, how often did your asthma symptoms (wheezing, coughing, shortness of breath, chest tightness, or pain) wake you up at night or earlier than usual in the morning?    []  1 []  2 [x]  3 []  4 []  5   Score:__3____    4. During the past four weeks, how often have you used your rescue inhaler or nebulizer medication?    []  1 []  2 []  3 []  4 [x]  5   Score:__5____    5. How would you rate your asthma control during the past 4 weeks?    []  1 []  2 []  3 [x]  4 []  5   Score:__4____        Total Score:_____21___________        To score the Asthma control test: Each response to the 5 ACT questions has a point value from 1-5 as shown on the form. To score the ACT, add up the point value for each response to the 5 questions.

## 2013-07-05 ENCOUNTER — Encounter

## 2013-07-05 MED ORDER — OMALIZUMAB 150 MG SC SOLR
150 MG | SUBCUTANEOUS | Status: DC
Start: 2013-07-05 — End: 2014-01-01

## 2013-07-05 NOTE — Telephone Encounter (Signed)
Spoke with nurse in [facility] who stated that the patient told them that the physician was trying to take patient off the xolair medication and he was going to only get a half a dose on his next infusion.  [facility] and Dr. Buelah ManisBarreiro was given the fax.  Dr. Buelah ManisBarreiro ordered the xolair dose to be cut in half.  Pharmacy called to verify the medications dosage forms and 150mg s was ordered.  A script was sent to [facility] with the new dosage.

## 2013-07-06 MED ADMIN — omalizumab (XOLAIR) injection 150 mg: 150 mg | SUBCUTANEOUS | @ 16:00:00 | NDC 50242004062

## 2013-07-06 MED FILL — XOLAIR 150 MG SC SOLR: 150 mg | SUBCUTANEOUS | Qty: 150

## 2013-07-06 MED FILL — XOLAIR 150 MG SC SOLR: 150 MG | SUBCUTANEOUS | Qty: 450

## 2013-07-06 NOTE — Progress Notes (Signed)
[facility]    Prescribing Physician:barreiro  Was patient administered Xolair on appointed administration date? [x]  yes []  no    Reason for not administering Xolair :[]  Illness []  No show []  Other:    Was there any reaction to mediation?[]  yes []  no    If Yes: Reactions:      1. In the past 4 weeks, how much of the time did your asthma keep you from getting as much done as usual at work, school or at home?    []  1 []  2 []  3 []  4 [x]  5   Score:5______    2. During the past 4 weeks, how often have you had shortness of breath?    []  1 []  2 []  3 []  4 [x]  5   Score:_5_____    3. During the past four weeks, how often did your asthma symptoms (wheezing, coughing, shortness of breath, chest tightness, or pain) wake you up at night or earlier than usual in the morning?    []  1 []  2 []  3 []  4 [x]  5   Score:__5____    4. During the past four weeks, how often have you used your rescue inhaler or nebulizer medication?    []  1 []  2 []  3 []  4 [x]  5   Score5_____    5. How would you rate your asthma control during the past 4 weeks?    []  1 []  2 []  3 [x]  4 []  5   Score:_4    Total Score:_24_____________        To score the Asthma control test: Each response to the 5 ACT questions has a point value from 1-5 as shown on the form. To score the ACT, add up the point value for each response to the 5 questions.

## 2013-08-22 ENCOUNTER — Encounter

## 2013-08-22 LAB — SPIROMETRY WITHOUT BRONCHODILATOR
FEF 25-75% PRE PRED: 2.25
FEF 25-75%-Pre: 2.4
FEV1 %Pred-Pre: 86
FEV1/FVC Pred: 73
FEV1/FVC: 81 %
FEV1: 2.61 L
FEV3 PRE: 3.19
FVC %Pred-Pre: 78
FVC: 3.25 L
PEF %Pred-Pre: 114
PEF-Pre: 8.99 L/sec

## 2013-08-22 NOTE — Progress Notes (Signed)
[facility]    Department of Internal Medicine  Division of Pulmonary, Critical Care & Sleep Medicine    Dear Julian Guard, MD    We had the pleasure of seeing  Julian Castaneda in the [facility] regarding his chronic sinusitis, reactive airway disease & obstructive sleep apnea.     HISTORY OF PRESENT ILLNESS:    Julian Castaneda is a 72 y.o. year old male who has a long standing history of allergies, elevated Ig E level around 600 requiring the administration of Xolair therapy. 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 Last level in 06/2010 was negative. The her recall He under when endoscopic surgery on 06/30/07 antrostomy bilateral with ethmoidectomies and left single sphenoidotomies and septoplasty. His treatment is working well, which included nasal rinses, Patanase, Astelin, and Xolair. I will  With his sleep apnea he is very compliant using the BiPAP machine nightly for at least 6 hour. His current pressure are 18/ 9 cm H20.     Since the last visit Julian Castaneda is doing well. He has been compliant with his CPAP and uses it nightly. He is off Xolair and uses pseudoephedrine on occasion. His ACT is 22/25. He has no CP, SOB or leg edema. No neurologic symptoms. He has long standing GERD requiring anti-reflux surgery years ago. His last EGD which was negative by Dr. Lavone Castaneda he has had no repeat complaints. His antireflux surgery anastomosis was stable and clear. He was also found to have a elevated PSA and a recent biopsy done of the prostate which was benign.  Since the last visit he lost his taste and smelling and stop the inhalers and the symptoms resolved. His PFTs are improved to stable at > 80 %.  He will return in a 1 year. (his request).        ALLERGIES:    Allergies   Allergen Reactions   ??? Nexium [Esomeprazole Magnesium Trihydrate] Swelling   ??? Nsaids Swelling   ??? Reglan [Metoclopramide Hcl] Other (See Comments)     States has bloating with this       PAST MEDICAL HISTORY:       Diagnosis Date   ??? Allergy    ??? Sinusitis    ??? GERD (gastroesophageal reflux disease)    ??? Hyperlipidemia    ??? Sleep apnea      CHM 18/9   ??? Diabetes mellitus (HCC)    ??? Asthma    ??? CSS (Churg-Strauss syndrome) (HCC)    ??? Elevated PSA      negative biospy   ??? Kidney stone         MEDICATIONS:   Current Outpatient Prescriptions   Medication Sig Dispense Refill   ??? Cholecalciferol (VITAMIN D) 2000 UNITS CAPS capsule Take 2,000 Units by mouth.       ??? VITAMIN E PO Take  by mouth.       ??? EPINEPHrine (EPIPEN 2-PAK) 0.3 MG/0.3ML DEVI injection Inject 0.3 mLs into the muscle once as needed for 1 dose.  1 Device  3   ??? hydrochlorothiazide (HYDRODIURIL) 25 MG tablet Take 25 mg by mouth daily.         ??? Sodium Chloride-Sodium Bicarb (AYR SALINE NASAL RINSE NA) 1 applicator by Nasal route daily.         ???  enalapril (VASOTEC) 5 MG tablet Take 5 mg by mouth daily.         ??? atorvastatin (LIPITOR) 80 MG tablet Take 80 mg by mouth daily.       ??? azelastine (ASTELIN) 137 MCG/SPRAY nasal spray 2 sprays by Nasal route daily. Use in each nostril as directed       ??? fish oil-omega-3 fatty acids 1000 MG capsule Take 2 g by mouth daily.       ??? olopatadine (PATANASE) 0.6 % SOLN nassl soln 2 sprays by Nasal route 2 times daily.  3 Bottle  4   ??? omalizumab (XOLAIR) 150 MG injection Inject 150 mg into the skin every 14 days.  1 vial  3   ??? XOPENEX HFA 45 MCG/ACT inhaler Inhale 1 puff into the lungs every 8 hours as needed for Wheezing.  1 Inhaler  12     No current facility-administered medications for this visit.       SOCIAL AND OCCUPATIONAL HEALTH:  He is married with four children. The patient is a never smoked  There is not history of TB or TB exposure.  No Etoh Abuse.  There is not asbestos  or silica dust exposure.  The patient reports no coal, foundry, quarry or [employer] exposure.  Travel history reveals no trips.  Denies history of recreational or IV drug use. Denies hot tub exposure. The patient has no pets. Hobbies include watching soap opera.    History   Substance Use Topics   ??? Smoking status: Never Smoker    ??? Smokeless tobacco: Never Used   ??? Alcohol Use: No       SURGICAL HISTORY:   Past Surgical History   Procedure Laterality Date   ??? Gastric fundoplication     ??? Septoplasty     ??? Hernia repair     ??? Nasal polyp surgery     ??? Prostate biopsy  2011     negative pathology   ??? Upper gastrointestinal endoscopy  12/2009     with biopsy, pathology negative   ??? Tumor removal  11/2010     fatty tumor removed Dr Julian Castaneda       FAMILY HISTORY:   Family History   Problem Relation Age of Onset   ??? High Blood Pressure Father    ??? High Cholesterol Father    ??? Asthma Sister    ??? Diabetes Sister        REVIEW OF SYSTEMS:   Constitutional: General health good . No weight changes.  No fevers, fatigue or weakness.   Head: Patient denies any history of trauma, convulsive disorder or syncope.    Skin:  Patient denies history of changes in pigmentation, eruptions or pruritus.  No easy bruising or bleeding.  EENT: Patient denies any history of color blindness, photophobia, diplopia, inflammation, cataracts or glaucoma.   Patient denies history of deafness, tinnitus, pain, discharge or recurrent infections.    Patient has a long history of rhinitis, chronic nasal discharge, drainage, & nasal polyps; all requiring surgery. Patient denies history of soreness of mouth or tongue.   Patient denies history of hoarseness, voice changes, sore throats or tonsillitis.    Lymphatic:  Patient denies history of enlargement, inflammation, pain or suppuration.    Cardiovascular:  Patient denies history of palpations, heart murmur, irregular rhythm, chest pain, exertional dyspnea, cyanosis, ascites, rheumatic fever, cold extremities  or edema.  Respiration:  Patient denies wheezing, dyspnea, nocturnal dyspnea, cough, hemoptysis, pleurisy, TB  or asthma.   Known OSA on B[place] 19/ 8 cm H20. 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   Gastrointestinal: Patient denies changes in appetite. No dysphagia, odynophagia or abdominal pain.  No hematochezia, melena, bowel habit changes or hemorrhoids.    No jaundice. GERD Gastic surgery in past with poor results.   Genitourinary:   Patient denies dysuria, frequency, urgency or incontinence.  Complains of difficulty in starting or stopping urinary stream.    Reports history of stones which is followed by [facility].  He has benign prostatic hypertrophy but no prostate cancer. Recent Biopsy.   Musculoskeletal: The patient denies history of arthritis or joint pains.  No loss of strength or dislocation.  Breasts:  No history of masses, lumps, pain, nipple changes or painful nipples.  Neurological: Patient denies vertigo, syncope.  No twitching or memory.   Psychological: Patient denies moodiness, depression or anxiety.  No obsessions or hallucinations.    Endocrine:  No history of goiter, exophthalmos or dryness of skin.  No polyuria or polyphagia.  No diabetes.   Hematopoietic:  No history of bleeding disorders or easy bruising.  Rheumatic:  No polyarthritis or inflammatory joint disease. History of positive P-ANCA    PHYSICAL EXAMINATION:  Filed Vitals:    08/22/13 1517   BP: 140/78   Pulse: 68   Resp: 10   Height: 5\' 9"  (1.753 m)   Weight: 214 lb (97.07 kg)   SpO2: 95%     Constitutional: A well developed, well nourished 73 y.o.  male  who is alert, oriented, cooperative and in no apparent distress.  Head was normocephalic and atraumatic.  Male patten baldness.   EENT: Eyes were equal and reactive to light and accommodation.  Extraocular muscles intact.  No conjunctival injections.  External canals are patent.   No discharge was appreciated.  Septum was mildly deviated, mucosa was without erythema, exudates  or cobblestoning.  No thrush was noted.  Swollen turbinates.   Neck: Supple without thyromegaly. No elevated JVP. Trachea was midline. No carotid bruits.    Respiratory: Symmetrical without dullness to percussion.  Breath sounds bilaterally were clear to auscultation.   No wheezes, rhonchi or rales. No intercostal retraction or use of accessory muscles. No egophony noted.   Cardiovascular: Regular without murmur, clicks, gallops or rubs.  No left or right ventricular heave.    Pulses:  Equal bilaterally.    Abdomen: Slightly rounded, soft without organomegaly. No rebound, rigidity or guarding.  Surgical (+) scars  Lymphatic: No lymphadenopathy.  Musculoskeletal:Normal curvature of the spine.  No gross muscle weakness.    Extremities:  No lower extremity edema.  No ulcerations, tenderness, varicosities or erythema.  Muscle size, tone and strength are normal.    Sensory function appears intact.  Deep tendon reflexes are normal.   Skin:  Warm and dry.  Good color, turgor and pigmentation. No lesions. Varicoties noted. No rash.   Neurological/Psychiatric: The patient's general behavior, level of consciousness, thought content and emotional status is normal.  Cranial nerves II-XII are intact.      DATA:     Today Spirometry compared to previous demonstrates an FVC of 3.25  which is 78 % of predicted with an FEV1 of  2.61 liters which is 86 % of predicted.  FEV1/FVC ratio is 81%.  Maximum voluntary ventilation is 128 liters per minute or 99 % of predicted.  Flow volume loop shows no signs of intrathoracic or extrathoracic obstruction. Impression. Normal.  Asthma Control Test [ACT] is a 5 part questionnaire, which has a point value system that ranges from 1- 5. This test is a validated questionnaire to help determine asthma control.  A total score of 19 or below indicates that the patients asthma may not be well controlled. If used in conjunction with PFTs, the correlation of determining asthma control is even stronger.   Today on testing the ACT was 21 out of max of 25. Impression: Poor control.     Epworth Sleepiness Scale is use this Tool to Measure Sleep Deprivation. The Epworth Sleepiness Scale was developed by researchers in [place] and is widely used by sleep professionals around the world to measure sleep deprivation.  How likely are you to doze off or fall asleep in the following situations, in contrast to feeling just tired? This refers to your usual way of life in recent times.  Results: Total Epworth Score: 3  Interpretation: No hypersomnolence.    IMPRESSION:      Julian Castaneda a 73 y.o. 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 While we have never proven the systemic vasculitis or necrotizing vasculitis part and he has no renal, neuropathic or cardiovascular involvement; the finding of chronic sinusitis, eosinophilia, pANCA (+) and asthma aid in the diagnosis.  He has no neuropathy or neuropathic mononeuritis. His p-ANCA was 1:160 but fluctuates and on last check & this check it was normal.  However, the clinical utility of ANCA in CSS raises a controversial issue of the sensitivity and specificity of ANCA in systemic vasculitis. In CSS P-ANCA is 50%.  He should never receive leukotriene modifier, allopurinol, propylthiouracil and quinine with this history of CSS for it will cause active flairs?.     His chronic allergies, sinusitis with rhinitis and nasal  polyposis is stable after surgery. He also has a failed Nissen fundoplication with chronic gastroparesis and GERD stable based on last. Currently he is doing well off the Xolair, nasal steroids and antihistamines. He is using the sinus rinse and prn intranasal steroid.  As for his sleep apnea he is doing very well with his BiPAP.  With this in mind, we would like to proceed with the following;                PLAN:    We have stopped the Xolair for  his allergies and elevated IgE level about 6 months and he is without a decline. Sleep apnea symptoms are still moderate despite adjusting Bilevel therapy.  We refilled his medications and will update you once the testing is completed. He will follow up in 6 months to a year for re-evaluation.  Pneumoccial vaccines were given.  We hope this updates you on my evaluation and clinical thinking. Thank you for allowing [place] to participate in [name] care.     Sincerely,        Elmer Ramp, D.O., [employer], FACP  Associate Professor of Medicine  Director, [facility]

## 2013-08-22 NOTE — Patient Instructions (Signed)
pneumococcal 13-valent conjugate vaccine  Pronunciation: NOO moe KOK al 13-VAY lent KON joo gate VAX een  Brand: Prevnar 13  What is pneumococcal 13-valent conjugate vaccine?  Pneumococcal disease is a serious infection caused by a bacteria. Pneumococcal bacteria can infect the sinuses and inner ear. It can also infect the lungs, blood, and brain, and these conditions can be fatal.  Pneumococcal 13-valent vaccine is used to prevent infection caused by pneumococcal bacteria. This vaccine contains 13 different types of pneumococcal bacteria.  Pneumococcal 13-valent vaccine works by exposing you to a small amount of the bacteria or a protein from the bacteria, which causes the body to develop immunity to the disease. This vaccine will not treat an active infection that has already developed in the body.  Pneumococcal 13-valent vaccine is for use in children from 6 weeks to 5 years old, and in adults who are 50 and older.  Becoming infected with pneumococcal disease (such as pneumonia or meningitis) is much more dangerous to your health than receiving this vaccine. However, like any medicine, this vaccine can cause side effects but the risk of serious side effects is extremely low.  Like any vaccine, pneumococcal 13-valent vaccine may not provide protection from disease in every person.  What should I discuss with my healthcare provider before receiving this vaccine?  Keep track of any and all side effects your child has after receiving this vaccine. When the child receives a booster dose, you will need to tell the doctor if the previous shot caused any side effects.  You should not receive this vaccine if you ever had a severe allergic reaction to a pneumococcal or diphtheria vaccine.  Before your child receives this vaccine, tell your doctor if the child was born prematurely.  To make sure you or your child can safely receive this vaccine, tell your doctor if you or your child have any of these other  conditions:  ?? a bleeding or blood clotting disorder such as hemophilia or easy bruising; or  ?? a weak immune system caused by disease, bone marrow transplant, or by using certain medicines or receiving cancer treatments.  You can still receive a vaccine if you have a minor cold. In the case of a more severe illness with a fever or any type of infection, wait until you get better before receiving this vaccine.  How is this vaccine given?  This vaccine is injected into a muscle. You will receive this injection in a doctor's office or clinic setting.  For children, the pneumococcal 13-valent vaccine is given in a series of shots. The first shot is usually given when the child is 2 months old. The booster shots are then given at 4 months, 6 months, and 12 to 15 months of age. Adults usually receive only one dose of the vaccine.  The first injection should be given no earlier than 6 weeks of age. Allow at least 2 months to pass between injections.  If your child is older than 6 months, he or she can still receive this vaccine on the following schedule:  ?? Age 7-11 months: two injections at least 4 weeks apart, followed by a third injection after the child turns 1 year (at least 2 months after the second injection);  ?? Age 12-23 months: two injections at least 2 months apart;  ?? Age 24 months to 5 years (before the 6th birthday): one injection.  The timing of this vaccination is very important for it to be effective. Your child's   individual booster schedule may be different from these guidelines. Follow your doctor's instructions or the schedule recommended by the health department of the state you live in.  Your doctor may recommend treating fever and pain with an aspirin-free pain reliever such as acetaminophen (Tylenol) or ibuprofen (Motrin, Advil, and others) when the shot is given and for the next 24 hours. Follow the label directions or your doctor's instructions about how much of this medicine to give your  child.  It is especially important to prevent fever from occurring in a child who has a seizure disorder such as epilepsy.  Be sure to keep your child on a regular schedule for other immunizations such as diphtheria, tetanus, pertussis (whooping cough), hepatitis, and varicella (chicken pox). Your doctor or state health department can provide you with a recommended immunization schedule.  What happens if I miss a dose?  Contact your doctor if your child will miss a booster dose or gets behind schedule. The next dose should be given as soon as possible. There is no need to start over.  Be sure your child receives all recommended doses of this vaccine. If your child does not receive the full series of vaccines, he or she may not be fully protected against the disease.  What happens if I overdose?  An overdose of this vaccine is unlikely to occur.  What should I avoid before or after receiving this vaccine?  Follow your doctor's instructions about any restrictions on food, beverages, or activity.  What are the possible side effects of this vaccine?  Your child should not receive a booster vaccine if he or she had a life-threatening allergic reaction after the first shot.  Keep track of any and all side effects your child has after receiving this vaccine. When the child receives a booster dose, you will need to tell the doctor if the previous shot caused any side effects.  Get emergency medical help if your child has any of these signs of an allergic reaction: hives; difficulty breathing; swelling of the face, lips, tongue, or throat.  Call your doctor at once if you or your child has a serious side effect such as:  ?? high fever (103 degrees or higher);  ?? seizure (convulsions);  ?? wheezing, trouble breathing;  ?? severe stomach pain, severe vomiting or diarrhea;  ?? easy bruising or bleeding; or  ?? severe pain, itching, irritation, or skin changes where the shot was given.  Less serious side effects include  ?? crying,  fussiness;  ?? headache, tired feeling;  ?? muscle or joint pain;  ?? drowsiness, sleeping more or less than usual;  ?? mild redness, swelling, tenderness, or a hard lump where the shot was given;  ?? loss of appetite, mild vomiting or diarrhea;  ?? low fever (102 degrees or less), chills; or  ?? mild skin rash.  This is not a complete list of side effects and others may occur. Call your doctor for medical advice about side effects. You may report vaccine side effects to the [place] Department of Health and Human Services at 1-800-822-7967.  What other drugs will affect this vaccine?  Before receiving this vaccine, tell the doctor about all other vaccines you or your child have recently received.  Also tell the doctor if you or your child have recently received drugs or treatments that can weaken the immune system, including:  ?? an oral, nasal, inhaled, or injectable steroid medicine;  ?? chemotherapy or radiation;  ?? medications to treat   psoriasis, rheumatoid arthritis, or other autoimmune disorders, such as azathioprine (Imuran), etanercept (Enbrel), leflunomide (Arava), and others; or  ?? medicines to treat or prevent organ transplant rejection, such as basiliximab (Simulect), cyclosporine (Sandimmune, Neoral, Gengraf), muromonab CD3 (Orthoclone), mycophenolate mofetil (CellCept), sirolimus (Rapamune), or tacrolimus (Prograf).  If you are using any of these medications, you may not be able to receive the vaccine, or may need to wait until the other treatments are finished.  There may be other drugs that can interact with pneumococcal 13-valent vaccine. Tell your doctor about all medications you use. This includes prescription, over-the-counter, vitamin, and herbal products. Do not start a new medication without telling your doctor.  Where can I get more information?  Your doctor or pharmacist can provide more information about this vaccine. Additional information is available from your local health department or the Centers  for Disease Control and Prevention.    Remember, keep this and all other medicines out of the reach of children, never share your medicines with others, and use this medication only for the indication prescribed.   Every effort has been made to ensure that the information provided by [employer] ('[employer]') is accurate, up-to-date, and complete, but no guarantee is made to that effect. Drug information contained herein may be time sensitive. [employer] information has been compiled for use by healthcare practitioners and consumers in the [place] and therefore [employer] does not warrant that uses outside of the [place] are appropriate, unless specifically indicated otherwise. [employer]'s drug information does not endorse drugs, diagnose patients or recommend therapy. [employer]'s drug information is an informational resource designed to assist licensed healthcare practitioners in caring for their patients and/or to serve consumers viewing this service as a supplement to, and not a substitute for, the expertise, skill, knowledge and judgment of healthcare practitioners. The absence of a warning for a given drug or drug combination in no way should be construed to indicate that the drug or drug combination is safe, effective or appropriate for any given patient. [employer] does not assume any responsibility for any aspect of healthcare administered with the aid of information [employer] provides. The information contained herein is not intended to cover all possible uses, directions, precautions, warnings, drug interactions, allergic reactions, or adverse effects. If you have questions about the drugs you are taking, check with your doctor, nurse or pharmacist.  Copyright 1996-2014 [employer] Version: 4.01. Revision date: 06/30/2010.  This information does not replace the advice of a doctor. [employer] disclaims any warranty or liability for your use of this information.   Content Version:  10.4.390249

## 2013-08-22 NOTE — Progress Notes (Signed)
Patient to follow up physician in 6 months.  Patient to have prevnar 13 shot while at visit.

## 2013-09-11 LAB — BASIC METABOLIC PANEL
Anion Gap: 8 mmol/L (ref 7–16)
BUN: 23 mg/dL (ref 8–23)
CO2: 40 mmol/L — ABNORMAL HIGH (ref 22–29)
Calcium: 8.7 mg/dL (ref 8.6–10.2)
Chloride: 93 mmol/L — ABNORMAL LOW (ref 98–107)
Creatinine: 1.1 mg/dL (ref 0.7–1.2)
GFR African American: >60
GFR Non-African American: >60 mL/min/{1.73_m2} (ref >=60)
Glucose: 100 mg/dL (ref 74–109)
Potassium: 4.6 mmol/L (ref 3.5–5.0)
Sodium: 141 mmol/L (ref 132–146)

## 2013-09-11 LAB — BRAIN NATRIURETIC PEPTIDE: Pro-BNP: 424 pg/mL — ABNORMAL HIGH (ref 0–125)

## 2014-01-01 NOTE — Progress Notes (Signed)
Patient's Name/Date of Birth: Julian Castaneda / 28-May-1941    Date: 01/01/2014    PCP: [name], MD    Chief Complaint   Patient presents with   ??? EGD     egd recall       HPI: patient seen for evaluation of increasing symptoms of GERD. On omeprazole. Denies dysphagia, no weight loss      Patient's medications, allergies, past medical, surgical, social and family histories were reviewed and updated as appropriate.    Allergies   Allergen Reactions   ??? Nexium [Esomeprazole Magnesium Trihydrate] Swelling   ??? Nsaids Swelling   ??? Reglan [Metoclopramide Hcl] Other (See Comments)     States has bloating with this       Past Medical History   Diagnosis Date   ??? Allergy    ??? Sinusitis    ??? GERD (gastroesophageal reflux disease)    ??? Hyperlipidemia    ??? Sleep apnea      CHM 18/9   ??? Diabetes mellitus (HCC)    ??? Asthma    ??? CSS (Churg-Strauss syndrome) (HCC)    ??? Elevated PSA      negative biospy   ??? Kidney stone         Past Surgical History   Procedure Laterality Date   ??? Gastric fundoplication     ??? Septoplasty     ??? Hernia repair     ??? Nasal polyp surgery     ??? Prostate biopsy  2011     negative pathology   ??? Upper gastrointestinal endoscopy  12/2009     with biopsy, pathology negative   ??? Tumor removal  11/2010     fatty tumor removed Dr Lavone Nian        History   Substance Use Topics   ??? Smoking status: Never Smoker    ??? Smokeless tobacco: Never Used   ??? Alcohol Use: No       Current Outpatient Prescriptions   Medication Sig Dispense Refill   ??? Cholecalciferol (VITAMIN D) 2000 UNITS CAPS capsule Take 2,000 Units by mouth.       ??? VITAMIN E PO Take  by mouth.       ??? EPINEPHrine (EPIPEN 2-PAK) 0.3 MG/0.3ML DEVI injection Inject 0.3 mLs into the muscle once as needed for 1 dose.  1 Device  3   ??? hydrochlorothiazide (HYDRODIURIL) 25 MG tablet Take 25 mg by mouth daily.         ??? Sodium Chloride-Sodium Bicarb (AYR SALINE NASAL RINSE NA) 1 applicator by Nasal route daily.         ??? atorvastatin (LIPITOR) 80 MG tablet Take  80 mg by mouth daily.       ??? azelastine (ASTELIN) 137 MCG/SPRAY nasal spray 2 sprays by Nasal route daily. Use in each nostril as directed       ??? fish oil-omega-3 fatty acids 1000 MG capsule Take 2 g by mouth daily.       ??? XOPENEX HFA 45 MCG/ACT inhaler Inhale 1 puff into the lungs every 8 hours as needed for Wheezing.  1 Inhaler  12     No current facility-administered medications for this visit.           Review of Systems  Constitutional: negative  Eyes: negative  Ears, nose, mouth, throat, and face: negative  Respiratory: negative  Cardiovascular: negative  Gastrointestinal: negative  Genitourinary:negative  Integument/breast: negative  Hematologic/lymphatic: negative  Musculoskeletal:negative  Neurological:  negative  Allergic/Immunologic: negative    Physical exam:  BP 134/78    Pulse 78    Temp(Src) 98.2 ??F (36.8 ??C) (Oral)    Resp 16    Ht 5' 9.5" (1.765 m)    Wt 216 lb (97.977 kg)    BMI 31.45 kg/m2     General appearance: no acute distress  Head:NCAT, EOMI, PERRLA, conjunctiva pink  Neck: no masses, supple  Lungs: CTABL  Heart: RRR  Abdomen: soft, nondistended, nontender, no guarding, no peritoneal signs, normoactive bowel sounds  Extremities:no edema    Assessment/Plan:  .proceed with EGD    The procedure risks, benfits, possible complications and alternative options where explained to the patient, he understands and agrees to proceed with surgery.    Return in about 4 weeks (around 01/29/2014).    Dennie MaizesMounir Janasha Barkalow, MD      Send copy of H&P to PCP, Nechama GuardScott M Agnew, MD

## 2014-01-16 NOTE — Patient Instructions (Signed)
[facility] PRE-ADMISSION TESTING INSTRUCTIONS    The Preadmission Testing patient is instructed accordingly using the following criteria (check applicable):    ARRIVAL INSTRUCTIONS:  [x]  Parking the day of Surgery is located in the Main Entrance lot.  Upon entering the door, make an immediate right to the surgery reception desk    [x]  Bring photo ID and insurance card    []  Bring in a copy of Living will or Durable Power of attorney papers.    [x]  Please be sure to arrange transportation to and from the hospital    [x]  Please arrange for someone to be with you the remainder of the day due to having anesthesia      GENERAL INSTRUCTIONS:    [x]  Nothing by mouth after midnight, including gum, candy, mints or water    [x]  You may brush your teeth, but do not swallow any water    []  Take medications as instructed with 1-2 oz of water    []  Stop herbal supplements and vitamins 5 days prior to procedure    []  Please verify preop dosing of blood thinners with your physician    []  Do not take insulin or oral diabetic medications    []  If diabetic and have low blood sugar or feel symptomatic, take 1-2oz apple juice or glucose tablets    []  Bring inhalers day of surgery    []  Bring C-PAP/ Bi-Pap day of surgery    []  Bring urine specimen day of surgery    []  Antibacterial Soap shower or bath AM of Surgery, no lotion, powders or creams to surgical site    []  Follow bowel prep as instructed per surgeon    []  No smoking within 24 hours of surgery     []  No alcohol or illegal drug use within 24 hours of surgery.    [x]  Jewelry, body piercing's, eyeglasses, contact lenses and dentures are not permitted into surgery (bring cases)      []  Please do not wear any nail polish or make up on the day of surgery    [x]  If not already done, you can expect a call from registration    [x]  If surgeon requests a time change you will be notified the day prior to surgery    [x]  If you receive a survey after surgery we  would greatly appreciate your comments    []  Parent/guardian of a minor must accompany their child and remain on the premises  the entire time they are under our care     []  Pediatric patients may bring favorite toy, blanket or comfort item with them    []  A caregiver or family member must remain with the patient during their stay if they are mentally handicapped, have dementia, disoriented or unable to use a call light or would be a safety concern if left unattended    []  Other instructions      EDUCATIONAL MATERIALS PROVIDED:    []  PAT Preoperative Education Packet/Booklet     []  Medication List    []  Fluoroscopy Information Pamphlet    []  Transfusion bracelet applied with instructions    []  Joint replacement video reviewed    []  Shower with antibacterial soap and use CHG wipes provided the evening before surgery as instructed

## 2014-01-16 NOTE — Progress Notes (Signed)
Patient sees Dr Carmelina PealBarrerio for chronic sinusitis, reactive airway and OSA;  Follows every 6 mos to 1 year, no current problems.

## 2014-01-22 MED ADMIN — 0.9 % sodium chloride infusion: INTRAVENOUS | @ 12:00:00 | NDC 00338004904

## 2014-01-22 MED FILL — PROPOFOL 10 MG/ML IV EMUL: 10 MG/ML | INTRAVENOUS | Qty: 20

## 2014-01-22 NOTE — Op Note (Signed)
BENN, TARVER                                 [id number]      DATE OF PROCEDURE:  01/22/2014      SURGEON:  [name], M.D.      ASSISTANT:      PREOPERATIVE DIAGNOSES:  (1) Heartburn.  (2) History of Barrett's.      POSTOPERATIVE DIAGNOSES:  (1) Barrett esophagus.  (2) Gastroesophageal reflux   disease.  (3) Mild antral gastritis.      OPERATION:  Esophagogastroduodenoscopy with esophageal gastric biopsies.      ANESTHESIA:      ESTIMATED BLOOD LOSS:      COMPLICATIONS:      INDICATIONS:  A 73 year old male patient with a long-standing history of   gastroesophageal reflux disease and Barrett's, status post laparoscopic   Nissen fundoplication about 8 years ago.  The patient was seen in my office   on consultation for a surveillance EGD.      OPERATIVE FINDINGS:  (1) Changes of the GE junction consistent with Barrett   esophagus.  (2) Status post Nissen fundoplication.  (3) Gastroesophageal   reflux disease.  (4) Mild antral gastritis.      DESCRIPTION OF PROCEDURE:  With the patient in the left lateral position on   the operating table, after adequate sedation was administered by Anesthesia,   a standard Olympus gastroscope was introduced through the mouth and advanced   into the esophagus.  Free gastroesophageal reflux was noted.  There were no   esophageal ulcers.  No stricture.  GE junction showed changes of minimal   Barrett's.  No ulcers.  No bleeding.  Photos and biopsies were taken.  Status   post Nissen fundoplication at the GE junction was noted.  The stomach   revealed normal gastric folds.  Mild antral gastritis was noted.  No ulcers.   No bleeding.  No polyps.  Biopsies were taken the antrum stump for H. pylori.   Pyloric opening was normal.  Evaluation of the 1st, 2nd, 3rd, and 4th parts   of the duodenum showed no evidence of ulcers, bleeding, or other pathology.   Scope was slowly withdrawn and totally removed.  The patient tolerated the   procedure well.      RECOMMENDATIONS:   1.     Antireflux precautions.   2.    Continue with maintenance PPI.   3.    Repeat EGD in 3 years or earlier if indicated.            Dictated by:  [name], M.D.            MA / nmt   DD: 01/22/2014   08:25 A    DT: 01/22/2014 10:04 A   [id number]    [id number]   CC:   [name], M.D.         [name], M.D.

## 2014-01-22 NOTE — Anesthesia Pre-Procedure Evaluation (Signed)
Julian Castaneda Tanabe                Department of Anesthesiology  Pre-Anesthesia Evaluation/Consultation        Name:  Julian Castaneda Felten                                         Age:  73 y.o.  MRN:  [id number]           Procedure (Scheduled):  EGD  Surgeon:  [name]     Allergies   Allergen Reactions   ??? Enalapril Swelling     Of lower lip   ??? Esomeprazole Hives and Swelling   ??? Nexium [Esomeprazole Magnesium Trihydrate] Swelling   ??? Nsaids Swelling   ??? Ranitidine Hives and Swelling   ??? Reglan [Metoclopramide Hcl] Other (See Comments)     States has bloating with this     Patient Active Problem List   Diagnosis   ??? Chronic sinusitis   ??? CSS (Churg-Strauss syndrome)   ??? Asthma   ??? Elevated PSA   ??? Environmental allergies   ??? Environmental allergies   ??? Environmental allergies     Past Medical History   Diagnosis Date   ??? Allergy    ??? Sinusitis    ??? GERD (gastroesophageal reflux disease)    ??? Hyperlipidemia    ??? Sleep apnea      CHM 18/9   ??? CSS (Churg-Strauss syndrome) (HCC)      no current issues   ??? Elevated PSA      negative biospy   ??? Asthma      doing well per patient   ??? Diabetes mellitus (HCC)      patient denies     Past Surgical History   Procedure Laterality Date   ??? Gastric fundoplication  2005   ??? Septoplasty     ??? Hernia repair     ??? Nasal polyp surgery     ??? Prostate biopsy  2011     negative pathology   ??? Upper gastrointestinal endoscopy  12/2009     with biopsy, pathology negative   ??? Tumor removal  11/2010     fatty tumor removed Dr Lavone NianAwad     History   Substance Use Topics   ??? Smoking status: Never Smoker    ??? Smokeless tobacco: Never Used   ??? Alcohol Use: No     Medications  Prior to Admission medications    Medication Sig Start Date End Date Taking? Authorizing Provider   amLODIPine (NORVASC) 2.5 MG tablet Take 2.5 mg by mouth nightly   Yes Historical Provider, MD   hydrochlorothiazide (HYDRODIURIL) 25 MG tablet Take 25 mg by mouth daily.     Yes Historical Provider, MD   atorvastatin (LIPITOR) 80 MG  tablet Take 80 mg by mouth daily.   Yes Historical Provider, MD   XOPENEX HFA 45 MCG/ACT inhaler Inhale 1 puff into the lungs every 8 hours as needed for Wheezing. 08/13/09 08/13/10  Elmer Rampimothy Castaneda Barreiro, DO     Current Outpatient Prescriptions   Medication Sig Dispense Refill   ??? amLODIPine (NORVASC) 2.5 MG tablet Take 2.5 mg by mouth nightly       ??? hydrochlorothiazide (HYDRODIURIL) 25 MG tablet Take 25 mg by mouth daily.         ??? atorvastatin (LIPITOR) 80 MG tablet Take 80  mg by mouth daily.       ??? XOPENEX HFA 45 MCG/ACT inhaler Inhale 1 puff into the lungs every 8 hours as needed for Wheezing.  1 Inhaler  12     No current facility-administered medications for this encounter.     Vital Signs  Filed Vitals:    01/16/14 1547   Height: 5' 9.5" (1.765 m)   Weight: 210 lb (95.255 kg)     Vital Signs Statistics (for past 48 hrs)     No Data Recorded  BMI  Estimated body mass index is 30.58 kg/(m^2) as calculated from the following:    Height as of this encounter: 5' 9.5" (1.765 m).    Weight as of this encounter: 210 lb (95.255 kg).    Recent LABS:  CBC  No results for input(s): WBC, HGB, HCT, PLT in the last 72 hours.  BMP  No results for input(s): NA, K, CL, CO2, BUN, CREATININE, GLUCOSE in the last 72 hours.  COAGS  No results for input(s): PROTIME, INR, APTT in the last 72 hours.  POC GLUCOSE  No results for input(s): POCGLU in the last 72 hours.  HCG (If Applicable)   No results found for this basename: PREGTESTUR, PREGSERUM, HCG, HCGQUANT      No results for input(s): PREGTESTUR, PREGSERUM, HCG, HCGQUANT in the last 72 hours.    ABGs  No results for input(s): PO2, PCO2, PH, HCO3, BE, O2SAT in the last 72 hours.    Type & Screen (If Applicable)  No results for input(s): ABORH in the last 72 hours.      Radiology (If Applicable)  Cardiac Testing (If Applicable)   EKG (If Applicable)     ================================================================       Anesthesia Evaluation     Patient summary reviewed and  Nursing notes reviewed    No history of anesthetic complications   Airway   Mallampati: III  TM distance: >3 FB  Neck ROM: full  Dental      Pulmonary - normal exam   (+) asthma, sleep apnea on CPAP ,   Cardiovascular - normal exam  (+) hypertension,   (-) past MI, angina    Neuro/Psych - negative ROS   GI/Hepatic/Renal    (+) GERD,     Endo/Other - negative ROS   Abdominal                   Allergies: Enalapril; Esomeprazole; Nexium; Nsaids; Ranitidine; and Reglan    NPO Status:  > 8 hrs                                                      Anesthesia Plan    ASA 3     MAC     Anesthetic plan and risks discussed with patient.          Jeni Salles, MD  01/22/2014        DOS STAFF ADDENDUM:    Pt seen and examined, chart reviewed (including anesthesia, drug and allergy history).    NPO status confirmed.    Anesthetic plan, risks, benefits and options discussed with patient.    Patient verbalized an understanding and agrees to proceed.     Jeni Salles, MD  Staff Anesthesiologist  7:08 AM

## 2014-01-22 NOTE — H&P (Signed)
Julian SnareVincent J Balicki (MR# [id number])         Progress Notes Info    Author Note Status Last Update User Last Update Date/Time    Dennie MaizesMounir Lorrain Rivers, MD Signed Dennie MaizesMounir Aristide Waggle, MD 01/01/2014 4:00 PM      Progress Notes    Expand All Collapse All     Patient's Name/Date of Birth: Julian Castaneda / [birth year]    Date: 01/01/2014    PCP: Nechama GuardScott M Agnew, MD  Chief Complaint    Patient presents with    ???  EGD      egd recall        HPI: patient seen for evaluation of increasing symptoms of GERD. On omeprazole. Denies dysphagia, no weight loss      Patient's medications, allergies, past medical, surgical, social and family histories were reviewed and updated as appropriate.  Allergies    Allergen  Reactions    ???  Nexium [Esomeprazole Magnesium Trihydrate]  Swelling    ???  Nsaids  Swelling    ???  Reglan [Metoclopramide Hcl]  Other (See Comments)      States has bloating with this       Past Medical History    Past Medical History    Diagnosis  Date    ???  Allergy     ???  Sinusitis     ???  GERD (gastroesophageal reflux disease)     ???  Hyperlipidemia     ???  Sleep apnea       CHM 18/9    ???  Diabetes mellitus (HCC)     ???  Asthma     ???  CSS (Churg-Strauss syndrome) (HCC)     ???  Elevated PSA       negative biospy    ???  Kidney stone            Past Surgical History    Past Surgical History    Procedure  Laterality  Date    ???  Gastric fundoplication      ???  Septoplasty      ???  Hernia repair      ???  Nasal polyp surgery      ???  Prostate biopsy   2011      negative pathology    ???  Upper gastrointestinal endoscopy   12/2009      with biopsy, pathology negative    ???  Tumor removal   11/2010      fatty tumor removed Dr Lavone NianAwad          History    Substance Use Topics    ???  Smoking status:  Never Smoker    ???  Smokeless tobacco:  Never Used    ???  Alcohol Use:  No       Current Facility-Administered Medications    Current Outpatient Prescriptions    Medication  Sig  Dispense  Refill    ???  Cholecalciferol (VITAMIN D) 2000 UNITS CAPS capsule  Take 2,000 Units by  mouth.      ???  VITAMIN E PO  Take by mouth.      ???  EPINEPHrine (EPIPEN 2-PAK) 0.3 MG/0.3ML DEVI injection  Inject 0.3 mLs into the muscle once as needed for 1 dose.  1 Device  3    ???  hydrochlorothiazide (HYDRODIURIL) 25 MG tablet  Take 25 mg by mouth daily.      ???  Sodium Chloride-Sodium Bicarb (AYR SALINE NASAL RINSE NA)  1 applicator by Nasal route daily.      ???  atorvastatin (LIPITOR) 80 MG tablet  Take 80 mg by mouth daily.      ???  azelastine (ASTELIN) 137 MCG/SPRAY nasal spray  2 sprays by Nasal route daily. Use in each nostril as directed      ???  fish oil-omega-3 fatty acids 1000 MG capsule  Take 2 g by mouth daily.      ???  XOPENEX HFA 45 MCG/ACT inhaler  Inhale 1 puff into the lungs every 8 hours as needed for Wheezing.  1 Inhaler  12    No current facility-administered medications for this visit.              Review of Systems  Constitutional: negative  Eyes: negative  Ears, nose, mouth, throat, and face: negative  Respiratory: negative  Cardiovascular: negative  Gastrointestinal: negative  Genitourinary:negative  Integument/breast: negative  Hematologic/lymphatic: negative  Musculoskeletal:negative  Neurological: negative  Allergic/Immunologic: negative    Physical exam:  BP 134/78   Pulse 78   Temp(Src) 98.2 ??F (36.8 ??C) (Oral)   Resp 16   Ht 5' 9.5" (1.765 m)   Wt 216 lb (97.977 kg)   BMI 31.45 kg/m2   General appearance: no acute distress  Head:NCAT, EOMI, PERRLA, conjunctiva pink  Neck: no masses, supple  Lungs: CTABL  Heart: RRR  Abdomen: soft, nondistended, nontender, no guarding, no peritoneal signs, normoactive bowel sounds  Extremities:no edema    Assessment/Plan:  .proceed with EGD    The procedure risks, benfits, possible complications and alternative options where explained to the patient, he understands and agrees to proceed with surgery.    Return in about 4 weeks (around 01/29/2014).    Dennie MaizesMounir Quamere Mussell, MD      Send copy of H&P to PCP, Nechama GuardScott M Agnew, MD        Patient's History and Physical was  reviewed.    Patient examined.    There has been no change.

## 2014-01-22 NOTE — Brief Op Note (Signed)
Brief Postoperative Note    Julian Castaneda  Date of Birth:  [birth year]  [id number]    Pre-operative Diagnosis: gerd    Post-operative Diagnosis: Same    Procedure: egd/bx    Anesthesia: MAC    Surgeons/Assistants: Lyndzee Kliebert    Estimated Blood Loss: less than 50     Complications: None    Specimens: Was Obtained:     Findings:     Electronically signed by [name], MD on 01/22/2014 at 8:21 AM

## 2014-01-22 NOTE — Anesthesia Post-Procedure Evaluation (Signed)
[facility]              POST-OP NOTE      NAME:  Julian SnareVincent J Verne                                         AGE: 73 y.o.  MRN:    [id number]     Last Vitals:  BP 150/80 mmHg   Pulse 50   Temp(Src) 97.5 ??F (36.4 ??C) (Temporal)   Resp 16   Ht 5' 9.5" (1.765 m)   Wt 210 lb (95.255 kg)   BMI 30.58 kg/m2   SpO2 95%    Last Aldrete Score:  Phase I Aldrete Score:  Aldrete Score Phase 1  Activity: Able to move four extremities voluntarily or on command  Respiration: Able to breath and cough freely  Circulation: Blood pressure within 20% of preanesthesia level  Consciousness: Fully awake  Oxygen: SAO2 > 92% on room air   Aldrete Score: 10    Phase II Aldrete Score:  Aldrete Score Phase 2  Activity: Able to move four extremities voluntarily on command  Respiration: Able to breath and cough freely  Circulation: Blood pressure +20-50% of preanesthesia level  Consciousness: Fully awake  Oxygen: SAO2 >92% on room air  Aldrete Score: 9    Last Pain Score Documented: (in Doc Flowsheet)  Pain Level: 0      Level of consciousness: awake    Airway Patency/Respiratory:  stable    Oxygen Saturation:  stable    Cardiovascular: stable    Hydration: adequate    PONV: stable    Post-op pain: adequate analgesia    Post-op assessment: no apparent anesthetic complications    Additional Follow-Up / Treatment / Comment: none    Jeni Sallesonald F Mullis, Jr, MD  Staff Anesthesiologist  January 23, 2014 10:04 AM

## 2014-01-22 NOTE — Progress Notes (Signed)
0900 discharge instructions reviewed;verbalizes understanding  0920 out via wheelchair to awaiting ride

## 2014-01-22 NOTE — Discharge Instructions (Signed)
[facility] AMBULATORY PROCEDURE DISCHARGE INSTRUCTIONS  You may be drowsy or lightheaded after receiving sedation or anesthesia.    A responsible person should be with you for the next 24 hours.    Please follow the instructions checked below:    DIET INSTRUCTIONS:  [x]Start with light diet and progress to your normal diet as you feel like eating. If you experience nausea or repeated episodes of vomiting which persist beyond 12-24 hours, notify your doctor.  []Other     ACTIVITY INSTRUCTIONS:  [x]Rest today. Increase activity as tolerated    []No heavy lifting or strenuous activity     [x]No driving for today  []Other      MEDICATION INSTRUCTIONS:    []Prescriptions sent with you.  Use as directed.  When taking pain medications, you may experience dizziness or drowsiness.  Do not drink alcohol or drive when taking these medications.  [x]Continue preop medications                               Post-procedure Care   If any tissue was removed:   It will be sent to a lab to be examined. It may take 1-2 weeks for results. The doctor will usually give an initial report after the scope is removed. Other tests may be recommended.   A small amount of bleeding may occur during the first few days after the procedure.     When you return home after the procedure, be sure to follow your doctor's instructions, which may include:   Resume medicines as instructed by your doctor.   Resume normal diet, unless directed otherwise by your doctor.   The sedative will make you drowsy. Avoid driving, operating machinery, or making important decisions for the rest of the day.   Rest for the remainder of the day.   Rest when you get home.   Avoid alcohol for the rest of the day.   Be sure to follow your doctor's instructions .     Results   This test gives your doctor information about the health of your digestive system. The results can help to explain your symptoms. You and your doctor will talk about the results and your  treatment plan.     Call Your Doctor   After the test, call your doctor if any of the following occurs:   Signs of infection, including fever and chills   Severe abdominal pain   Hard, swollen abdomen   Difficulty swallowing or breathing   Any change or increase in your original symptoms   Bloody or black tarry colored stools   Nausea and/or vomiting   Cough, shortness of breath, or chest pain   Bleeding     In case of emergency, call 911.        FOLLOW-UP CARE:  [x]Call the office at [phone] for follow-up appointment in 1 week

## 2014-02-07 NOTE — Progress Notes (Signed)
Patient's Name/Date of Birth: Julian Castaneda / 07-02-1940    Date: 02/07/2014    PCP: [name], MD    Chief Complaint   Patient presents with   ??? Follow Up After Procedure     egd       HPI:  Julian Castaneda is a 73 y.o. year old male who returns after2  weeksinterval. Since the last evaluation, Julian Castaneda's symptoms have been better on the prescribed treatment plan. Symptoms currently include:occasional heartburn. Patient denies: nausea, vomiting and dysphagia.    Patient's medications, allergies, past medical, surgical, social and family histories were reviewed and updated as appropriate.    Review of Systems  Constitutional: negative  Eyes: negative  Ears, nose, mouth, throat, and face: negative  Respiratory: negative  Cardiovascular: negative  Gastrointestinal: negative  Genitourinary:negative  Integument/breast: negative  Hematologic/lymphatic: negative  Musculoskeletal:negative  Neurological: negative  Allergic/Immunologic: negative      Physical Exam:  BP 122/80 mmHg   Pulse 78   Temp(Src) 98.1 ??F (36.7 ??C) (Oral)   Resp 16   Ht 5' 9.5" (1.765 m)   Wt 216 lb (97.977 kg)   BMI 31.45 kg/m2  General Appearance: alert and oriented to person, place and time, well-developed and well-nourished, in no acute distress  Oropharynx:  lips, mucosa, and tongue normal; teeth and gums normal   Neck:  no adenopathy, no carotid bruit, no JVD, supple, symmetrical, trachea midline and thyroid not enlarged, symmetric, no tenderness/mass/nodules   Lung:  clear to auscultation bilaterally   Heart:   regular rate and rhythm, S1, S2 normal, no murmur, click, rub or gallop   Abdomen:  soft, non-tender; bowel sounds normal; no masses,  no organomegaly   Extremities:  extremities normal, atraumatic, no cyanosis or edema   Skin:  warm and dry, no hyperpigmentation, vitiligo, or suspicious lesions   Genitourinary:  defer exam     Data Reviewed: surgical pathology results and endoscopy results    Assessment/Plan:  Julian Castaneda was seen today for  follow up after procedure.    Diagnoses and associated orders for this visit:    Gastroesophageal reflux disease without esophagitis      Anti reflux precautions  Continue PPI    Return if symptoms worsen or fail to improve.    [name], MD

## 2014-03-27 ENCOUNTER — Encounter

## 2014-03-27 LAB — SPIROMETRY WITHOUT BRONCHODILATOR
FEF 25-75%-Pre: 1.95
FEV1 %Pred-Pre: 79
FEV1/FVC: 77 %
FEV1: 2.4 L
FVC %Pred-Pre: 75
FVC: 3.13 L

## 2014-03-27 NOTE — Progress Notes (Signed)
Patient to follow up with physician in.  Patient given the flu shot during visit.

## 2014-03-27 NOTE — Progress Notes (Signed)
[facility]    Department of Internal Medicine  Division of Pulmonary, Critical Care & Sleep Medicine    Dear Julian Guard, MD    We had the pleasure of seeing  Julian Castaneda in the [facility] regarding his chronic sinusitis, GERD s/p Nissen, reactive airway disease & obstructive sleep apnea.     HISTORY OF PRESENT ILLNESS:    Julian Castaneda is a 73 y.o. year old male who has a long standing history of allergies, elevated Ig E level around 600 requiring the administration of Xolair therapy. 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 Last level in 06/2010 was negative. The her recall He under when endoscopic surgery on 06/30/07 antrostomy bilateral with ethmoidectomies and left single sphenoidotomies and septoplasty. His treatment is working well, which included nasal rinses, Patanase, Astelin, and Xolair. I will  With his sleep apnea he is very compliant using the BiPAP machine nightly for at least 6 hour. His current pressure are 18/ 9 cm H20.     Since the last visit Julian Castaneda is doing well. We discussed for along time the decline in his wife's memory and health. He discussed the issue surrounding the skin and dry mouth. He has had no pulmonary complaint.  He has been compliant with his CPAP and uses it nightly. His ACT is 22/25. He has no CP, SOB or leg edema. No neurologic symptoms. He has long standing GERD requiring anti-reflux surgery years ago. His last EGD which was negative by Dr. Lavone Nian he has had no repeat complaints. His antireflux surgery anastomosis was stable and clear. He was also found to have a elevated PSA and a recent biopsy done of the prostate which was benign.  His PFTs are stable.  He will return in a 1 year. (his request).        ALLERGIES:    Allergies   Allergen Reactions   ??? Enalapril Swelling     Of lower lip   ??? Esomeprazole Hives and Swelling   ??? Nexium [Esomeprazole Magnesium Trihydrate] Swelling   ??? Nsaids Swelling   ??? Ranitidine Hives and Swelling   ??? Reglan [Metoclopramide Hcl] Other (See Comments)     States has bloating with this       PAST MEDICAL HISTORY:       Diagnosis Date   ??? Allergy    ??? Sinusitis    ??? GERD (gastroesophageal reflux disease)    ??? Hyperlipidemia    ??? Sleep apnea      CHM 18/9   ??? CSS (Churg-Strauss syndrome) (HCC)      no current issues   ??? Elevated PSA      negative biospy   ??? Asthma      doing well per patient   ??? Diabetes mellitus (HCC)      patient denies        MEDICATIONS:   Current Outpatient Prescriptions   Medication Sig Dispense Refill   ??? amLODIPine (NORVASC) 2.5 MG tablet Take 2.5 mg by mouth nightly     ??? hydrochlorothiazide (HYDRODIURIL) 25 MG tablet Take 25 mg by mouth daily.       ??? atorvastatin (LIPITOR) 80 MG tablet Take 80 mg by mouth daily.     ??? XOPENEX HFA 45  MCG/ACT inhaler Inhale 1 puff into the lungs every 8 hours as needed for Wheezing. 1 Inhaler 12     No current facility-administered medications for this visit.       SOCIAL AND OCCUPATIONAL HEALTH:  He is married with four children. The patient is a never smoked  There is not history of TB or TB exposure.  No Etoh Abuse.  There is not asbestos or silica dust exposure.  The patient reports no coal, foundry, quarry or [employer] exposure.  Travel history reveals no trips.  Denies history of recreational or IV drug use. Denies hot tub exposure. The patient has no pets. Hobbies include watching soap opera.    History   Substance Use Topics   ??? Smoking status: Never Smoker    ??? Smokeless tobacco: Never Used   ??? Alcohol Use: No       SURGICAL HISTORY:   Past Surgical History   Procedure Laterality Date   ??? Gastric fundoplication  2005   ??? Septoplasty     ??? Hernia repair     ??? Nasal polyp surgery     ??? Prostate biopsy  2011      negative pathology   ??? Upper gastrointestinal endoscopy  12/2009     with biopsy, pathology negative   ??? Tumor removal  11/2010     fatty tumor removed Dr Lavone NianAwad   ??? Endoscopy, colon, diagnostic  01/22/2014       FAMILY HISTORY:   Family History   Problem Relation Age of Onset   ??? High Blood Pressure Father    ??? High Cholesterol Father    ??? Asthma Sister    ??? Diabetes Sister        REVIEW OF SYSTEMS:   Constitutional: General health good . No weight changes.  No fevers, fatigue or weakness.   Head: Patient denies any history of trauma, convulsive disorder or syncope.    Skin:  Patient denies history of changes in pigmentation, eruptions or pruritus.  No easy bruising or bleeding.  EENT: Patient denies any history of color blindness, photophobia, diplopia, inflammation, cataracts or glaucoma.   Patient denies history of deafness, tinnitus, pain, discharge or recurrent infections.    Patient has a long history of rhinitis, chronic nasal discharge, drainage, & nasal polyps; all requiring surgery. Patient denies history of soreness of mouth or tongue.   Patient denies history of hoarseness, voice changes, sore throats or tonsillitis.    Lymphatic:  Patient denies history of enlargement, inflammation, pain or suppuration.    Cardiovascular:  Patient denies history of palpations, heart murmur, irregular rhythm, chest pain, exertional dyspnea, cyanosis, ascites, rheumatic fever, cold extremities or edema.  Respiration:  Patient denies wheezing, dyspnea, nocturnal dyspnea, cough, hemoptysis, pleurisy, TB or asthma.   Known OSA on [place] 19/ 8 cm H20. 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   Gastrointestinal: Patient denies changes in appetite. No dysphagia, odynophagia or abdominal pain.  No hematochezia, melena, bowel habit changes or hemorrhoids.    No jaundice. GERD Gastic surgery in past with poor results.   Genitourinary:   Patient denies dysuria, frequency, urgency or incontinence.  Complains of difficulty in  starting or stopping urinary stream.    Reports history of stones which is followed by [facility].  He has benign prostatic hypertrophy but no prostate cancer. Recent Biopsy.   Musculoskeletal: The patient denies history of arthritis or joint pains.  No loss of strength or dislocation.  Breasts:  No history of masses, lumps, pain, nipple changes or painful nipples.  Neurological: Patient denies vertigo, syncope.  No twitching or memory.   Psychological: Patient denies moodiness, depression or anxiety.  No obsessions or hallucinations.    Endocrine:  No history of goiter, exophthalmos or dryness of skin.  No polyuria or polyphagia.  No diabetes.   Hematopoietic:  No history of bleeding disorders or easy bruising.  Rheumatic:  No polyarthritis or inflammatory joint disease. History of positive P-ANCA    PHYSICAL EXAMINATION:  Filed Vitals:    03/27/14 1026 03/27/14 1128   BP: 136/67    Pulse: 58    Temp: 97.6 ??F (36.4 ??C) 97.6 ??F (36.4 ??C)   TempSrc: Oral    Resp: 14    Height: 5\' 9"  (1.753 m)    Weight: 212 lb (96.163 kg)    SpO2: 94%      Constitutional: A 73 y.o.  male  who is alert, oriented, cooperative and in no apparent distress.  Head was normocephalic and atraumatic.  Male patten baldness.   EENT: Eyes were equal and reactive to light and accommodation.  Extraocular muscles intact.  No conjunctival injections.  External canals are patent.   No discharge was appreciated.  Septum was mildly deviated, mucosa was without erythema, exudates or cobblestoning.  No thrush was noted.  Swollen turbinates.   Neck: Supple without thyromegaly. No elevated JVP. Trachea was midline. No carotid bruits.    Respiratory: Symmetrical without dullness to percussion.  Breath sounds bilaterally were clear to auscultation.   No wheezes, rhonchi or rales. No intercostal retraction or use of accessory muscles. No egophony noted.   Cardiovascular: Regular without murmur, clicks, gallops or rubs.  No left or right ventricular heave.     Pulses:  Equal bilaterally.    Abdomen: Slightly rounded, soft without organomegaly. No rebound, rigidity or guarding.  Surgical (+) scars  Lymphatic: No lymphadenopathy.  Musculoskeletal:No gross muscle weakness.    Extremities:  No lower extremity edema.  Muscle size, tone and strength are normal.    Sensory function appears intact.  Deep tendon reflexes are normal.   Skin:  Warm and dry.  Good color, turgor and pigmentation. No lesions. Varicoties noted. No rash.   Neurological/Psychiatric: The patient's general behavior, level of consciousness, thought content and emotional status is normal.  Cranial nerves II-XII are intact.      DATA:     Today Spirometry compared to previous demonstrates an FVC of 3.13  which is 75 % of predicted with an FEV1 of  2.40 liters which is 79 % of predicted.  FEV1/FVC ratio is 77%.  Maximum voluntary ventilation is 128 liters per minute or 99 % of predicted.  Flow volume loop shows no signs of intrathoracic or extrathoracic obstruction. Impression. Realatively Normal.    Asthma Control Test [ACT] is a 5 part questionnaire, which has a point value system that ranges from 1- 5. This test is a validated questionnaire to help determine asthma control.  A total score of 19 or below indicates that the patients asthma may not be well controlled. If used in conjunction with PFTs, the correlation of determining asthma control is even stronger.  Today on testing the ACT was 23 out of max of 25. Impression: Poor control.     Epworth Sleepiness Scale is use this Tool to Measure Sleep Deprivation. The Epworth Sleepiness Scale was developed by researchers in [place] and is widely used by sleep professionals around the world to measure  sleep deprivation.  How likely are you to doze off or fall asleep in the following situations, in contrast to feeling just tired? This refers to your usual way of life in recent times.  Results: Total Epworth Score: 3  Interpretation: No  hypersomnolence.    IMPRESSION:      Julian Castaneda a 73 y.o. 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 While we have never proven the systemic vasculitis or necrotizing vasculitis part and he has no renal, neuropathic or cardiovascular involvement; the finding of chronic sinusitis, eosinophilia, pANCA (+) and asthma aid in the diagnosis.  He has no neuropathy or neuropathic mononeuritis. His p-ANCA was 1:160 but fluctuates .  However, the clinical utility of ANCA in CSS raises a controversial issue of the sensitivity and specificity of ANCA in systemic vasculitis. In CSS P-ANCA is 50%.  He should never receive leukotriene modifier, allopurinol, propylthiouracil and quinine with this history of CSS for it will cause active flairs?.     His chronic allergies, sinusitis with rhinitis and nasal  polyposis is stable after surgery. He also has a failed Nissen fundoplication with chronic gastroparesis and GERD stable based on last. As for his sleep apnea he is doing very well with his BiPAP.  With this in mind, we would like to proceed with the following;                PLAN:    We have stopped the Xolair for his allergies and elevated IgE level and he is without a decline. Sleep apnea symptoms are excellent and we see no need to adjust therapy.  We refilled his medications and will update you once the testing is completed. He will follow up in 6 months to a year for re-evaluation.  Influenzal vaccines were given.  We hope this updates you on my evaluation and clinical thinking. Thank you for allowing [place] to participate in [name] care.     Sincerely,        Elmer Ramp, D.O., [employer], FACP  Associate Professor of Medicine  Director, [facility]

## 2014-03-27 NOTE — Patient Instructions (Signed)
Influenza (Flu) Vaccine (Inactivated or Recombinant): What You Need to Know 2014-15  Why get vaccinated?  Influenza ("flu") is a contagious disease that spreads around the [place] every winter, usually between October and May.  Flu is caused by influenza viruses and is spread mainly by coughing, sneezing, and close contact.  Anyone can get the flu, but the risk of getting flu is highest among children. Symptoms come on suddenly and may last several days. They can include:  ?? Fever/chills.  ?? Sore throat.  ?? Muscle aches.  ?? Fatigue.  ?? Cough.  ?? Headache.  ?? Runny or stuffy nose.  Flu can make some people much sicker than others. These people include young children, people 65 and older, pregnant women, and people with certain health conditions--such as heart, lung or kidney disease; nervous system disorders; or a weakened immune system. Flu vaccination is especially important for these people and anyone in close contact with them.  Flu can also lead to pneumonia and can make existing medical conditions worse. It can cause diarrhea and seizures in children.  Each year thousands of people in the [place] die from flu, and many more are hospitalized.  Flu vaccine is the best protection we have from flu and its complications. Flu vaccine also helps prevent spreading flu from person to person.  Inactivated and recombinant flu vaccines  You are getting an injectable flu vaccine, which is either an "inactivated" or "recombinant" vaccine. These vaccines do not contain any live influenza virus. They are given by injection with a needle and are often called the "flu shot."  A different, live, attenuated (weakened) influenza vaccine is sprayed into the nostrils. This vaccine is described in a separate Vaccine Information Statement.  Flu vaccination is recommended every year. Some children 6 months through 8 years of age might need two doses during one year.  Flu viruses are always changing. Each year's flu vaccine  is made to protect against three or four viruses that are likely to cause disease that year. Flu vaccine cannot prevent all cases of flu, but it is the best defense against the disease.  It takes about 2 weeks for protection to develop after the vaccination, and protection lasts several months to a year.  Some illnesses that are not caused by influenza virus are often mistaken for flu. Flu vaccine will not prevent these illnesses. It can only prevent influenza.  Some inactivated flu vaccine contains a very small amount of a mercury-based preservative called thimerosal. Studies have shown that thimerosal in vaccines is not harmful, but flu vaccines that do not contain a preservative are available.  Some people should not get this vaccine  Tell the person who gives you the vaccine:  ?? If you have any severe (life-threatening) allergies. If you ever had a life-threatening allergic reaction after a dose of flu vaccine or have a severe allergy to any part of this vaccine, including (for example) an allergy to gelatin, antibiotics, or eggs, you may be advised not to get vaccinated. Most, but not all, types of flu vaccine contain a small amount of egg protein.  ?? If you ever had Guillain-Barr?? syndrome (a severe paralyzing illness, also called GBS). Some people with a history of GBS should not get this vaccine. This should be discussed with your doctor.  ?? If you are not feeling well. It is usually okay to get flu vaccine when you have a mild illness, but you might be advised to wait until you feel   better. You should come back when you are better.  Risks of a vaccine reaction  With a vaccine, like any medicine, there is a chance of side effects. These are usually mild and go away on their own.  Problems that could happen after any vaccine:  ?? Brief fainting spells can happen after any medical procedure, including vaccination. Sitting or lying down for about 15 minutes can help prevent fainting and the injuries caused by a  fall. Tell your doctor if you feel dizzy or have vision changes or ringing in the ears.  ?? Severe shoulder pain and reduced range of motion in the arm where a shot was given can happen, very rarely, after a vaccination.  ?? Severe allergic reactions from a vaccine are very rare, estimated at less than 1 in a million doses. If one were to occur, it would usually be within a few minutes to a few hours after the vaccination.  Mild problems following inactivated flu vaccine:  ?? Soreness, redness, or swelling where the shot was given  ?? Hoarseness  ?? Sore, red or itchy eyes  ?? Cough  ?? Fever  ?? Aches  ?? Headache  ?? Itching  ?? Fatigue  If these problems occur, they usually begin soon after the shot and last 1or 2 days.  Moderate problems following inactivated flu vaccine:  ?? Young children who get inactivated flu vaccine and pneumococcal vaccine (PCV13) at the same time may be at increased risk for seizures caused by fever. Ask your doctor for more information. Tell your doctor if a child who is getting flu vaccine has ever had a seizure.  Inactivated flu vaccine does not contain live flu virus, so you cannot get the flu from this vaccine.  As with any medicine, there is a very remote chance of a vaccine causing a serious injury or death.  The safety of vaccines is always being monitored. For more information, visit: www.cdc.gov/vaccinesafety/  What if there is a serious reaction?  What should I look for?  ?? Look for anything that concerns you, such as signs of a severe allergic reaction, very high fever, or behavior changes.  Signs of a severe allergic reaction can include hives, swelling of the face and throat, difficulty breathing, a fast heartbeat, dizziness, and weakness. These would usually start a few minutes to a few hours after the vaccination.  What should I do?  ?? If you think it is a severe allergic reaction or other emergency that can't wait, call 9-1-1 and get the person to the nearest hospital. Otherwise,  call your doctor.  ?? Afterward, the reaction should be reported to the "Vaccine Adverse Event Reporting System" (VAERS). Your doctor should file this report, or you can do it yourself through the VAERS website at www.vaers.hhs.gov, or by calling 1-800-822-7967.  VAERS does not give medical advice.  The National Vaccine Injury Compensation Program  The National Vaccine Injury Compensation Program (VICP) is a federal program that was created to compensate people who may have been injured by certain vaccines.  Persons who believe they may have been injured by a vaccine can learn about the program and about filing a claim by calling 1-800-338-2382 or visiting the VICP website at www.hrsa.gov/vaccinecompensation. There is a time limit to file a claim for compensation.  How can I learn more?  ?? Ask your doctor.  ?? Call your local or state health department.  ?? Contact the Centers for Disease Control and Prevention (CDC):  ?? Call 1-800-232-4636 (  1-800-CDC-INFO) or  ?? Visit CDC's website at www.cdc.gov/flu  Vaccine Information Statement (Interim)  Inactivated Influenza Vaccine  (01/31/2013)  42 U[place] ?? 300aa-26  Department of Health and Human Services  Centers for Disease Control and Prevention  Many Vaccine Information Statements are available in Spanish and other languages. See www.immunize.org/vis.  Muchas hojas de informaci??n sobre vacunas est??n disponibles en espa??ol y en otros idiomas. Visite www.immunize.org/vis.  Content Version: 10.6.465758

## 2014-07-14 ENCOUNTER — Emergency Department: Admit: 2014-07-14 | Primary: Family Medicine

## 2014-07-14 ENCOUNTER — Inpatient Hospital Stay
Admit: 2014-07-14 | Discharge: 2014-07-14 | Disposition: A | Discharge status: Home / Self Care | Attending: Emergency Medicine

## 2014-07-14 DIAGNOSIS — R202 Paresthesia of skin: Secondary | ICD-10-CM

## 2014-07-14 LAB — CBC WITH AUTO DIFFERENTIAL
Basophils %: 1 % (ref 0–2)
Basophils Absolute: 0.03 E9/L (ref 0.00–0.20)
Eosinophils %: 2 % (ref 0–6)
Eosinophils Absolute: 0.08 E9/L (ref 0.05–0.50)
Hematocrit: 42.5 % (ref 37.0–54.0)
Hemoglobin: 14.1 g/dL (ref 12.5–16.5)
Lymphocytes %: 28 % (ref 20–42)
Lymphocytes Absolute: 1.39 E9/L — ABNORMAL LOW (ref 1.50–4.00)
MCH: 28.8 pg (ref 26.0–35.0)
MCHC: 33.3 % (ref 32.0–34.5)
MCV: 86.5 fL (ref 80.0–99.9)
MPV: 9.6 fL (ref 7.0–12.0)
Monocytes %: 8 % (ref 2–12)
Monocytes Absolute: 0.38 E9/L (ref 0.10–0.95)
Neutrophils %: 61 % (ref 43–80)
Neutrophils Absolute: 3 E9/L (ref 1.80–7.30)
Platelets: 137 E9/L (ref 130–450)
RBC: 4.92 E12/L (ref 3.80–5.80)
RDW: 14.3 fL (ref 11.5–15.0)
WBC: 4.9 E9/L (ref 4.5–11.5)

## 2014-07-14 LAB — EKG 12-LEAD
ECG Heart Rate: 56 /min
ECG P Duration: 162 ms
ECG QRS Axis: -22 deg
ECG QRS Duration: 98 ms
ECG QT Dispersion: 30 ms
ECG QTC Interval: 396 ms
ECG RR Interval: 1071 ms
P Axis: -33 deg
P-R Interval: 282 ms
Q-T Interval: 410 ms
T Axis: 18 deg

## 2014-07-14 LAB — COMPREHENSIVE METABOLIC PANEL
ALT: 18 U/L (ref 0–40)
AST: 20 U/L (ref 0–39)
Albumin: 4 g/dL (ref 3.5–5.2)
Alkaline Phosphatase: 81 U/L (ref 40–129)
Anion Gap: 12 mmol/L (ref 7–16)
BUN: 20 mg/dL (ref 8–23)
CO2: 27 mmol/L (ref 22–29)
Calcium: 9.2 mg/dL (ref 8.6–10.2)
Chloride: 102 mmol/L (ref 98–107)
Creatinine: 1 mg/dL (ref 0.7–1.2)
GFR African American: >60
GFR Non-African American: >60 mL/min/{1.73_m2} (ref >=60)
Glucose: 106 mg/dL (ref 74–109)
Potassium: 4.3 mmol/L (ref 3.5–5.0)
Sodium: 141 mmol/L (ref 132–146)
Total Bilirubin: 1.6 mg/dL — ABNORMAL HIGH (ref 0.0–1.2)
Total Protein: 6.5 g/dL (ref 6.4–8.3)

## 2014-07-14 LAB — PROTIME-INR
INR: 1
Protime: 10.4 s (ref 9.3–12.4)

## 2014-07-14 LAB — APTT: aPTT: 29.1 s (ref 24.5–35.1)

## 2014-07-14 LAB — CK-MB: CK-MB: 3.9 ng/mL (ref 0.0–7.7)

## 2014-07-14 LAB — TROPONIN: Troponin: <0.01 ng/mL (ref 0.00–0.03)

## 2014-07-14 MED ORDER — PREDNISONE 20 MG PO TABS
20 MG | ORAL_TABLET | ORAL | Status: AC
Start: 2014-07-14 — End: 2014-07-24

## 2014-07-14 NOTE — ED Provider Notes (Signed)
HPI:  07/14/14, Time: 6:15 AM         Julian Castaneda is a 74 y.o. male presenting to the ED for Numbness tingling, beginning 1-2 weeks ago.  The complaint has been persistent, moderate in severity, and worsened by nothing.  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 Occasionally feels numb/tingling in forearm. Denies any pain. He states it has been persistent over at least the past week. He states that he is right-handed. He states that in the past when he has done painting he has had similar symptoms afterwards. He states there is no pain. He denies any pain in the elbow or shoulder. He states there has been no numbness or tingling above his elbow. He states that there is no neck pain. He has no headache. He denies any vision changes, difficulty speaking, slurred speech, or weakness. He denies chest pain or shortness of breath.    ROS:   Pertinent positives and negatives are stated within HPI, all other systems reviewed and are negative.  --------------------------------------------- PAST HISTORY ---------------------------------------------  Past Medical History:  has a past medical history of Allergy; Sinusitis; GERD (gastroesophageal reflux disease); Hyperlipidemia; Sleep apnea; CSS (Churg-Strauss syndrome) (HCC); Elevated PSA; Asthma; Diabetes mellitus (HCC); and OSA treated with BiPAP.    Past Surgical History:  has past surgical history that includes Gastric fundoplication (2005); Septoplasty; hernia repair; Nasal polyp surgery; Prostate biopsy (2011); Upper gastrointestinal endoscopy (12/2009); tumor removal (11/2010); and Endoscopy, colon, diagnostic (01/22/2014).    Social History:  reports that he has never smoked. He has never used smokeless tobacco. He reports that he does not  drink alcohol or use illicit drugs.    Family History: family history includes Asthma in his sister; Diabetes in his sister; High Blood Pressure in his father; High Cholesterol in his father.     The patient???s home medications have been reviewed.    Allergies: Enalapril; Esomeprazole; Nexium; Nsaids; Ranitidine; and Reglan    ---------------------------------------------------PHYSICAL EXAM--------------------------------------    Constitutional/General: Alert and oriented x3, well appearing, non toxic in NAD  Head: Normocephalic and atraumatic  Eyes: PERRL, EOMI  Mouth: Oropharynx clear, handling secretions, no trismus  Neck: Supple, full ROM, non tender to palpation in the midline, no stridor, no crepitus, no meningeal signs  Pulmonary: Lungs clear to auscultation bilaterally, no wheezes, rales, or rhonchi. Not in respiratory distress  Cardiovascular:  Regular rate. Regular rhythm. No murmurs, gallops, or rubs. 2+ distal pulses  Chest: no chest wall tenderness  Abdomen: Soft.  Non tender. Non distended.  +BS.  No rebound, guarding, or rigidity. No pulsatile masses appreciated.  Musculoskeletal: Moves all extremities x 4. Warm and well perfused, no clubbing, cyanosis, or edema. Capillary refill <3 seconds  No cervical, thoracic, lumbar tenderness.  FROM of right shoulder, elbow, and wrist without tenderness. No swelling or erythema of the right upper extremity/joints. No ecchymosis.  Patient does notice increase in numbness/tingling in right wrist/hand with phalen's test.   Skin: warm and dry. No rashes.   Neurologic: GCS 15, CN 2-12 grossly intact, no focal deficits, symmetric strength 5/5 in the upper and lower extremities bilaterally  Psych: Normal Affect    -------------------------------------------------- RESULTS -------------------------------------------------  I have personally reviewed all laboratory and imaging results for this patient. Results are  listed below.     LABS:  Results for orders placed or  performed during the hospital encounter of 07/14/14   APTT   Result Value Ref Range    aPTT 29.1 24.5 - 35.1 sec   Protime-INR   Result Value Ref Range    Protime 10.4 9.3 - 12.4 sec    INR 1.0    CBC auto differential   Result Value Ref Range    WBC 4.9 4.5 - 11.5 E9/L    RBC 4.92 3.80 - 5.80 E12/L    Hemoglobin 14.1 12.5 - 16.5 g/dL    Hematocrit 78.442.5 69.637.0 - 54.0 %    MCV 86.5 80.0 - 99.9 fL    MCH 28.8 26.0 - 35.0 pg    MCHC 33.3 32.0 - 34.5 %    RDW 14.3 11.5 - 15.0 fL    Platelets 137 130 - 450 E9/L    MPV 9.6 7.0 - 12.0 fL    Neutrophils Relative 61 43 - 80 %    Lymphocytes Relative 28 20 - 42 %    Monocytes Relative 8 2 - 12 %    Eosinophils Relative Percent 2 0 - 6 %    Basophils Relative 1 0 - 2 %    Neutrophils Absolute 3.00 1.80 - 7.30 E9/L    Lymphocytes Absolute 1.39 (L) 1.50 - 4.00 E9/L    Monocytes Absolute 0.38 0.10 - 0.95 E9/L    Eosinophils Absolute 0.08 0.05 - 0.50 E9/L    Basophils Absolute 0.03 0.00 - 0.20 E9/L   CKMB   Result Value Ref Range    CK-MB 3.9 0.0 - 7.7 ng/mL   Troponin   Result Value Ref Range    Troponin <0.01 0.00 - 0.03 ng/mL   Comprehensive metabolic panel   Result Value Ref Range    Sodium 141 132 - 146 mmol/L    Potassium 4.3 3.5 - 5.0 mmol/L    Chloride 102 98 - 107 mmol/L    CO2 27 22 - 29 mmol/L    Anion Gap 12 7 - 16 mmol/L    Glucose 106 74 - 109 mg/dL    BUN 20 8 - 23 mg/dL    CREATININE 1.0 0.7 - 1.2 mg/dL    GFR Non-African American >60 >=60 mL/min/1.73    GFR African American >60     Calcium 9.2 8.6 - 10.2 mg/dL    Total Protein 6.5 6.4 - 8.3 g/dL    Alb 4.0 3.5 - 5.2 g/dL    Total Bilirubin 1.6 (H) 0.0 - 1.2 mg/dL    Alkaline Phosphatase 81 40 - 129 U/L    ALT 18 0 - 40 U/L    AST 20 0 - 39 U/L   EKG 12 lead   Result Value Ref Range    ECG Report       Sinus bradycardia with 1st degree blockLeftward axisLeft ventricular hypertrophy by voltage   onlyAbnormal ECGNO PREVIOUS TRACING      PHYSICIAN  Dacoda Spallone LaGroux     ECG Heart Rate 56 /min    ECG RR Interval 1071 ms     ECG P Duration 162 ms    ECG QRS Duration 98 ms    P-R Interval 282 ms    Q-T Interval 410 ms    ECG QTC Interval 396 ms    ECG QT Dispersion 30 ms    P Axis -33 deg    ECG QRS Axis -22 deg  T Axis 18 deg       RADIOLOGY:  Interpreted by Radiologist.  XR Chest Limited One View portable   Final Result   IMPRESSION: No acute cardiopulmonary process.      CT Head WO Contrast   Final Result   IMPRESSION:          Paranasal sinus disease.   No acute intracranial abnormality is seen.  No recent intracranial hemorrhage.               This Final report was electronically signed by Estanislado Spire, MD on 14 Jul 2014 7:31 AM EDT.            EKG Interpretation  Interpreted by emergency department physician    Rhythm: sinus bradycardia ? 1st degree block  Rate: normal  Axis: left  Conduction: normal  ST Segments: no acute change  T Waves: no acute change    Clinical Impression: non-specific EKG  Comparison to prior EKG: None      ------------------------- NURSING NOTES AND VITALS REVIEWED ---------------------------   The nursing notes within the ED encounter and vital signs as below have been reviewed by myself.  BP 147/88 mmHg   Pulse 54   Temp(Src) 98 ??F (36.7 ??C) (Oral)   Resp 16   Ht  (1.753 m)   Wt 197 lb (89.359 kg)   BMI 29.08 kg/m2   SpO2 97%  Oxygen Saturation Interpretation: Normal    The patient???s available past medical records and past encounters were reviewed.        ------------------------------ ED COURSE/MEDICAL DECISION MAKING----------------------  Medications - No data to display          Medical Decision Making:    Labs, imaging ordered. Discussed all results. With history of painting/repetitive movements - ? Tendonitis/inflammation, he has had similar symptoms with previous painting and he is right handed. Will d/c home on steroids - he is allergic to nsaids. Will have him follow up closely with primary physician and instructed to return to the ED for new/worsening symptoms. He is agreeable to  plan.    Re-Evaluations:           Time: 730 am  Re-evaluation.  Patient???s symptoms show no change. Sitting in bed, reading the paper, no distress.      Consultations:                 Critical Care:         This patient's ED course included: re-evaluation prior to disposition, multiple bedside re-evaluations and a personal history and physicial eaxmination    This patient has remained hemodynamically stable during their ED course.    Counseling:   The emergency provider has spoken with the patient and discussed today???s results, in addition to providing specific details for the plan of care and counseling regarding the diagnosis and prognosis.  Questions are answered at this time and they are agreeable with the plan.       --------------------------------- IMPRESSION AND DISPOSITION ---------------------------------    IMPRESSION  1. Paresthesias in right hand    2. Overuse syndrome of right hand, initial encounter        DISPOSITION  Disposition: Discharge to home  Patient condition is stable        NOTE: This report was transcribed using voice recognition software. Every effort was made to ensure accuracy; however, inadvertent computerized transcription errors may be present          Gerard Bonus Conkle-Lagroux, DO  07/14/14  [id number]

## 2014-09-24 NOTE — Telephone Encounter (Signed)
Error

## 2014-10-03 ENCOUNTER — Ambulatory Visit
Admit: 2014-10-03 | Discharge: 2014-10-03 | Payer: MEDICARE | Attending: Critical Care Medicine | Primary: Family Medicine

## 2014-10-03 DIAGNOSIS — J4520 Mild intermittent asthma, uncomplicated: Secondary | ICD-10-CM

## 2014-10-03 LAB — SPIROMETRY WITHOUT BRONCHODILATOR
FEF 25-75% PRE PRED: 111
FEF 25-75%-Pre: 2.44
FEV1 %Pred-Pre: 87
FEV1/FVC Pred: 73
FEV1/FVC: 80 %
FEV1: 2.63 L
FEV3 PRE: 3.13
FVC %Pred-Pre: 80
FVC: 3.3 L

## 2014-10-03 LAB — POCT NITRIC OXIDE: Parts Per Billion: 25

## 2014-10-03 NOTE — Progress Notes (Signed)
[facility]    Department of Internal Medicine  Division of Pulmonary, Critical Care & Sleep Medicine    Dear Nechama GuardScott M Agnew, MD    We had the pleasure of seeing  [facility]incent [facility] regarding his chronic sinusitis, GERD s/p Nissen, reactive airway disease & obstructive sleep apnea.     HISTORY OF PRESENT ILLNESS:    Julian Castaneda is a 74 y.o. year old male who has a long standing history of allergies, elevated Ig E level around 600 requiring the administration of Xolair therapy. 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 Last level in 06/2010 was negative. The her recall He under when endoscopic surgery on 06/30/07 antrostomy bilateral with ethmoidectomies and left single sphenoidotomies and septoplasty. His treatment is working well, which included nasal rinses, Patanase, Astelin, and Xolair. I will  With his sleep apnea he is very compliant using the BiPAP machine nightly for at least 6 hour. His current pressure are 18/ 9 cm H20.     Since the last visit Julian Castaneda is doing well. We discussed for along time the decline in his wife's memory and health. He discussed the issue surrounding the skin and dry mouth. He has had no pulmonary complaint.  He has been compliant with his CPAP and uses it nightly. His ACT is 22/25. He has no CP, SOB or leg edema. No neurologic symptoms. He has long standing GERD requiring anti-reflux surgery years ago. His last EGD which was negative by [name] he has had no repeat complaints. His antireflux surgery anastomosis was stable and clear. He was also found to have a elevated PSA and a recent biopsy done of the prostate which was benign.  His PFTs are stable.  He will return in a 1 year. (his request).  We  discussed and reviewed his ER visit.       ALLERGIES:    Allergies   Allergen Reactions   ??? Enalapril Swelling     Of lower lip   ??? Esomeprazole Hives and Swelling   ??? Nexium [Esomeprazole Magnesium Trihydrate] Swelling   ??? Nsaids Swelling   ??? Ranitidine Hives and Swelling   ??? Reglan [Metoclopramide Hcl] Other (See Comments)     States has bloating with this       PAST MEDICAL HISTORY:       Diagnosis Date   ??? Allergy    ??? Sinusitis    ??? GERD (gastroesophageal reflux disease)    ??? Hyperlipidemia    ??? Sleep apnea      CHM 18/9   ??? CSS (Churg-Strauss syndrome) (HCC)      no current issues   ??? Elevated PSA      negative biospy   ??? Asthma      doing well per patient   ??? Diabetes mellitus (HCC)      patient denies   ??? OSA treated with BiPAP 03/27/2014        MEDICATIONS:   Current Outpatient Prescriptions   Medication Sig Dispense Refill   ??? amLODIPine (NORVASC) 2.5 MG tablet Take 2.5 mg by mouth nightly     ??? hydrochlorothiazide (HYDRODIURIL) 25 MG tablet Take 25 mg by mouth daily.       ??? atorvastatin (LIPITOR) 80  MG tablet Take 80 mg by mouth daily.     ??? XOPENEX HFA 45 MCG/ACT inhaler Inhale 1 puff into the lungs every 8 hours as needed for Wheezing. 1 Inhaler 12     No current facility-administered medications for this visit.       SOCIAL AND OCCUPATIONAL HEALTH:  He is married with four children. The patient is a never smoked  There is not history of TB or TB exposure.  No Etoh Abuse.  There is not asbestos or silica dust exposure.  The patient reports no coal, foundry, quarry or [employer] exposure.  Travel history reveals no trips.  Denies history of recreational or IV drug use. Denies hot tub exposure. The patient has no pets. Hobbies include watching soap opera.    History   Substance Use Topics   ??? Smoking status: Never Smoker    ??? Smokeless tobacco: Never Used   ??? Alcohol Use: No       SURGICAL HISTORY:   Past Surgical History   Procedure Laterality Date   ??? Gastric fundoplication  2005   ??? Septoplasty     ???  Hernia repair     ??? Nasal polyp surgery     ??? Prostate biopsy  2011     negative pathology   ??? Upper gastrointestinal endoscopy  12/2009     with biopsy, pathology negative   ??? Tumor removal  11/2010     fatty tumor removed Dr Lavone Nian   ??? Endoscopy, colon, diagnostic  01/22/2014       FAMILY HISTORY:   Family History   Problem Relation Age of Onset   ??? High Blood Pressure Father    ??? High Cholesterol Father    ??? Asthma Sister    ??? Diabetes Sister        REVIEW OF SYSTEMS:   Constitutional: General health good . No weight changes.  No fevers, fatigue or weakness.   Head: Patient denies any history of trauma, convulsive disorder or syncope.    Skin:  Patient denies history of changes in pigmentation, eruptions or pruritus.  No easy bruising or bleeding.  EENT: Patient denies any history of color blindness, photophobia, diplopia, inflammation, cataracts or glaucoma.   Patient denies history of deafness, tinnitus, pain, discharge or recurrent infections.    Patient has a long history of rhinitis, chronic nasal discharge, drainage, & nasal polyps; all requiring surgery. Patient denies history of soreness of mouth or tongue.   Patient denies history of hoarseness, voice changes, sore throats or tonsillitis.    Lymphatic:  Patient denies history of enlargement, inflammation, pain or suppuration.    Cardiovascular:  Patient denies history of palpations, heart murmur, irregular rhythm, chest pain, exertional dyspnea, cyanosis, ascites, rheumatic fever, cold extremities or edema.  Respiration:  Patient denies wheezing, dyspnea, nocturnal dyspnea, cough, hemoptysis, pleurisy, TB or asthma.   Known OSA on [place] 19/ 8 cm H20. 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   Gastrointestinal: Patient denies changes in appetite. No dysphagia, odynophagia or abdominal pain.  No hematochezia, melena, bowel habit changes or hemorrhoids.    No jaundice. GERD Gastic surgery in past with poor results.   Genitourinary:   Patient denies  dysuria, frequency, urgency or incontinence.  Complains of difficulty in starting or stopping urinary stream.    Reports history of stones which is followed by [facility].  He has benign prostatic hypertrophy but no prostate cancer. Recent Biopsy.   Musculoskeletal: The patient denies  history of arthritis or joint pains.  No loss of strength or dislocation.  Breasts:  No history of masses, lumps, pain, nipple changes or painful nipples.  Neurological: Patient denies vertigo, syncope.  No twitching or memory.   Psychological: Patient denies moodiness, depression or anxiety.  No obsessions or hallucinations.    Endocrine:  No history of goiter, exophthalmos or dryness of skin.  No polyuria or polyphagia.  No diabetes.   Hematopoietic:  No history of bleeding disorders or easy bruising.  Rheumatic:  No polyarthritis or inflammatory joint disease. History of positive P-ANCA    PHYSICAL EXAMINATION:  Filed Vitals:    10/03/14 1043   Pulse: 54   Temp: 96.7 ??F (35.9 ??C)   Resp: 20   Height:  (1.753 m)   Weight: 206 lb (93.441 kg)   SpO2: 95%     Constitutional: A 74 y.o.  male  who is alert, oriented, cooperative and in no apparent distress.  Head was normocephalic and atraumatic.  Male patten baldness.   EENT: Eyes were equal and reactive to light and accommodation.  Extraocular muscles intact.  No conjunctival injections.  External canals are patent.   No discharge was appreciated.  Septum was mildly deviated, mucosa was without erythema, exudates or cobblestoning.  No thrush was noted.  Swollen turbinates.   Neck: Supple without thyromegaly. No elevated JVP. Trachea was midline. No carotid bruits.    Respiratory: Symmetrical without dullness to percussion.  Breath sounds bilaterally were clear to auscultation.   No wheezes, rhonchi or rales. No intercostal retraction or use of accessory muscles. No egophony noted.   Cardiovascular: Regular without murmur, clicks, gallops or rubs.  No left or right ventricular heave.     Pulses:  Equal bilaterally.    Abdomen: Slightly rounded, soft without organomegaly. No rebound, rigidity or guarding.  Surgical (+) scars  Lymphatic: No lymphadenopathy.  Musculoskeletal:No gross muscle weakness.    Extremities:  No lower extremity edema.  Muscle size, tone and strength are normal.    Sensory function appears intact.  Deep tendon reflexes are normal.   Skin:  Warm and dry.  Good color, turgor and pigmentation. No lesions. Varicoties noted. No rash.   Neurological/Psychiatric: The patient's general behavior, level of consciousness, thought content and emotional status is normal.  Cranial nerves II-XII are intact.      DATA:     Today Spirometry compared to previous demonstrates an FVC of 3.30  which is 80 % of predicted with an FEV1 of 2.63 liters which is 87 % of predicted.  FEV1/FVC ratio is 80%.  Maximum voluntary ventilation is 128 liters per minute or 99 % of predicted.  Flow volume loop shows no signs of intrathoracic or extrathoracic obstruction. Impression. Normal.    Asthma Control Test [ACT] is a 5 part questionnaire, which has a point value system that ranges from 1- 5. This test is a validated questionnaire to help determine asthma control.  A total score of 19 or below indicates that the patients asthma may not be well controlled. If used in conjunction with PFTs, the correlation of determining asthma control is even stronger.  Today on testing the ACT was 23 out of max of 25. Impression: Poor control.     Epworth Sleepiness Scale is use this Tool to Measure Sleep Deprivation. The Epworth Sleepiness Scale was developed by researchers in [place] and is widely used by sleep professionals around the world to measure sleep deprivation.  How likely are you  to doze off or fall asleep in the following situations, in contrast to feeling just tired? This refers to your usual way of life in recent times.  Results: Total Epworth Score: 3  Interpretation: No hypersomnolence.    IMPRESSION:       BELLAMY JUDSON a 74 y.o. 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 While we have never proven the systemic vasculitis or necrotizing vasculitis part and he has no renal, neuropathic or cardiovascular involvement; the finding of chronic sinusitis, eosinophilia, pANCA (+) and asthma aid in the diagnosis.  He has no neuropathy or neuropathic mononeuritis. His p-ANCA was 1:160 but fluctuates .  However, the clinical utility of ANCA in CSS raises a controversial issue of the sensitivity and specificity of ANCA in systemic vasculitis. In CSS P-ANCA is 50%.  He should never receive leukotriene modifier, allopurinol, propylthiouracil and quinine with this history of CSS for it will cause active flairs?.     His chronic allergies, sinusitis with rhinitis and nasal  polyposis is stable after surgery. He also has a failed Nissen fundoplication with chronic gastroparesis and GERD stable based on last. As for his sleep apnea he is doing very well with his BiPAP.  With this in mind, we would like to proceed with the following;                PLAN:    We have stopped the Xolair for his allergies and elevated IgE level and he is without a decline. Sleep apnea symptoms are excellent and we see no need to adjust therapy.  We discussed the death of his son and daughter and declining health of his wife.  He has recent sinus infection and it is now clear. He will follow up in 6 months to a year for re-evaluation.  Influenzal vaccines were given.  We hope this updates you on my evaluation and clinical thinking. Thank you for allowing [place] to participate in Julian Snare care.     Sincerely,        Elmer Ramp, D.O., [place], FACP  Associate Professor of Medicine  Director, [facility]

## 2014-10-03 NOTE — Patient Instructions (Signed)
Saline Nasal Washes: Care Instructions  Your Care Instructions  Saline nasal washes help keep the nasal passages open by washing out thick or dried mucus. This simple remedy can help relieve symptoms of allergies, sinusitis, and colds. It also can make the nose feel more comfortable by keeping the mucous membranes moist. You may notice a little burning sensation in your nose the first few times you use the solution, but this usually gets better in a few days.  Follow-up care is a key part of your treatment and safety. Be sure to make and go to all appointments, and call your doctor if you are having problems. It???s also a good idea to know your test results and keep a list of the medicines you take.  How can you care for yourself at home?  ?? You can buy premixed saline solution in a squeeze bottle or other sinus rinse products at a drugstore. Read and follow the instructions on the label.  ?? You also can make your own saline solution by adding 1 teaspoon of salt and 1 teaspoon of baking soda to 2 cups of distilled water.  ?? If you use a homemade solution, pour a small amount into a clean bowl. Using a rubber bulb syringe, squeeze the syringe and place the tip in the salt water. Pull a small amount of the salt water into the syringe by relaxing your hand.  ?? Sit down with your head tilted slightly back. Do not lie down. Put the tip of the bulb syringe or the squeeze bottle a little way into one of your nostrils. Gently squirt a small amount (about 1 teaspoon) into the nostril. Repeat with the other nostril. Some sneezing and gagging are normal at first.  ?? Gently blow your nose.  ?? Wipe the syringe or bottle tip clean after each use.  ?? Repeat this 2 or 3 times a day.  ?? Use nasal washes gently if you have nosebleeds often.  When should you call for help?  Watch closely for changes in your health, and be sure to contact your doctor if:  ?? You often get nosebleeds.  ?? You have problems doing the nasal washes.   Where  can you learn more?   Go to [email or web address] and sign in to your MyChart account. Enter B784 in the Search Health Information box to learn more about ???Saline Nasal Washes: Care Instructions.???    If you do not have an account, please click on the ???Sign Up Now??? link.     ?? 2006-2015 [employer]. Care instructions adapted under license by [facility]. This care instruction is for use with your licensed healthcare professional. If you have questions about a medical condition or this instruction, always ask your healthcare professional. [employer], Inc[employer] disclaims any warranty or liability for your use of this information.  Content Version: 10.6.465758; Current as of: April 28, 2013

## 2014-10-03 NOTE — Progress Notes (Signed)
Patient to follow up with physician in 6 months.

## 2015-04-01 ENCOUNTER — Ambulatory Visit
Admit: 2015-04-01 | Discharge: 2015-04-01 | Payer: MEDICARE | Attending: Critical Care Medicine | Primary: Family Medicine

## 2015-04-01 DIAGNOSIS — J45909 Unspecified asthma, uncomplicated: Secondary | ICD-10-CM

## 2015-04-01 LAB — SPIROMETRY WITHOUT BRONCHODILATOR
FEF 25-75% PRE PRED: 104
FEF 25-75%-Pre: 2.26
FEV1 %Pred-Pre: 87
FEV1/FVC Pred: 107
FEV1/FVC: 79 %
FEV1: 2.58 L
FEV3 PRE: 3.14
FVC %Pred-Pre: 80
FVC: 3.29 L
PEF-Pre: 523.9 L/sec

## 2015-04-01 LAB — POCT NITRIC OXIDE: Parts Per Billion: 24

## 2015-04-01 NOTE — Progress Notes (Signed)
[facility]    Department of Internal Medicine  Division of Pulmonary, Critical Care & Sleep Medicine    [name], MD    We had the pleasure of seeing  Julian Castaneda in the [facility] regarding his chronic sinusitis, GERD s/p Nissen, reactive airway disease & obstructive sleep apnea.     HISTORY OF PRESENT ILLNESS:    Julian Castaneda is a 74 y.o. year old male who has a long standing history of allergies, elevated Ig E level around 600 requiring the administration of Xolair therapy. 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 Last level in 06/2010 was negative. The her recall He under when endoscopic surgery on 06/30/07 antrostomy bilateral with ethmoidectomies and left single sphenoidotomies and septoplasty. His treatment is working well, which included nasal rinses, Patanase, Astelin, and Xolair. I will  With his sleep apnea he is very compliant using the BiPAP machine nightly for at least 6 hour. His current pressure are 18/ 9 cm H20.     Since the last visit Julian Castaneda is doing well. We discussed for along time the decline in his wife's memory and health. He has had no pulmonary complaint.  He has been compliant with his CPAP and uses it nightly. His ACT is 22/25. He has no CP, SOB or leg edema. No neurologic symptoms. He has long standing GERD requiring anti-reflux surgery years ago. His last EGD which was negative by Dr. Lavone Nian he has had no repeat complaints. His antireflux surgery anastomosis was stable and clear. He was also found to have a elevated PSA and a recent biopsy done of the prostate which was benign. He will return in a 6 months.        ALLERGIES:    Allergies   Allergen Reactions   ??? Enalapril Swelling     Of lower lip   ???  Esomeprazole Hives and Swelling   ??? Nexium [Esomeprazole Magnesium Trihydrate] Swelling   ??? Nsaids Swelling   ??? Ranitidine Hives and Swelling   ??? Reglan [Metoclopramide Hcl] Other (See Comments)     States has bloating with this       PAST MEDICAL HISTORY:       Diagnosis Date   ??? Allergy    ??? Asthma      doing well per patient   ??? Chronic pansinusitis 10/03/2014   ??? CSS (Churg-Strauss syndrome) (HCC)      no current issues   ??? Diabetes mellitus (HCC)      patient denies   ??? Elevated PSA      negative biospy   ??? GERD (gastroesophageal reflux disease)    ??? Hyperlipidemia    ??? OSA treated with BiPAP 03/27/2014   ??? Sinusitis    ??? Sleep apnea      CHM 18/9        MEDICATIONS:   Current Outpatient Prescriptions   Medication Sig Dispense Refill   ??? amLODIPine (NORVASC) 2.5 MG tablet Take 2.5 mg by mouth nightly     ??? hydrochlorothiazide (HYDRODIURIL) 25 MG tablet Take 25 mg by mouth daily.       ??? atorvastatin (LIPITOR) 80 MG tablet Take 80 mg by mouth daily.     ??? XOPENEX HFA 45 MCG/ACT inhaler Inhale  1 puff into the lungs every 8 hours as needed for Wheezing. 1 Inhaler 12     No current facility-administered medications for this visit.        SOCIAL AND OCCUPATIONAL HEALTH:  He is married with four children. One son died from HF, daughter died from CAD all yound. Wife has dementia (advanced). The patient is a never smoked  There is not history of TB or TB exposure.  No Etoh Abuse.  There is not asbestos or silica dust exposure.  The patient reports no coal, foundry, quarry or [employer] exposure.  Travel history reveals no trips.  Denies history of recreational or IV drug use. Denies hot tub exposure. The patient has no pets. Hobbies include watching soap opera.    Social History   Substance Use Topics   ??? Smoking status: Never Smoker   ??? Smokeless tobacco: Never Used   ??? Alcohol use No       SURGICAL HISTORY:   Past Surgical History   Procedure Laterality Date   ??? Gastric fundoplication  2005   ??? Septoplasty     ???  Hernia repair     ??? Nasal polyp surgery     ??? Prostate biopsy  2011     negative pathology   ??? Upper gastrointestinal endoscopy  12/2009     with biopsy, pathology negative   ??? Tumor removal  11/2010     fatty tumor removed Dr Lavone Nian   ??? Endoscopy, colon, diagnostic  01/22/2014       FAMILY HISTORY:   Family History   Problem Relation Age of Onset   ??? High Blood Pressure Father    ??? High Cholesterol Father    ??? Asthma Sister    ??? Diabetes Sister        REVIEW OF SYSTEMS:   Constitutional: General health good . No weight changes.  No fevers, fatigue or weakness.   Head: Patient denies any history of trauma, convulsive disorder or syncope.    Skin:  Patient denies history of changes in pigmentation, eruptions or pruritus.  No easy bruising or bleeding.  EENT: Patient denies any history of color blindness, photophobia, diplopia, inflammation, cataracts or glaucoma.   Patient denies history of deafness, tinnitus, pain, discharge or recurrent infections.    Patient has a long history of rhinitis, chronic nasal discharge, drainage, & nasal polyps; all requiring surgery. Patient denies history of soreness of mouth or tongue.   Patient denies history of hoarseness, voice changes, sore throats or tonsillitis.    Lymphatic:  Patient denies history of enlargement, inflammation, pain or suppuration.    Cardiovascular:  Patient denies history of palpations, heart murmur, irregular rhythm, chest pain, exertional dyspnea, cyanosis, ascites, rheumatic fever, cold extremities or edema.  Respiration:  Patient denies wheezing, dyspnea, nocturnal dyspnea, cough, hemoptysis, pleurisy, TB or asthma.   Known OSA on [place] 19/ 8 cm H20. 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   Gastrointestinal: Patient denies changes in appetite. No dysphagia, odynophagia or abdominal pain.  No hematochezia, melena, bowel habit changes or hemorrhoids.    No jaundice. GERD Gastic surgery in past with poor results.   Genitourinary:   Patient denies  dysuria, frequency, urgency or incontinence.  Complains of difficulty in starting or stopping urinary stream.    Reports history of stones which is followed by [facility].  He has benign prostatic hypertrophy but no prostate cancer. Recent Biopsy.   Musculoskeletal: The patient denies history of arthritis  or joint pains.  No loss of strength or dislocation.  Breasts:  No history of masses, lumps, pain, nipple changes or painful nipples.  Neurological: Patient denies vertigo, syncope.  No twitching or memory.   Psychological: Patient denies moodiness, depression or anxiety.  No obsessions or hallucinations.    Endocrine:  No history of goiter, exophthalmos or dryness of skin.  No polyuria or polyphagia.  No diabetes.   Hematopoietic:  No history of bleeding disorders or easy bruising.  Rheumatic:  No polyarthritis or inflammatory joint disease. History of positive P-ANCA    PHYSICAL EXAMINATION:  Vitals:    04/01/15 1404   BP: 160/76   Site: Left Arm   Position: Sitting   Cuff Size: Medium Adult   Pulse: 62   Temp: 98 ??F (36.7 ??C)   TempSrc: Oral   SpO2: 94%   Weight: 207 lb (93.9 kg)   Height: 5\' 9"  (1.753 m)     Constitutional: A 74 y.o.  male  who is alert, oriented, cooperative and in no apparent distress.  Head was normocephalic and atraumatic.  Male patten baldness.   EENT: Eyes were equal and reactive to light and accommodation.  Extraocular muscles intact.  No conjunctival injections.  External canals are patent.   No discharge was appreciated.  Septum was mildly deviated, mucosa was without erythema, exudates or cobblestoning.  No thrush was noted.  Swollen turbinates.   Neck: Supple without thyromegaly. No elevated JVP. Trachea was midline. No carotid bruits.    Respiratory: Symmetrical without dullness to percussion.  Breath sounds bilaterally were clear to auscultation.   No wheezes, rhonchi or rales. No intercostal retraction or use of accessory muscles. No egophony noted.   Cardiovascular: Regular without  murmur, clicks, gallops or rubs.  No left or right ventricular heave.    Pulses:  Equal bilaterally.    Abdomen: Slightly rounded, soft without organomegaly. No rebound, rigidity or guarding.  Surgical (+) scars  Lymphatic: No lymphadenopathy.  Musculoskeletal:No gross muscle weakness.    Extremities:  No lower extremity edema.  Muscle size, tone and strength are normal.    Sensory function appears intact.  Deep tendon reflexes are normal.   Skin:  Warm and dry.  Good color, turgor and pigmentation. No lesions. Varicoties noted. No rash.   Neurological/Psychiatric: The patient's general behavior, level of consciousness, thought content and emotional status is normal.  Cranial nerves II-XII are intact.      DATA:     Today Spirometry compared to previous demonstrates an FVC of 3.30  which is 80 % of predicted with an FEV1 of 2.63 liters which is 87 % of predicted.  FEV1/FVC ratio is 80%.  Maximum voluntary ventilation is 128 liters per minute or 99 % of predicted.  Flow volume loop shows no signs of intrathoracic or extrathoracic obstruction. Impression. Normal.    Asthma Control Test [ACT] is a 5 part questionnaire, which has a point value system that ranges from 1- 5. This test is a validated questionnaire to help determine asthma control.  A total score of 19 or below indicates that the patients asthma may not be well controlled. If used in conjunction with PFTs, the correlation of determining asthma control is even stronger.  Today on testing the ACT was 23 out of max of 25. Impression: Poor control.     Epworth Sleepiness Scale is use this Tool to Measure Sleep Deprivation. The Epworth Sleepiness Scale was developed by researchers in [place] and is widely used  by sleep professionals around the world to measure sleep deprivation.  How likely are you to doze off or fall asleep in the following situations, in contrast to feeling just tired? This refers to your usual way of life in recent times.  Results: Total  Epworth Score: 3  Interpretation: No hypersomnolence.    IMPRESSION:      Julian Castaneda a 74 y.o. 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 While we have never proven the systemic vasculitis or necrotizing vasculitis part and he has no renal, neuropathic or cardiovascular involvement; the finding of chronic sinusitis, eosinophilia, pANCA (+) and asthma aid in the diagnosis.  He has no neuropathy or neuropathic mononeuritis. His p-ANCA was 1:160 but fluctuates .  However, the clinical utility of ANCA in CSS raises a controversial issue of the sensitivity and specificity of ANCA in systemic vasculitis. In CSS P-ANCA is 50%.  He should never receive leukotriene modifier, allopurinol, propylthiouracil and quinine with this history of CSS for it will cause active flairs?.  his chronic allergies, sinusitis with rhinitis and nasal  polyposis is stable after surgery. He also has a failed Nissen fundoplication with chronic gastroparesis and GERD stable based on last. As for his sleep apnea he is doing very well with his BiPAP.  With this in mind, we would like to proceed with the following;                PLAN:    It is always good to see Julian Castaneda. He is still walking miles in the morning. Most of the time is spent caring from his wife whom has dementia.  We have stopped the Xolair for his allergies and elevated IgE level and he is without a decline. Sleep apnea symptoms are excellent and we see no need to adjust therapy.  We discussed the death of his son and daughter and declining health of his wife (dementia).  He has recent sinus infection and it is now clear. He will follow up in 6 months to a year for re-evaluation.  Influenzal vaccines was discussed given.  We hope this updates you on my evaluation and clinical thinking. Thank you for allowing [place] to participate in [name] care.      Sincerely,    Elmer Ramp, D.O., MPH, [employer], FACP  Associate Professor of Medicine  Director, [facility]

## 2015-04-01 NOTE — Patient Instructions (Addendum)
Chronic Sinusitis: Care Instructions  Your Care Instructions     Sinusitis is an infection of the lining of the sinus cavities in your head. It causes pain and pressure in your head and face.  Sinusitis can be short-term (acute) or long-term (chronic). Chronic sinusitis lasts 12 weeks or longer. It is often caused by a bacterial or fungal infection. Other things, such as allergies, may also be involved.  Chronic sinusitis may be hard to treat. It can lead to permanent changes in the mucous membranes that line the sinuses. It may make future sinus infections more likely.  The infection may take some time to treat. Antibiotics are usually used if the infection is caused by bacteria. You may also need to use a corticosteroid nasal spray. If the infection is not cured after you try two or more different antibiotics, you may want to talk with your doctor about surgery or allergy testing.  If the sinusitis is caused by a fungal infection, you may need to take antifungals or other medicines. You may also need surgery.  Follow-up care is a key part of your treatment and safety. Be sure to make and go to all appointments, and call your doctor if you are having problems. It's also a good idea to know your test results and keep a list of the medicines you take.  How can you care for yourself at home?  Medicines  ?? Be safe with medicines. Take your medicines exactly as prescribed. Call your doctor if you think you are having a problem with your medicine. You will get more details on the specific medicines your doctor prescribes.  ?? Take your antibiotics as directed. Do not stop taking them just because you feel better. You need to take the full course of antibiotics.  ?? Your doctor may recommend a corticosteroid nasal spray, wash, drops, or pills. Take this medicine exactly as prescribed.  At home  ?? Breathe warm, moist air. You can use a steamy shower, a hot bath, or a sink filled with hot water. Avoid cold, dry air. Using a  humidifier in your home may help. Follow the instructions for cleaning the machine.  ?? Use saline (saltwater) nasal washes every day. This helps keep your nasal passages open. It also can wash out mucus and bacteria.  ?? You can buy saline nose drops at a grocery store or drugstore.  ?? You can make your own at home. Add 1 teaspoon of salt and 1 teaspoon of baking soda to 2 cups of distilled water. If you make your own, fill a bulb syringe with the solution. Then insert the tip into your nostril and squeeze gently. Blow your nose.  ?? Put a warm, wet towel or a warm gel pack on your face 3 or 4 times a day. Leave it on 5 to 10 minutes each time.  ?? Do not smoke or breathe secondhand smoke. Smoking can make sinusitis worse. If you need help quitting, talk to your doctor about stop-smoking programs and medicines. These can increase your chances of quitting for good.  When should you call for help?  Call your doctor now or seek immediate medical care if:  ?? You have new or worse swelling or redness in face or around eyes.  Watch closely for changes in your health, and be sure to contact your doctor if:  ?? You have a new or higher fever.  ?? You have new or worse facial pain.  ?? The mucus from your   nose becomes thicker (like pus) or has new blood in it.  ?? You do not get better as expected.   Where can you learn more?   Go to [email or web address] and sign in to your MyChart account. Enter F824 in the Search Health Information box to learn more about ???Chronic Sinusitis: Care Instructions.???    If you do not have an account, please click on the ???Sign Up Now??? link.   ?? 2006-2016 [employer]. Care instructions adapted under license by [facility]. This care instruction is for use with your licensed healthcare professional. If you have questions about a medical condition or this instruction, always ask your healthcare professional. Healthwise, Incorporated disclaims any warranty or liability for  your use of this information.  Content Version: 10.8.513193; Current as of: May 04, 2014

## 2015-04-01 NOTE — Progress Notes (Signed)
Patient to follow up with physician in 6 months.

## 2015-04-04 ENCOUNTER — Encounter: Attending: Critical Care Medicine | Primary: Family Medicine

## 2015-06-21 NOTE — ED Provider Notes (Signed)
[facility]    HPI:  06/22/15,   Time: 1:05 AM         Julian Castaneda is a 75 y.o. male presenting to the ED for having shaking chills at home earlier this evening calling his urologist who performed a prostate biopsy at [facility] earlier this morning and was informed that he should come to the emergency department for further evaluation. At the time the patient had the chills he had some sweating he was not able to take his temperature is he had no thermometer but is sure that he had a fever. He has some upper abdominal discomfort feeling like reflux symptoms like something for that as well. At this time he is not having shaking chills or fever. He has taken Tylenol for his discomfort. He is not having any dysuria or hematuria.      ROS:   Pertinent positives and negatives are stated within HPI, all other systems reviewed and are negative.  --------------------------------------------- PAST HISTORY ---------------------------------------------  Past Medical History:  has a past medical history of Allergy; Asthma; Chronic pansinusitis; CSS (Churg-Strauss syndrome) (HCC); Diabetes mellitus (HCC); Elevated PSA; GERD (gastroesophageal reflux disease); Hyperlipidemia; OSA treated with BiPAP; Sinusitis; and Sleep apnea.    Past Surgical History:  has a past surgical history that includes Gastric fundoplication (2005); Septoplasty; hernia repair; Nasal polyp surgery; ostate biopsy (2011); Upper gastrointestinal endoscopy (12/2009); tumor removal (11/2010); and Endoscopy, colon, diagnostic (01/22/2014).    Social History:  reports that he has never smoked. He has never used smokeless tobacco. He reports that he does not drink alcohol or use illicit drugs.    Family History: family history includes Asthma in his sister; Diabetes in his sister; High Blood Pressure in his father; High Cholesterol in his father.     The patient's home medications have been reviewed.    Allergies: Enalapril; Esomeprazole; Nexium  [esomeprazole magnesium trihydrate]; Nsaids; Ranitidine; and Reglan [metoclopramide hcl]    -------------------------------------------------- RESULTS -------------------------------------------------  All laboratory and radiology results have been personally reviewed by myself   LABS:  Results for orders placed or performed during the hospital encounter of 06/22/15   CBC Auto Differential   Result Value Ref Range    WBC 8.1 4.5 - 11.5 E9/L    RBC 4.94 3.80 - 5.80 E12/L    Hemoglobin 14.1 12.5 - 16.5 g/dL    Hematocrit 16.1 09.6 - 54.0 %    MCV 87.9 80.0 - 99.9 fL    MCH 28.5 26.0 - 35.0 pg    MCHC 32.5 32.0 - 34.5 %    RDW 13.8 11.5 - 15.0 fL    Platelets 117 (L) 130 - 450 E9/L    MPV 11.1 7.0 - 12.0 fL   Comprehensive Metabolic Panel   Result Value Ref Range    Sodium 139 132 - 146 mmol/L    Potassium 4.5 3.5 - 5.0 mmol/L    Chloride 103 98 - 107 mmol/L    CO2 25 22 - 29 mmol/L    Anion Gap 11 7 - 16 mmol/L    Glucose 105 74 - 109 mg/dL    BUN 21 8 - 23 mg/dL    CREATININE 1.1 0.7 - 1.2 mg/dL    GFR Non-African American >60 >=60 mL/min/1.73    GFR African American >60     Calcium 9.0 8.6 - 10.2 mg/dL    Total Protein 6.5 6.4 - 8.3 g/dL    Alb 4.0 3.5 - 5.2 g/dL  Total Bilirubin 2.2 (H) 0.0 - 1.2 mg/dL    Alkaline Phosphatase 85 40 - 129 U/L    ALT 21 0 - 40 U/L    AST 27 0 - 39 U/L   Lipase   Result Value Ref Range    Lipase 22 13 - 60 U/L   Urinalysis   Result Value Ref Range    Color, UA Yellow Straw/Yellow    Clarity, UA Clear Clear    Glucose, Ur Negative Negative mg/dL    Bilirubin Urine Negative Negative    Ketones, Urine Negative Negative mg/dL    Specific Gravity, UA 1.025 1.005 - 1.030    Blood, Urine MODERATE (A) Negative    pH, UA 5.0 5.0 - 9.0    Protein, UA Negative Negative mg/dL    Urobilinogen, Urine 0.2 <2.0 E.U./dL    Nitrite, Urine Negative Negative    Leukocyte Esterase, Urine Negative Negative   Lactic Acid, Plasma   Result Value Ref Range    Lactic Acid 2.2 0.5 - 2.2 mmol/L   Microscopic  Urinalysis   Result Value Ref Range    WBC, UA NONE 0 - 5 /HPF    RBC, UA 10-20 (A) 0 - 2 /HPF    Bacteria, UA NONE /HPF       RADIOLOGY:  Interpreted by Radiologist.  No orders to display       ------------------------- NURSING NOTES AND VITALS REVIEWED ---------------------------   The nursing notes within the ED encounter and vital signs as below have been reviewed.     Vitals:    06/22/15 0047 06/22/15 0134 06/22/15 0211   BP: (!) 165/73  (!) 145/64   Pulse: 96  83   Resp: 18  16   Temp: 98.4 F (36.9 C)     TempSrc: Oral     SpO2: 94% 95% 96%   Weight: 192 lb (87.1 kg)     Height: 5\' 8"  (1.727 m)             ---------------------------------------------------PHYSICAL EXAM--------------------------------------      Constitutional/General: 75 year old pleasant engaging male moving freely in the emergency department area and is Alert and oriented x3, well appearing, non toxic in NAD  Head: NC/AT  Eyes: PERRL  Mouth: Oropharynx clear, handling secretions, no trismus  Neck: Supple, full ROM without pain, no meningeal signs  Pulmonary: Lungs clear to auscultation bilaterally, no wheezes, rales, or rhonchi. Not in respiratory distress  Cardiovascular:  Regular rate and rhythm, no murmurs, gallops, or rubs. 2+ distal pulses, no peripheral edema  Abdomen: Obese Soft, non tender, non distended, negative HSM, normoactive bowel sounds throughout, negative CVAT, no peritoneal signs no scrotal edema or tenderness, no penile discharge or bleeding.  Extremities: Moves all extremities x 4. Warm and well perfused  Skin: warm and dry without rash  Neurologic: GCS 15, Face is symmetric (VII), EOMs are intact (III, IV, VI), pupils are equal and reactive (II, III), tongue is midline (XII). The patient is walking in a smooth balanced and coordinated fashion w/o bumping or walking into anything (vision), talking w/ appropriate content and fluency, is fully attentive, and provided a spontaneous coherent history.   Psych: Normal  Affect      ------------------------------------------PROGRESS NOTES -------------------------------------------    MDM  Lab evaluation unremarkable for infectious processes, hematuria consistent with prostate biopsy , F/U with PCP    ED COURSE MEDICATIONS:                Medications   ondansetron (  ZOFRAN) injection 4 mg (4 mg Intravenous Given 06/22/15 0133)   0.9 % sodium chloride bolus (0 mLs Intravenous Stopped 06/22/15 0151)       RE-Evaluation(s):    Time: 0225   Patient's symptoms are improving. He states he feels much better and is ready to go home.   Repeat physical examination has significantly improved.        COUNSELING:   I have spoken with the son and patient and discussed today's results, in addition to providing specific details for the plan of care and counseling regarding the diagnosis and prognosis.     --------------------------------------- IMPRESSION & DISPOSITION --------------------------------     IMPRESSION(s):  1. Chills (without fever)    2. H/O prostate biopsy          DISPOSITION:  Disposition: Discharge to home.  Patient condition is stable.                 Baruch Gouty, CNP  06/22/15 [id number]

## 2015-06-22 ENCOUNTER — Inpatient Hospital Stay: Admit: 2015-06-22 | Discharge: 2015-06-22 | Disposition: A | Discharge status: Home / Self Care

## 2015-06-22 ENCOUNTER — Emergency Department: Admit: 2015-06-23 | Primary: Family Medicine

## 2015-06-22 DIAGNOSIS — R6883 Chills (without fever): Secondary | ICD-10-CM

## 2015-06-22 LAB — COMPREHENSIVE METABOLIC PANEL
ALT: 21 U/L (ref 0–40)
AST: 27 U/L (ref 0–39)
Albumin: 4 g/dL (ref 3.5–5.2)
Alkaline Phosphatase: 85 U/L (ref 40–129)
Anion Gap: 11 mmol/L (ref 7–16)
BUN: 21 mg/dL (ref 8–23)
CO2: 25 mmol/L (ref 22–29)
Calcium: 9 mg/dL (ref 8.6–10.2)
Chloride: 103 mmol/L (ref 98–107)
Creatinine: 1.1 mg/dL (ref 0.7–1.2)
GFR African American: >60
GFR Non-African American: >60 mL/min/{1.73_m2} (ref >=60)
Glucose: 105 mg/dL (ref 74–109)
Potassium: 4.5 mmol/L (ref 3.5–5.0)
Sodium: 139 mmol/L (ref 132–146)
Total Bilirubin: 2.2 mg/dL — ABNORMAL HIGH (ref 0.0–1.2)
Total Protein: 6.5 g/dL (ref 6.4–8.3)

## 2015-06-22 LAB — CBC WITH AUTO DIFFERENTIAL
Basophils %: 0.4 % (ref 0.0–2.0)
Basophils Absolute: 0.03 E9/L (ref 0.00–0.20)
Eosinophils %: 0.5 % (ref 0.0–6.0)
Eosinophils Absolute: 0.04 E9/L — ABNORMAL LOW (ref 0.05–0.50)
Hematocrit: 43.4 % (ref 37.0–54.0)
Hemoglobin: 14.1 g/dL (ref 12.5–16.5)
Immature Granulocytes #: 0.03 E9/L
Immature Granulocytes %: 0.4 % (ref 0.0–5.0)
Lymphocytes %: 5.6 % — ABNORMAL LOW (ref 20.0–42.0)
Lymphocytes Absolute: 0.45 E9/L — ABNORMAL LOW (ref 1.50–4.00)
MCH: 28.5 pg (ref 26.0–35.0)
MCHC: 32.5 % (ref 32.0–34.5)
MCV: 87.9 fL (ref 80.0–99.9)
MPV: 11.1 fL (ref 7.0–12.0)
Monocytes %: 3.2 % (ref 2.0–12.0)
Monocytes Absolute: 0.26 E9/L (ref 0.10–0.95)
Neutrophils %: 89.9 % — ABNORMAL HIGH (ref 43.0–80.0)
Neutrophils Absolute: 7.26 E9/L (ref 1.80–7.30)
Platelets: 117 E9/L — ABNORMAL LOW (ref 130–450)
RBC: 4.94 E12/L (ref 3.80–5.80)
RDW: 13.8 fL (ref 11.5–15.0)
WBC: 8.1 E9/L (ref 4.5–11.5)

## 2015-06-22 LAB — URINALYSIS
Bilirubin Urine: NEGATIVE
Glucose, Ur: NEGATIVE mg/dL
Ketones, Urine: NEGATIVE mg/dL
Leukocyte Esterase, Urine: NEGATIVE
Nitrite, Urine: NEGATIVE
Protein, UA: NEGATIVE mg/dL
Specific Gravity, UA: 1.025 (ref 1.005–1.030)
Urobilinogen, Urine: 0.2 E.U./dL (ref <2.0)
pH, UA: 5 (ref 5.0–9.0)

## 2015-06-22 LAB — LACTIC ACID: Lactic Acid: 2.2 mmol/L (ref 0.5–2.2)

## 2015-06-22 LAB — MICROSCOPIC URINALYSIS

## 2015-06-22 LAB — LIPASE: Lipase: 22 U/L (ref 13–60)

## 2015-06-22 MED ORDER — ONDANSETRON HCL 4 MG/2ML IJ SOLN
4 MG/2ML | INTRAMUSCULAR | Status: AC
Start: 2015-06-22 — End: 2015-06-22
  Administered 2015-06-22: 07:00:00 4 via INTRAVENOUS

## 2015-06-22 MED ORDER — ONDANSETRON HCL 4 MG/2ML IJ SOLN
4 MG/2ML | Freq: Once | INTRAMUSCULAR | Status: AC
Start: 2015-06-22 — End: 2015-06-22

## 2015-06-22 MED ORDER — ONDANSETRON HCL 4 MG PO TABS
4 MG | ORAL_TABLET | Freq: Three times a day (TID) | ORAL | 0 refills | Status: AC | PRN
Start: 2015-06-22 — End: 2015-06-27

## 2015-06-22 MED ORDER — SODIUM CHLORIDE 0.9 % IV BOLUS
0.9 % | Freq: Once | INTRAVENOUS | Status: AC
Start: 2015-06-22 — End: 2015-06-22
  Administered 2015-06-22: 07:00:00 1000 mL via INTRAVENOUS

## 2015-06-22 MED FILL — ONDANSETRON HCL 4 MG/2ML IJ SOLN: 4 MG/2ML | INTRAMUSCULAR | Qty: 2

## 2015-06-22 NOTE — ED Provider Notes (Signed)
??  Department of Emergency Medicine   ED  Provider Note  Admit Date/RoomTime: 06/23/2015 12:56 AM  ED Room: 0446/0446-A      History of Present Illness:  06/22/15, Time: 11:24 PM         Julian Castaneda is a 75 y.o. male presenting to the ED for post-op problem, beginning yesterday.  The complaint has been persistent, mild in severity, and worsened by nothing. Pt recently had a prostate biopsy and is experiencing potential post-op problems including fever, nausea, and diaphoresis. Pt was seen in the ED yesterday and was discharged after lab results and imaging appeared to be normal. Pt decided to check into the ED due to an onset of fever. Pt's fever was 100.4 in triage. Pt's urologist is Dr. Loyal Gambler.    Review of Systems:   Pertinent positives and negatives are stated within HPI, all other systems reviewed and are negative.    --------------------------------------------- PAST HISTORY ---------------------------------------------  Past Medical History:  has a past medical history of Allergy; Asthma; Chronic pansinusitis; CSS (Churg-Strauss syndrome) (HCC); Diabetes mellitus (HCC); Elevated PSA; GERD (gastroesophageal reflux disease); Hyperlipidemia; OSA treated with BiPAP; Sinusitis; and Sleep apnea.    Past Surgical History:  has a past surgical history that includes Gastric fundoplication (2005); Septoplasty; hernia repair; Nasal polyp surgery; ostate biopsy (2011); Upper gastrointestinal endoscopy (12/2009); tumor removal (11/2010); and Endoscopy, colon, diagnostic (01/22/2014).    Social History:  reports that he has never smoked. He has never used smokeless tobacco. He reports that he does not drink alcohol or use illicit drugs.    Family History: family history includes Asthma in his sister; Diabetes in his sister; High Blood Pressure in his father; High Cholesterol in his father.     The patient???s home medications have been reviewed.    Allergies: Enalapril; Esomeprazole; Nexium [esomeprazole magnesium  trihydrate]; Nsaids; Ranitidine; and Reglan [metoclopramide hcl]      ---------------------------------------------------PHYSICAL EXAM--------------------------------------    Constitutional/General: Alert and oriented x3, well appearing, non toxic in NAD  Head: Normocephalic and atraumatic  Eyes: PERRL, EOMI, conjunctiva normal, sclera non icteric  Mouth: Oropharynx clear  Neck: Supple  Respiratory: Lungs clear to auscultation bilaterally, no wheezes, rales, or rhonchi. Not in respiratory distress  Cardiovascular:  Regular rate. Regular rhythm. No murmurs, gallops, or rubs. 2+ distal pulses  Chest: No chest wall tenderness  GI:  Abdomen Soft, Non tender, Non distended.  +BS.   No rebound, guarding, or rigidity. No pulsatile masses.  Musculoskeletal: Moves all extremities x 4. Warm and well perfused, no clubbing, cyanosis, or edema.  Integument: skin warm and dry. No rashes.   Neurologic: GCS 15, no focal deficits, symmetric strength 5/5 in the upper and lower extremities bilaterally  Psychiatric: Normal Affect    -------------------------------------------------- RESULTS -------------------------------------------------  I have personally reviewed all laboratory and imaging results for this patient. Results are listed below.     LABS:  Results for orders placed or performed during the hospital encounter of 06/23/15   CBC Auto Differential   Result Value Ref Range    WBC 7.6 4.5 - 11.5 E9/L    RBC 4.59 3.80 - 5.80 E12/L    Hemoglobin 12.9 12.5 - 16.5 g/dL    Hematocrit 16.1 09.6 - 54.0 %    MCV 86.7 80.0 - 99.9 fL    MCH 28.1 26.0 - 35.0 pg    MCHC 32.4 32.0 - 34.5 %    RDW 13.9 11.5 - 15.0 fL    Platelets 91 (L)  130 - 450 E9/L    MPV 11.8 7.0 - 12.0 fL    Neutrophils % 95.7 (H) 43.0 - 80.0 %    Lymphocytes Relative 1.7 (L) 20.0 - 42.0 %    Monocytes % 0.9 (L) 2.0 - 12.0 %    Eosinophils Relative Percent 0.0 0.0 - 6.0 %    Basophils % 0.0 0.0 - 2.0 %    Neutrophils # 7.30 1.80 - 7.30 E9/L    Lymphocytes # 0.23 (L)  1.50 - 4.00 E9/L    Monocytes # 0.08 (L) 0.10 - 0.95 E9/L    Eosinophils # 0.00 (L) 0.05 - 0.50 E9/L    Basophils # 0.00 0.00 - 0.20 E9/L    Atypical Lymphocytes Relative 1.7 0.0 - 4.0 %    nRBC 0.0 /100 WBC    Anisocytosis 1+     Poikilocytes 1+     Burr Cells 1+     Ovalocytes 1+    Comprehensive Metabolic Panel   Result Value Ref Range    Sodium 135 132 - 146 mmol/L    Potassium 3.6 3.5 - 5.0 mmol/L    Chloride 99 98 - 107 mmol/L    CO2 23 22 - 29 mmol/L    Anion Gap 13 7 - 16 mmol/L    Glucose 101 74 - 109 mg/dL    BUN 20 8 - 23 mg/dL    CREATININE 1.2 0.7 - 1.2 mg/dL    GFR Non-African American 59 >=60 mL/min/1.73    GFR African American >60     Calcium 8.8 8.6 - 10.2 mg/dL    Total Protein 6.0 (L) 6.4 - 8.3 g/dL    Alb 3.6 3.5 - 5.2 g/dL    Total Bilirubin 4.5 (H) 0.0 - 1.2 mg/dL    Alkaline Phosphatase 103 40 - 129 U/L    ALT 29 0 - 40 U/L    AST 36 0 - 39 U/L   Urinalysis   Result Value Ref Range    Color, UA BLOODY Straw/Yellow    Clarity, UA CLOUDY (A) Clear    Glucose, Ur Negative Negative mg/dL    Bilirubin Urine SMALL (A) Negative    Ketones, Urine TRACE (A) Negative mg/dL    Specific Gravity, UA 1.025 1.005 - 1.030    Blood, Urine LARGE (A) Negative    pH, UA 5.0 5.0 - 9.0    Protein, UA 100 (A) Negative mg/dL    Urobilinogen, Urine 1.0 <2.0 E.U./dL    Nitrite, Urine POSITIVE (A) Negative    Leukocyte Esterase, Urine SMALL (A) Negative   Lactic Acid, Plasma   Result Value Ref Range    Lactic Acid 2.3 (H) 0.5 - 2.2 mmol/L   Microscopic Urinalysis   Result Value Ref Range    WBC, UA 5-10 0 - 5 /HPF    RBC, UA PACKED 0 - 2 /HPF    Epi Cells FEW /HPF    Bacteria, UA MODERATE (A) /HPF   Platelet Confirmation   Result Value Ref Range    Platelet Confirmation CONFIRMED        RADIOLOGY:  Interpreted by Radiologist.  XR Chest Portable   Final Result   No radiographic evidence of acute cardiopulmonary disease.             EKG:  This EKG is signed and interpreted by the EP.    Time: 23:08  Rate: 77  Rhythm:  Sinus  Interpretation: Voltage criteria for left ventricular hypertrophy  Comparison:  none    ------------------------- NURSING NOTES AND VITALS REVIEWED ---------------------------   The nursing notes within the ED encounter and vital signs as below have been reviewed by myself.  Visit Vitals   ??? BP (!) 106/55   ??? Pulse 72   ??? Temp 98.1 ??F (36.7 ??C) (Oral)   ??? Resp 20   ??? Ht 5\' 9"  (1.753 m)   ??? Wt 204 lb 12.8 oz (92.9 kg)   ??? SpO2 93%   ??? BMI 30.24 kg/m2     Oxygen Saturation Interpretation: Abnormal - but at baseline    The patient???s available past medical records and past encounters were reviewed.      ------------------------------ ED COURSE/MEDICAL DECISION MAKING----------------------  Medications   0.9 % sodium chloride infusion (not administered)   piperacillin-tazobactam (ZOSYN) 3.375 g in dextrose 5 % 50 mL IVPB (mini-bag) (0 g Intravenous Stopped 06/23/15 0014)   0.9 % sodium chloride infusion ( Intravenous Stopped 06/23/15 0053)   acetaminophen (TYLENOL) tablet 650 mg (650 mg Oral Given 06/22/15 2328)   ondansetron (ZOFRAN) injection 4 mg (4 mg Intravenous Given 06/22/15 2350)   ondansetron (ZOFRAN) 4 MG/2ML injection (  Given by Other 06/23/15 0130)       Medical Decision Making:    Have ordered blood and urine cultures, CBC, CMP, urinalysis, lactic acid/plasma. Reviewed and discussed all lab and imaging results with the pt and pt's family.     This patient's ED course included: a personal history and physicial examination, re-evaluation prior to disposition, IV medications, cardiac monitoring and continuous pulse oximetry    This patient has remained hemodynamically stable during their ED course.    Re-Evaluations:           Re-evaluation.  Patient???s symptoms are improving    Consultations:             Spoke with Dr. Loyal GamblerStyn and ordered a foley catheter to be used. Pt likely to be admitted.    Counseling:   The emergency provider has spoken with the patient and discussed today???s results, in addition to providing  specific details for the plan of care and counseling regarding the diagnosis and prognosis.  Questions are answered at this time and they are agreeable with the plan.     --------------------------------- IMPRESSION AND DISPOSITION ---------------------------------    IMPRESSION  1. Fever, unspecified fever cause        DISPOSITION  Disposition: Admit to med/surg floor.  Patient condition is stable    06/22/15, 11:24 PM.    This note is prepared by Awilda Billyler Ford, acting as Scribe for Herby AbrahamJoseph A Shilpa Bushee, DO.    Herby AbrahamJoseph A Governor Matos, DO:  The scribe's documentation has been prepared under my direction and personally reviewed by me in its entirety.  I confirm that the note above accurately reflects all work, treatment, procedures, and medical decision making performed by me.          Herby AbrahamJoseph A Lirio Bach, DO  06/23/15 0559

## 2015-06-22 NOTE — ED Notes (Signed)
Bed: 24  Expected date:   Expected time:   Means of arrival:   Comments:  EMS fever     Benancio Deedsrik J [facility]White  06/22/15 2248

## 2015-06-23 ENCOUNTER — Inpatient Hospital Stay
Admission: EM | Admit: 2015-06-24 | Discharge: 2015-06-28 | Disposition: A | Discharge status: Home / Self Care | Source: Home / Self Care | Admitting: Urology

## 2015-06-23 LAB — MICROSCOPIC URINALYSIS

## 2015-06-23 LAB — URINALYSIS
Glucose, Ur: NEGATIVE mg/dL
Nitrite, Urine: POSITIVE — AB
Protein, UA: 100 mg/dL — AB
Specific Gravity, UA: 1.025 (ref 1.005–1.030)
Urobilinogen, Urine: 1 E.U./dL (ref <2.0)
pH, UA: 5 (ref 5.0–9.0)

## 2015-06-23 LAB — CBC WITH AUTO DIFFERENTIAL
Atypical Lymphocytes Relative: 1.7 % (ref 0.0–4.0)
Basophils %: 0 % (ref 0.0–2.0)
Basophils Absolute: 0 E9/L (ref 0.00–0.20)
Eosinophils %: 0 % (ref 0.0–6.0)
Eosinophils Absolute: 0 E9/L — ABNORMAL LOW (ref 0.05–0.50)
Hematocrit: 39.8 % (ref 37.0–54.0)
Hemoglobin: 12.9 g/dL (ref 12.5–16.5)
Lymphocytes %: 1.7 % — ABNORMAL LOW (ref 20.0–42.0)
Lymphocytes Absolute: 0.23 E9/L — ABNORMAL LOW (ref 1.50–4.00)
MCH: 28.1 pg (ref 26.0–35.0)
MCHC: 32.4 % (ref 32.0–34.5)
MCV: 86.7 fL (ref 80.0–99.9)
MPV: 11.8 fL (ref 7.0–12.0)
Monocytes %: 0.9 % — ABNORMAL LOW (ref 2.0–12.0)
Monocytes Absolute: 0.08 E9/L — ABNORMAL LOW (ref 0.10–0.95)
Neutrophils %: 95.7 % — ABNORMAL HIGH (ref 43.0–80.0)
Neutrophils Absolute: 7.3 E9/L (ref 1.80–7.30)
Platelets: 91 E9/L — ABNORMAL LOW (ref 130–450)
RBC: 4.59 E12/L (ref 3.80–5.80)
RDW: 13.9 fL (ref 11.5–15.0)
WBC: 7.6 E9/L (ref 4.5–11.5)
nRBC: 0 /100 WBC

## 2015-06-23 LAB — COMPREHENSIVE METABOLIC PANEL
ALT: 29 U/L (ref 0–40)
AST: 36 U/L (ref 0–39)
Albumin: 3.6 g/dL (ref 3.5–5.2)
Alkaline Phosphatase: 103 U/L (ref 40–129)
Anion Gap: 13 mmol/L (ref 7–16)
BUN: 20 mg/dL (ref 8–23)
CO2: 23 mmol/L (ref 22–29)
Calcium: 8.8 mg/dL (ref 8.6–10.2)
Chloride: 99 mmol/L (ref 98–107)
Creatinine: 1.2 mg/dL (ref 0.7–1.2)
GFR African American: >60
GFR Non-African American: 59 mL/min/{1.73_m2} (ref >=60)
Glucose: 101 mg/dL (ref 74–109)
Potassium: 3.6 mmol/L (ref 3.5–5.0)
Sodium: 135 mmol/L (ref 132–146)
Total Bilirubin: 4.5 mg/dL — ABNORMAL HIGH (ref 0.0–1.2)
Total Protein: 6 g/dL — ABNORMAL LOW (ref 6.4–8.3)

## 2015-06-23 LAB — PLATELET CONFIRMATION

## 2015-06-23 LAB — EKG 12-LEAD
Atrial Rate: 77 {beats}/min
P Axis: 89 degrees
P-R Interval: 206 ms
Q-T Interval: 366 ms
QRS Duration: 96 ms
QTc Calculation (Bazett): 414 ms
R Axis: -20 degrees
T Axis: 65 degrees
Ventricular Rate: 77 {beats}/min

## 2015-06-23 LAB — LACTIC ACID: Lactic Acid: 2.3 mmol/L — ABNORMAL HIGH (ref 0.5–2.2)

## 2015-06-23 MED ORDER — PIPERACILLIN SOD-TAZOBACTAM SO 3.375 (3-0.375) G IV SOLR
3.375 (3-0.375) g | Freq: Three times a day (TID) | INTRAVENOUS | Status: DC
Start: 2015-06-23 — End: 2015-06-24
  Administered 2015-06-23 – 2015-06-24 (×4): via INTRAVENOUS

## 2015-06-23 MED ORDER — ATORVASTATIN CALCIUM 40 MG PO TABS
40 MG | Freq: Every day | ORAL | Status: DC
Start: 2015-06-23 — End: 2015-06-28
  Administered 2015-06-23 – 2015-06-28 (×6): via ORAL

## 2015-06-23 MED ORDER — CIPROFLOXACIN HCL 250 MG PO TABS
250 MG | Freq: Two times a day (BID) | ORAL | Status: DC
Start: 2015-06-23 — End: 2015-06-23
  Administered 2015-06-23: 15:00:00 via ORAL

## 2015-06-23 MED ORDER — PROMETHAZINE HCL 25 MG/ML IJ SOLN
25 MG/ML | INTRAMUSCULAR | Status: DC | PRN
Start: 2015-06-23 — End: 2015-06-23
  Administered 2015-06-23 (×2): via INTRAVENOUS

## 2015-06-23 MED ORDER — ACETAMINOPHEN 325 MG PO TABS
325 MG | Freq: Once | ORAL | Status: AC
Start: 2015-06-23 — End: 2015-06-22
  Administered 2015-06-23: 04:00:00 650 mg via ORAL

## 2015-06-23 MED ORDER — AMLODIPINE BESYLATE 2.5 MG PO TABS
2.5 MG | Freq: Every evening | ORAL | Status: DC
Start: 2015-06-23 — End: 2015-06-28
  Administered 2015-06-24 – 2015-06-28 (×5): via ORAL

## 2015-06-23 MED ORDER — HYDROCHLOROTHIAZIDE 25 MG PO TABS
25 MG | Freq: Every day | ORAL | Status: DC
Start: 2015-06-23 — End: 2015-06-28
  Administered 2015-06-24 – 2015-06-28 (×5): via ORAL

## 2015-06-23 MED ORDER — MEPERIDINE HCL 25 MG/ML IJ SOLN
25 MG/ML | INTRAMUSCULAR | Status: DC | PRN
Start: 2015-06-23 — End: 2015-06-28
  Administered 2015-06-23 – 2015-06-24 (×3): via INTRAVENOUS

## 2015-06-23 MED ORDER — SODIUM CHLORIDE 0.9 % IV SOLN
0.9 % | Freq: Once | INTRAVENOUS | Status: AC
Start: 2015-06-23 — End: 2015-06-23
  Administered 2015-06-23: 13:00:00 via INTRAVENOUS

## 2015-06-23 MED ORDER — PROCHLORPERAZINE EDISYLATE 5 MG/ML IJ SOLN
5 MG/ML | Freq: Four times a day (QID) | INTRAMUSCULAR | Status: DC | PRN
Start: 2015-06-23 — End: 2015-06-28
  Administered 2015-06-23: 20:00:00 via INTRAVENOUS

## 2015-06-23 MED ORDER — TRAMADOL HCL 50 MG PO TABS
50 MG | Freq: Four times a day (QID) | ORAL | Status: DC | PRN
Start: 2015-06-23 — End: 2015-06-28
  Administered 2015-06-25: 02:00:00 50 mg via ORAL

## 2015-06-23 MED ORDER — DOCUSATE SODIUM 100 MG PO CAPS
100 MG | Freq: Two times a day (BID) | ORAL | Status: DC
Start: 2015-06-23 — End: 2015-06-28
  Administered 2015-06-24 – 2015-06-28 (×9): via ORAL

## 2015-06-23 MED ORDER — SODIUM CHLORIDE 0.9 % IV SOLN
0.9 % | Freq: Once | INTRAVENOUS | Status: AC
Start: 2015-06-23 — End: 2015-06-23
  Administered 2015-06-23: 04:00:00 via INTRAVENOUS

## 2015-06-23 MED ORDER — DEXTROSE 5 % IV SOLN (MINI-BAG)
5 % | Freq: Once | INTRAVENOUS | Status: AC
Start: 2015-06-23 — End: 2015-06-23
  Administered 2015-06-23: 04:00:00 3.375 g via INTRAVENOUS

## 2015-06-23 MED ORDER — SODIUM CHLORIDE 0.9 % IV SOLN
0.9 % | Freq: Once | INTRAVENOUS | Status: AC
Start: 2015-06-23 — End: 2015-06-23
  Administered 2015-06-23: 06:00:00 via INTRAVENOUS

## 2015-06-23 MED ORDER — ONDANSETRON HCL 4 MG/2ML IJ SOLN
4 MG/2ML | INTRAMUSCULAR | Status: AC
Start: 2015-06-23 — End: 2015-06-23
  Administered 2015-06-23: 07:00:00

## 2015-06-23 MED ORDER — VANCOMYCIN 1500 MG IN DEXTROSE 5% 300 ML IVPB
Freq: Once | Status: AC
Start: 2015-06-23 — End: 2015-06-23
  Administered 2015-06-24: 02:00:00 1.5 mg via INTRAVENOUS

## 2015-06-23 MED ORDER — CHOLESTYRAMINE LIGHT 4 G PO PACK
4 g | Freq: Every day | ORAL | Status: DC
Start: 2015-06-23 — End: 2015-06-28
  Administered 2015-06-23 – 2015-06-28 (×6): via ORAL

## 2015-06-23 MED ORDER — VANCOMYCIN 1500 MG IN DEXTROSE 5% 300 ML IVPB
Freq: Once | Status: DC
Start: 2015-06-23 — End: 2015-06-23

## 2015-06-23 MED ORDER — ONDANSETRON HCL 4 MG/2ML IJ SOLN
4 MG/2ML | Freq: Once | INTRAMUSCULAR | Status: AC
Start: 2015-06-23 — End: 2015-06-22
  Administered 2015-06-23: 05:00:00 4 mg via INTRAVENOUS

## 2015-06-23 MED ORDER — ACETAMINOPHEN 325 MG PO TABS
325 MG | Freq: Four times a day (QID) | ORAL | Status: DC | PRN
Start: 2015-06-23 — End: 2015-06-28
  Administered 2015-06-23 – 2015-06-26 (×7): 650 mg via ORAL

## 2015-06-23 MED ORDER — ONDANSETRON HCL 4 MG PO TABS
4 MG | Freq: Three times a day (TID) | ORAL | Status: DC | PRN
Start: 2015-06-23 — End: 2015-06-28
  Administered 2015-06-23 – 2015-06-27 (×2): via ORAL

## 2015-06-23 MED FILL — PROCHLORPERAZINE EDISYLATE 5 MG/ML IJ SOLN: 5 MG/ML | INTRAMUSCULAR | Qty: 2

## 2015-06-23 MED FILL — DOCUSATE SODIUM 100 MG PO CAPS: 100 MG | ORAL | Qty: 1

## 2015-06-23 MED FILL — PROMETHAZINE HCL 25 MG/ML IJ SOLN: 25 MG/ML | INTRAMUSCULAR | Qty: 1

## 2015-06-23 MED FILL — ZOSYN 3.375 (3-0.375) G IV SOLR: 3.375 (3-0.375) g | INTRAVENOUS | Qty: 3.38

## 2015-06-23 MED FILL — PREVALITE 4 G PO PACK: 4 g | ORAL | Qty: 1

## 2015-06-23 MED FILL — CIPROFLOXACIN HCL 250 MG PO TABS: 250 MG | ORAL | Qty: 1

## 2015-06-23 MED FILL — PIPERACILLIN SOD-TAZOBACTAM SO 3.375 (3-0.375) G IV SOLR: 3.375 (3-0.375) g | INTRAVENOUS | Qty: 3.38

## 2015-06-23 MED FILL — LIPITOR 40 MG PO TABS: 40 MG | ORAL | Qty: 2

## 2015-06-23 MED FILL — ONDANSETRON HCL 4 MG/2ML IJ SOLN: 4 MG/2ML | INTRAMUSCULAR | Qty: 2

## 2015-06-23 MED FILL — ONDANSETRON HCL 4 MG PO TABS: 4 MG | ORAL | Qty: 1

## 2015-06-23 MED FILL — VANCOMYCIN 1500 MG IN DEXTROSE 5% 300 ML IVPB: Qty: 1.5

## 2015-06-23 MED FILL — AMLODIPINE BESYLATE 2.5 MG PO TABS: 2.5 MG | ORAL | Qty: 1

## 2015-06-23 MED FILL — MAPAP 325 MG PO TABS: 325 MG | ORAL | Qty: 2

## 2015-06-23 MED FILL — DEMEROL 25 MG/ML IJ SOLN: 25 MG/ML | INTRAMUSCULAR | Qty: 1

## 2015-06-23 MED FILL — HYDROCHLOROTHIAZIDE 25 MG PO TABS: 25 MG | ORAL | Qty: 1

## 2015-06-23 NOTE — ED Notes (Signed)
[id number] per Dr. Sissy Hoffombrowski Dr. Loyal GamblerStyn was called.  Left message with answering service.  vp     Rhoderick MoodyVeronica L Beverely Suen  06/23/15 [phone]

## 2015-06-23 NOTE — Consults (Addendum)
Infectious Disease Consult Note     Admit Date: 06/23/2015 12:56 AM    Chief complaint:   Chief Complaint   Patient presents with   ??? Post-op Problem     prostate biopsy 2 days ago, here with same symptoms yesterday       Reason for Consult:  Fever sepsis    Requesting Physician:  Gaynelle Cage, MD      HISTORY OF PRESENT ILLNESS:    This is a 75 y.o. male who was found to have elevated PSA and underwetn transrectal prostate biopsy , with gentamycin preop, in [facility] and develop fever chills came to ER and was [facility] with oral antibiotic ciprofloxacin 06/22/15. He continue to have fever chills 103 burning urination blood in urine, frequency. No flank pain. Lower abd pain noted. Reported nausea vomitting no diarrhea. He was found to have low grade fever 100.4 no leukocytosis in ER. He was given zosyn. ID consulted for further recommendation. Foley placed with some darker urine and some retention.        REVIEW OF SYSTEMS:    Constitutional:+ve  Fever, chills or rigors. No unexplained weight loss.  HEENT: no headache, dizziness or lightheadedness. No sore throat or runny nose.  Respiratory: no cough, shortness of breath or wheeze.  Cardiovascular: no chest pain or palpitations  Gastrointestinal: no nausea, vomiting, diarrhea, constipation. No blood in stool.  Genitourinary: +ve dysuria, hematuria,  Frequency. no  incontinence.  Musculoskeletal: no joint pain, or bodyache.  Neurologic: no h/o passing out, focal weakness or seizure.  Skin: no h/o rashes or easy bruising.  Hematologic: no gum bleeding. No hemoptysis.  Psych: No depression or suicidal ideation  Endocrine: No polyuria or poly dypsia    Past Medical History:    Past Medical History   Diagnosis Date   ??? Allergy    ??? Asthma      doing well per patient   ??? Chronic pansinusitis 10/03/2014   ??? CSS (Churg-Strauss syndrome) (HCC)      no current issues   ??? Diabetes mellitus (HCC)      patient denies   ??? Elevated PSA      negative biospy   ??? GERD  (gastroesophageal reflux disease)    ??? Hyperlipidemia    ??? OSA treated with BiPAP 03/27/2014   ??? Sinusitis    ??? Sleep apnea      [facility] 18/9     Past Surgical History:    Past Surgical History   Procedure Laterality Date   ??? Gastric fundoplication  2005   ??? Septoplasty     ??? Hernia repair     ??? Nasal polyp surgery     ??? Prostate biopsy  2011     negative pathology   ??? Upper gastrointestinal endoscopy  12/2009     with biopsy, pathology negative   ??? Tumor removal  11/2010     fatty tumor removed Dr Lavone Nian   ??? Endoscopy, colon, diagnostic  01/22/2014     Family History:   Family History   Problem Relation Age of Onset   ??? High Blood Pressure Father    ??? High Cholesterol Father    ??? Asthma Sister    ??? Diabetes Sister      Social History:   Social History     Social History   ??? Marital status: Married     Spouse name: N/A   ??? Number of children: N/A   ??? Years of education: N/A  Occupational History   ??? Not on file.     Social History Main Topics   ??? Smoking status: Never Smoker   ??? Smokeless tobacco: Never Used   ??? Alcohol use No   ??? Drug use: No   ??? Sexual activity: Yes     Other Topics Concern   ??? Not on file     Social History Narrative     Allergies:   Allergies   Allergen Reactions   ??? Enalapril Swelling     Of lower lip   ??? Esomeprazole Hives and Swelling   ??? Nexium [Esomeprazole Magnesium Trihydrate] Swelling   ??? Nsaids Swelling   ??? Ranitidine Hives and Swelling   ??? Reglan [Metoclopramide Hcl] Other (See Comments)     States has bloating with this       Current Medications:     Scheduled Meds:  ??? amLODIPine  2.5 mg Oral Nightly   ??? atorvastatin  80 mg Oral Daily   ??? cholestyramine light  4 g Oral Daily   ??? hydrochlorothiazide  25 mg Oral Daily   ??? piperacillin-tazobactam  3.375 g Intravenous Q8H   ??? docusate sodium  100 mg Oral BID     Continuous Infusions:   PRN Meds:ondansetron, traMADol, acetaminophen, meperidine, prochlorperazine      PHYSICAL EXAM:  Vitals:    Visit Vitals   ??? BP (!) 155/72   ??? Pulse 85   ??? Temp  99.9 ??F (37.7 ??C) (Oral)   ??? Resp 16   ??? Ht 5\' 9"  (1.753 m)   ??? Wt 204 lb 12.8 oz (92.9 kg)   ??? SpO2 92%   ??? BMI 30.24 kg/m2     GA: Alert, awake, not in acute distress  HEENT: Not pale, no icteric sclerae, no oral thrush  Neck: No cervical lymphadenopathy  Heart: Normal S1, S2, no murmur  Lungs: Clear to auscultation both lungs. No adventitious sounds.   Abdomen: Bowel sound present. Soft, not tender, no guarding. No hepatosplenomegaly. No CVA tendenreness  Ext: No edema  Neuro: A&Ox3. No focal weakness or numbness.   Skin: No rash, no bruises, no thromboembolic phenomenon.  Psych: Appropriate mood and affect.  Line:foley    LABS AND DATA REVIEW:   CBC With Differential:    Lab Results   Component Value Date    WBC 7.6 06/22/2015    RBC 4.59 06/22/2015    HGB 12.9 06/22/2015    HGB 14.7 06/21/2009    HCT 39.8 06/22/2015    HCT 43.8 06/21/2009    PLT 91 06/22/2015    MCV 86.7 06/22/2015    MCH 28.1 06/22/2015    MCHC 32.4 06/22/2015    RDW 13.9 06/22/2015    NRBC 0.0 06/22/2015    LYMPHOPCT 1.7 06/22/2015    MONOPCT 0.9 06/22/2015    BASOPCT 0.0 06/22/2015    MONOSABS 0.08 06/22/2015    LYMPHSABS 0.23 06/22/2015    EOSABS 0.00 06/22/2015    BASOSABS 0.00 06/22/2015   C-reactive protein  Lab Results   Component Value Date    SEDRATE 7 02/13/2011        CMP:    Lab Results   Component Value Date    NA 135 06/22/2015    K 3.6 06/22/2015    CL 99 06/22/2015    CO2 23 06/22/2015    BUN 20 06/22/2015    BUN 20 06/21/2009    CREATININE 1.2 06/22/2015    CREATININE 0.8 06/21/2009  GFRAA >60 06/22/2015    LABGLOM 59 06/22/2015    LABGLOM >60 06/21/2009    GLUCOSE 101 06/22/2015    GLUCOSE 86 06/21/2009    PROT 6.0 06/22/2015    LABALBU 3.6 06/22/2015    CALCIUM 8.8 06/22/2015    BILITOT 4.5 06/22/2015    ALKPHOS 103 06/22/2015    AST 36 06/22/2015    ALT 29 06/22/2015       Lab Results   Component Value Date    SEDRATE 7 02/13/2011     No results found for: CRP    RADIOLOGY: reviewed    MICROBIOLOGY:  reviewed      ASSESSMENT  1. Sepsis post transrectal prostate biopsy, complicated UTI  2. Elevated PSA    RECOMMENDATION  - continue zosyn 3.375 Q8  - vancomycin 1.5g x1  - follow cultures  - IV fluid  - foley, check residual if remove for possible retention  - uro following        Ethelle LyonHRISSAWAN Eirik Schueler, MD  06/23/15  4:43 PM

## 2015-06-23 NOTE — Progress Notes (Signed)
Patient already had the FLU vaccine for this year as well as the pneumonia vaccine within the last 5 years.

## 2015-06-23 NOTE — Progress Notes (Signed)
Dr.  Lenoria ChimeSungkapalee  Notified of consult via answering service  S. Jahsiah Carpenter UHA/US2/MT 06/23/2015 9:29 AM

## 2015-06-23 NOTE — H&P (Signed)
H&P dictated  Julian CageNICHOLAS R. Julian Lamorte, MD

## 2015-06-23 NOTE — Progress Notes (Signed)
Chart check completed from 0700-1900.

## 2015-06-23 NOTE — H&P (Signed)
[facility]                     [address]  [place], [place]  [id number]                                 HISTORY AND PHYSICAL    PATIENT NAME:  Julian Castaneda, Julian J.             DOB:      [birth year]  MED REC NO:    [id number]                         ROOM:      0446A  ACCOUNT NO:    [id number]                       ADMISSION DATE: 06/23/2015  PHYSICIAN:     Laneta SimmersNicholas R. Loyal GamblerStyn, MD                CHIEF COMPLAINT:  Systemic inflammatory response syndrome.    HISTORY OF PRESENT ILLNESS:  The patient is a 75 year old gentleman who is a  patient of Dr. [name] Ricchiuti.  Friday afternoon, he underwent an MRI  fusion-guided prostate biopsy by me.  This was for an anteriorly-based lesion  and a rising PSA.  Postoperatively, the patient felt well and was voiding  without issue.  Friday night, he had an episode of chills and presented to the  Emergency Room.  He was noted to be afebrile with normal labs and was feeling  better.  He was ultimately discharged home.  I contacted his son Saturday  afternoon and he was doing well.  Yesterday evening, he then developed another  episode of chills with a fever at home subjectively of 103.  He represented to  the Emergency Department and was found to have a temperature of 100.4.  Given  this, I discussed the need for admission with cultures and antibiotics.      Currently, the patient states while at home he was urinating, but very  frequent small volumes.  He said he had some mild dysuria.  He has not noticed  any blood in his urine.  Currently, he states that the chills have resolved.   He is having no fevers.  He states that he overall has some mild nausea, but  the Zofran has not been helping.      REVIEW OF SYSTEMS:    CONSTITUTIONAL:  Positive for fatigue.  CARDIAC:  He denies chest pain.  PULMONARY:  He denies shortness of breath.  GASTROINTESTINAL:  He is having intermittent nausea.  He states that his  overall p.o. intake has  been minimal.    GENITOURINARY:  Positive for urgency, frequency and weak urinary stream.      Remainder of review of systems are negative except for that in the HPI.    PAST MEDICAL HISTORY:    1.  Chronic sinusitis.  2.  Asthma.  3.  Elevated PSA with prostate nodule on MRI.    PAST SURGICAL HISTORY:  Include:    1.  Gastric fundoplication.  2.  Septoplasty.  3.  Hernia repair.  4.  Negative transrectal ultrasound of the prostate in 2011.    5.  Recent MRI fusion-guided prostate targeted biopsy.  6.  Upper endoscopy.    HOME  MEDICATIONS:  Include amlodipine 2.5 milligrams daily, Lipitor 80  milligrams daily, cholestyramine 4 grams daily, hydrochlorothiazide 25  milligrams b.i.d., Colace 100 milligrams daily, Ultram 50 milligrams as needed  for pain.    ALLERGIES:  ENALAPRIL, NEXIUM, NONSTEROIDALS, ZANTAC AND REGLAN.    SOCIAL HISTORY:  The patient resides in [place].  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  He is a nonsmoker.   Reports occasional consumption of wine with meals.      FAMILY HISTORY:  Noncontributory.     PHYSICAL EXAMINATION:  GENERAL:  The patient is alert and appropriate.  He is in no apparent  distress.    VITAL SIGNS:  Reveal temperature is 98.4, pulse is 70, respiratory rate is 18,  blood pressure is 127/71.  Temperature max during hospitalization is 100.4.  PUPILS:  Pupils are equal and reactive to light.  NECK:  Supple and without adenopathy.  HEENT:  Reveals dry mucous membranes.  CARDIOVASCULAR:  S1, S2 is appreciable.  PULMONARY:  He has clear breath sounds bilaterally.  ABDOMEN:  Reveals a soft, nondistended abdomen with no guarding, no rebound,  no CVA tenderness.    GENITOURINARY:  He has an indwelling Foley catheter that is draining  concentrated urine without any blood.  MUSCULOSKELETAL:  He has full range of motion in his upper and lower  extremities.  NEUROLOGIC:  He has intact sensation to light touch.    LABORATORIES REVIEWED:  Reveal a white  count of 7.6, hematocrit is 39.8,  platelet count is 91.  BUN is noted to be 20, creatinine is 1.2.  Lactic acid  is 2.3.  Urine microscopy revealed moderate bacteria and as expected, red  cells given the recent biopsy.  Urinalysis is positive for leukocyte esterase,  positive for nitrite.     IMPRESSION:  The patient is a 75 year old gentleman who has a rising PSA who  is status post MRI fusion-guided prostate biopsy of an anteriorly-based  lesion.  He had developed episodes of chills, rigors and subjective  temperature at home.  This likely represents systemic inflammatory response.   The patient was given IV gentamicin 160 milligrams preoperatively.  He was  also on oral antibiotics leading up to the procedure.  His cultures are  pending at the time of this dictation.  I recommend leaving his catheter  placed for full decompression.  He will undergo aggressive IV hydration.  We  will control his nausea by adding Zofran and intermixing this with Phenergan.   His home medications were restarted.  I discussed with the patient the need to  remain in the hospital for continued observation to ensure he does not develop  another high fever.        Laneta Simmers. Loyal Gambler, MD      D: 06/23/2015 08:44:09  T: 06/23/2015 09:38:31  NRS/nts  Job#: [id number]  Doc#: [id number]

## 2015-06-23 NOTE — Progress Notes (Signed)
Spoke with Dr. Lenoria ChimeSungkapalee regarding new consult on pt.

## 2015-06-24 ENCOUNTER — Inpatient Hospital Stay: Admit: 2015-06-24 | Primary: Family Medicine

## 2015-06-24 DIAGNOSIS — R509 Fever, unspecified: Secondary | ICD-10-CM

## 2015-06-24 LAB — CBC WITH AUTO DIFFERENTIAL
Basophils %: 0.2 % (ref 0.0–2.0)
Basophils Absolute: 0.02 E9/L (ref 0.00–0.20)
Eosinophils %: 0.1 % (ref 0.0–6.0)
Eosinophils Absolute: 0.01 E9/L — ABNORMAL LOW (ref 0.05–0.50)
Hematocrit: 38.5 % (ref 37.0–54.0)
Hemoglobin: 12.7 g/dL (ref 12.5–16.5)
Immature Granulocytes #: 0.05 E9/L
Immature Granulocytes %: 0.6 % (ref 0.0–5.0)
Lymphocytes %: 7.5 % — ABNORMAL LOW (ref 20.0–42.0)
Lymphocytes Absolute: 0.67 E9/L — ABNORMAL LOW (ref 1.50–4.00)
MCH: 28.6 pg (ref 26.0–35.0)
MCHC: 33 % (ref 32.0–34.5)
MCV: 86.7 fL (ref 80.0–99.9)
MPV: 11.8 fL (ref 7.0–12.0)
Monocytes %: 6.5 % (ref 2.0–12.0)
Monocytes Absolute: 0.58 E9/L (ref 0.10–0.95)
Neutrophils %: 85.1 % — ABNORMAL HIGH (ref 43.0–80.0)
Neutrophils Absolute: 7.57 E9/L — ABNORMAL HIGH (ref 1.80–7.30)
Platelets: 77 E9/L — ABNORMAL LOW (ref 130–450)
RBC: 4.44 E12/L (ref 3.80–5.80)
RDW: 14.4 fL (ref 11.5–15.0)
WBC: 8.9 E9/L (ref 4.5–11.5)

## 2015-06-24 LAB — PLATELET CONFIRMATION

## 2015-06-24 MED ORDER — SODIUM CHLORIDE 0.9 % IV SOLN (MINI-BAG)
0.9 % | Freq: Three times a day (TID) | INTRAVENOUS | Status: DC
Start: 2015-06-24 — End: 2015-06-26
  Administered 2015-06-24 – 2015-06-26 (×6): 1 g via INTRAVENOUS

## 2015-06-24 MED ORDER — NORMAL SALINE FLUSH 0.9 % IV SOLN
0.9 % | INTRAVENOUS | Status: AC
Start: 2015-06-24 — End: 2015-06-24
  Administered 2015-06-24: 13:00:00

## 2015-06-24 MED ORDER — SODIUM CHLORIDE 0.9 % IV SOLN (MINI-BAG)
0.9 % | Freq: Three times a day (TID) | INTRAVENOUS | Status: DC
Start: 2015-06-24 — End: 2015-06-24

## 2015-06-24 MED FILL — MERREM 1 G IV SOLR: 1 g | INTRAVENOUS | Qty: 1

## 2015-06-24 MED FILL — DOCUSATE SODIUM 100 MG PO CAPS: 100 MG | ORAL | Qty: 1

## 2015-06-24 MED FILL — DEMEROL 25 MG/ML IJ SOLN: 25 MG/ML | INTRAMUSCULAR | Qty: 1

## 2015-06-24 MED FILL — MONOJECT FLUSH SYRINGE 0.9 % IV SOLN: 0.9 % | INTRAVENOUS | Qty: 10

## 2015-06-24 MED FILL — PIPERACILLIN SOD-TAZOBACTAM SO 3.375 (3-0.375) G IV SOLR: 3.375 (3-0.375) g | INTRAVENOUS | Qty: 3.38

## 2015-06-24 MED FILL — PROCHLORPERAZINE EDISYLATE 5 MG/ML IJ SOLN: 5 MG/ML | INTRAMUSCULAR | Qty: 2

## 2015-06-24 MED FILL — MAPAP 325 MG PO TABS: 325 MG | ORAL | Qty: 2

## 2015-06-24 MED FILL — PREVALITE 4 G PO PACK: 4 g | ORAL | Qty: 1

## 2015-06-24 MED FILL — HYDROCHLOROTHIAZIDE 25 MG PO TABS: 25 MG | ORAL | Qty: 1

## 2015-06-24 MED FILL — AMLODIPINE BESYLATE 2.5 MG PO TABS: 2.5 MG | ORAL | Qty: 1

## 2015-06-24 MED FILL — VANCOMYCIN 1500 MG IN DEXTROSE 5% 300 ML IVPB: Qty: 1.5

## 2015-06-24 MED FILL — LIPITOR 40 MG PO TABS: 40 MG | ORAL | Qty: 2

## 2015-06-24 NOTE — Other (Addendum)
Patient Acct Nbr:  [id number]  Primary AUTH/CERT:    Primary Insurance Company Name:   [employer]  Primary Insurance Plan Name:  MEDICARE  Primary Insurance Group Number:    [facility]: [facility]  Primary Insurance Policy Number:  [id number]    Secondary AUTH/CERT:    Secondary Insurance Company Name:   MEDICARE(NON-HMO)SUPPLEMENTAL  Secondary Insurance Plan Name:  [employer] MEDICARE SELECT  Secondary Insurance Group Number:  [id number]  Secondary Insurance Plan Type: B  Secondary Insurance Policy Number:  [id number]

## 2015-06-24 NOTE — Progress Notes (Signed)
[facility]  [facility]  Progress Note    Chief Complaint   Patient presents with   ??? Post-op Problem     prostate biopsy 2 days ago, here with same symptoms yesterday       SUBJECTIVE:    Afebrile, no leukocytosis  Patient is tolerating medications. No reported adverse drug reactions.  No nausea, vomiting, diarrhea.    Medications:  Scheduled Meds:  ??? meropenem  1 g Intravenous Q8H   ??? amLODIPine  2.5 mg Oral Nightly   ??? atorvastatin  80 mg Oral Daily   ??? cholestyramine light  4 g Oral Daily   ??? hydrochlorothiazide  25 mg Oral Daily   ??? docusate sodium  100 mg Oral BID     Continuous Infusions:   PRN Meds:ondansetron, traMADol, acetaminophen, meperidine, prochlorperazine    Allergy reviewed:  Enalapril; Esomeprazole; Nexium [esomeprazole magnesium trihydrate]; Nsaids; Ranitidine; and Reglan [metoclopramide hcl]    OBJECTIVE:  Visit Vitals   ??? BP (!) 176/84   ??? Pulse 87   ??? Temp 101.5 ??F (38.6 ??C) (Oral)  Comment: RN notified   ??? Resp 20   ??? Ht 5\' 9"  (1.753 m)   ??? Wt 204 lb 12.8 oz (92.9 kg)   ??? SpO2 97%   ??? BMI 30.24 kg/m2     GA: Alert, awake, not in acute distress  HEENT: Not pale, no icteric sclerae, no oral thrush  Neck: No cervical lymphadenopathy  Heart: Normal S1, S2, no murmur  Lungs: Clear to auscultation both lungs. No adventitious sounds.   Abdomen: Bowel sound present. Soft, not tender, no guarding. No hepatosplenomegaly. No CVA tendenreness  Ext: No edema  Neuro: A&Ox3. No focal weakness or numbness.   Skin: No rash, no bruises, no thromboembolic phenomenon.  Psych: Appropriate mood and affect.  Line:foley  ??    Laboratory and Tests Review:  Lab Results   Component Value Date    WBC 8.9 06/24/2015    WBC 7.6 06/22/2015    WBC 8.1 06/22/2015    HGB 12.7 06/24/2015    HCT 38.5 06/24/2015    MCV 86.7 06/24/2015    PLT 77 (L) 06/24/2015     Lab Results   Component Value Date    NEUTROABS 7.57 (H) 06/24/2015    NEUTROABS 7.30 06/22/2015    NEUTROABS 7.26 06/22/2015     No  results found for: CRP  Lab Results   Component Value Date    SEDRATE 7 02/13/2011    SEDRATE 2 02/27/2010     Lab Results   Component Value Date    ALT 29 06/22/2015    AST 36 06/22/2015    ALKPHOS 103 06/22/2015    BILITOT 4.5 (H) 06/22/2015     Lab Results   Component Value Date    NA 135 06/22/2015    K 3.6 06/22/2015    CL 99 06/22/2015    CO2 23 06/22/2015    BUN 20 06/22/2015    BUN 20 06/21/2009    CREATININE 1.2 06/22/2015    CREATININE 1.1 06/22/2015    CREATININE 1.0 07/14/2014    CREATININE 0.8 06/21/2009    GFRAA >60 06/22/2015    LABGLOM 59 06/22/2015    LABGLOM >60 06/21/2009    GLUCOSE 101 06/22/2015    GLUCOSE 86 06/21/2009    PROT 6.0 06/22/2015    LABALBU 3.6 06/22/2015    CALCIUM 8.8 06/22/2015    BILITOT 4.5 06/22/2015    ALKPHOS  103 06/22/2015    AST 36 06/22/2015    ALT 29 06/22/2015       Radiology:  Reviewed    Microbiology:   Cultures reviewed  Urine Culture [[id number]] (Abnormal) Collected: 06/22/15 2320    ?? Order Status: Completed Specimen: Urine, straight catheter Updated: 06/24/15 1207   ??  Organism Escherichia coli (A)   ??  Urine Culture, Routine --   ??  >100,000 CFU/ml   Sensitivity to follow      ?? Narrative: ??   ?? Source: [facility] ?? ?? ?? Site: ?? ?? ?? ?? ??    ?? Culture Blood #2 [[id number]] (Abnormal) Collected: 06/22/15 2333   ?? Order Status: Completed Specimen: Blood Whole Citrate Updated: 06/24/15 1021   ??  Culture, Blood 2 -- (A)   ??  Gram stain performed from blood culture bottle media   Gram negative rods   Growth in both bottles      ?? Narrative: ??   ?? CALL ??[facility] tel. ,  Microbiology results called to and read back by Archie BalboaJason Akers RN, 06/24/2015  10:21, by Luanne BrasBOYLI   ?? Culture Blood #1 [[id number]] (Abnormal) Collected: 06/22/15 2323   ?? Order Status: Completed Specimen: Blood Whole Citrate Updated: 06/24/15 1016   ??  Blood Culture, Routine -- (A)   ??  Gram stain performed from blood culture bottle media   Gram negative rods   Growth in both bottles      ?? Narrative: ??   ?? CALL  ??[facility] tel. ,  Microbiology results called to and read back by Archie BalboaJason Akers RN, 06/24/2015  10:16, by Luanne BrasBOYLI       ??  ASSESSMENT  1. Sepsis post transrectal prostate biopsy, complicated UTI Ecolig  2. GNR bacteremia from above  2. Elevated PSA  ??  RECOMMENDATION  - change zosyn tomorrow , merrem 1 gm IV Q8  - vancomycin 1.5g x1  - follow cultures, repeat BC  - foley, check residual if remove for possible retention  - uro following  ??  ??  ??  Aimar Borghi  5:06 PM  06/24/2015

## 2015-06-24 NOTE — Other (Signed)
[facility] Quality Flow/Interdisciplinary Rounds Progress Note        Quality Flow Rounds held on June 24, 2015    Disciplines Attending:  Bedside Nurse, Social Worker, Case Manager and Nursing Unit Leadership    Julian Castaneda was admitted on 06/23/2015 12:56 AM    Anticipated Discharge Date:  Expected Discharge Date: 06/24/15    Disposition:    Braden Score:  Braden Scale Score: 20    Readmission Score:         Discussed patient goal for the day, patient clinical progression, and barriers to discharge.  The following Goal(s) of the Day/Commitment(s) have been identified:  IV antibiotics and continue to monitor.       Julian MemosCarla Castaneda  June 24, 2015

## 2015-06-24 NOTE — Progress Notes (Signed)
Left message with Dr. Lenoria ChimeSungkapalee office on blood cultures being positive in all 4 bottles for gram negative rods.

## 2015-06-24 NOTE — Progress Notes (Signed)
Julian Castaneda.                                          [facility]                                                           PROGRESS NOTE                                                                       06/24/2015        CHIEF UROLOGIC COMPLAINT: SIRS following MRI fusion prostate bx    HISTORY OF PRESENT ILLNESS:  Patient without new complaints. Shaking less often.  Tmax 100.3.  Feeling better    REVIEW OF SYSTEMS:   CONSTITUTIONAL: negative  HEENT: negative  HEMATOLOGIC: negative  ENDOCRINE: negative  RESPIRATORY: negative  CV: negative  GI: negative  NEURO: negative  ORTHOPEDICS: negative  PSYCHIATRIC: negative  GU: as above    PAST FAMILY HISTORY:    Family History   Problem Relation Age of Onset   ??? High Blood Pressure Father    ??? High Cholesterol Father    ??? Asthma Sister    ??? Diabetes Sister      PAST SOCIAL HISTORY:    Social History     Social History   ??? Marital status: Married     Spouse name: N/A   ??? Number of children: N/A   ??? Years of education: N/A     Social History Main Topics   ??? Smoking status: Never Smoker   ??? Smokeless tobacco: Never Used   ??? Alcohol use No   ??? Drug use: No   ??? Sexual activity: Yes     Other Topics Concern   ??? None     Social History Narrative       Scheduled Meds:  ??? amLODIPine  2.5 mg Oral Nightly   ??? atorvastatin  80 mg Oral Daily   ??? cholestyramine light  4 g Oral Daily   ??? hydrochlorothiazide  25 mg Oral Daily   ??? piperacillin-tazobactam  3.375 g Intravenous Q8H   ??? docusate sodium  100 mg Oral BID     Continuous Infusions:   PRN Meds:.ondansetron, traMADol, acetaminophen, meperidine, prochlorperazine    Visit Vitals   ??? BP (!) 141/67   ??? Pulse 81   ??? Temp 99.7 ??F (37.6 ??C) (Oral)   ??? Resp 18   ??? Ht 5\' 9"  (1.753 m)   ??? Wt 204 lb 12.8 oz (92.9 kg)   ??? SpO2 93%   ??? BMI 30.24 kg/m2       Lab Results   Component Value Date    WBC 7.6 06/22/2015    HGB 12.9 06/22/2015    HCT 39.8 06/22/2015    MCV 86.7 06/22/2015    PLT 91 (L) 06/22/2015       Lab Results   Component  Value Date  CREATININE 1.2 06/22/2015       No results found for: PSA        PHYSICAL EXAMINATION:  Skin dry, without rashes  Respirations non-labored, intact  Abdomen soft, non-tender, non-distended  Alert and oriented x3  Foley draining amber urine       ASSESSMENT AND PLAN:  1 SIRS s/p MRI fusion prostate bx  -continue IV abx  -continue foley catheter   -cultures pending  -Demerol PRN for shakes  -pathology pending          Electronically signed by Gaynelle Cage, MD on 06/24/2015 at 6:08 AM

## 2015-06-25 LAB — RESPIRATORY PANEL, MOLECULAR
Film Array Adenovirus: NOT DETECTED
Film Array Bordetella Pertusis: NOT DETECTED
Film Array Chlamydophilia Pneumoniae: NOT DETECTED
Film Array Conoravirus NL63: NOT DETECTED
Film Array Coronavirus 229E: NOT DETECTED
Film Array Coronavirus HKU1: NOT DETECTED
Film Array Coronavirus OC43: NOT DETECTED
Film Array Influenza A Virus 09H1: NOT DETECTED
Film Array Influenza A Virus H1: NOT DETECTED
Film Array Influenza A Virus H3: NOT DETECTED
Film Array Influenza A Virus: NOT DETECTED
Film Array Influenza B: NOT DETECTED
Film Array Metapneumovirus: NOT DETECTED
Film Array Mycoplasma Pneumoniae: NOT DETECTED
Film Array Parainfluenza Virus 1: NOT DETECTED
Film Array Parainfluenza Virus 2: NOT DETECTED
Film Array Parainfluenza Virus 3: NOT DETECTED
Film Array Parainfluenza Virus 4: NOT DETECTED
Film Array Respiratory Syncitial Virus: NOT DETECTED
Film Array Rhinovirus/Enterovirus: NOT DETECTED

## 2015-06-25 LAB — CULTURE, URINE: Urine Culture, Routine: >100000

## 2015-06-25 LAB — CBC WITH AUTO DIFFERENTIAL
Atypical Lymphocytes Relative: 4.3 % — ABNORMAL HIGH (ref 0.0–4.0)
Basophils %: 0 % (ref 0.0–2.0)
Basophils Absolute: 0 E9/L (ref 0.00–0.20)
Eosinophils %: 0 % (ref 0.0–6.0)
Eosinophils Absolute: 0 E9/L — ABNORMAL LOW (ref 0.05–0.50)
Hematocrit: 39 % (ref 37.0–54.0)
Hemoglobin: 13 g/dL (ref 12.5–16.5)
Lymphocytes %: 7 % — ABNORMAL LOW (ref 20.0–42.0)
Lymphocytes Absolute: 0.84 E9/L — ABNORMAL LOW (ref 1.50–4.00)
MCH: 28.4 pg (ref 26.0–35.0)
MCHC: 33.3 % (ref 32.0–34.5)
MCV: 85.2 fL (ref 80.0–99.9)
MPV: 10.5 fL (ref 7.0–12.0)
Monocytes %: 4.3 % (ref 2.0–12.0)
Monocytes Absolute: 0.3 E9/L (ref 0.10–0.95)
Neutrophils %: 84.4 % — ABNORMAL HIGH (ref 43.0–80.0)
Neutrophils Absolute: 6.38 E9/L (ref 1.80–7.30)
Platelets: 83 E9/L — ABNORMAL LOW (ref 130–450)
RBC: 4.58 E12/L (ref 3.80–5.80)
RDW: 14.3 fL (ref 11.5–15.0)
WBC: 7.6 E9/L (ref 4.5–11.5)
nRBC: 0 /100 WBC

## 2015-06-25 LAB — PLATELET CONFIRMATION

## 2015-06-25 MED ORDER — POTASSIUM CHLORIDE IN NACL 20-0.9 MEQ/L-% IV SOLN
INTRAVENOUS | Status: DC
Start: 2015-06-25 — End: 2015-06-28
  Administered 2015-06-25 – 2015-06-28 (×5): via INTRAVENOUS

## 2015-06-25 MED ORDER — SODIUM CHLORIDE 0.9 % IV BOLUS
0.9 % | Freq: Once | INTRAVENOUS | Status: AC
Start: 2015-06-25 — End: 2015-06-25
  Administered 2015-06-25: 22:00:00 250 mL via INTRAVENOUS

## 2015-06-25 MED ORDER — NORMAL SALINE FLUSH 0.9 % IV SOLN
0.9 % | INTRAVENOUS | Status: AC
Start: 2015-06-25 — End: 2015-06-25
  Administered 2015-06-25: 22:00:00

## 2015-06-25 MED FILL — PROCHLORPERAZINE EDISYLATE 5 MG/ML IJ SOLN: 5 MG/ML | INTRAMUSCULAR | Qty: 2

## 2015-06-25 MED FILL — MERREM 1 G IV SOLR: 1 g | INTRAVENOUS | Qty: 1

## 2015-06-25 MED FILL — PREVALITE 4 G PO PACK: 4 g | ORAL | Qty: 1

## 2015-06-25 MED FILL — LIPITOR 40 MG PO TABS: 40 MG | ORAL | Qty: 2

## 2015-06-25 MED FILL — HYDROCHLOROTHIAZIDE 25 MG PO TABS: 25 MG | ORAL | Qty: 1

## 2015-06-25 MED FILL — MAPAP 325 MG PO TABS: 325 MG | ORAL | Qty: 2

## 2015-06-25 MED FILL — DOCUSATE SODIUM 100 MG PO CAPS: 100 MG | ORAL | Qty: 1

## 2015-06-25 MED FILL — TRAMADOL HCL 50 MG PO TABS: 50 MG | ORAL | Qty: 1

## 2015-06-25 MED FILL — POTASSIUM CHLORIDE IN NACL 20-0.9 MEQ/L-% IV SOLN: INTRAVENOUS | Qty: 1000

## 2015-06-25 MED FILL — MONOJECT FLUSH SYRINGE 0.9 % IV SOLN: 0.9 % | INTRAVENOUS | Qty: 10

## 2015-06-25 MED FILL — AMLODIPINE BESYLATE 2.5 MG PO TABS: 2.5 MG | ORAL | Qty: 1

## 2015-06-25 NOTE — Progress Notes (Signed)
[facility]  [facility]  Progress Note    Chief Complaint   Patient presents with   ??? Post-op Problem     prostate biopsy 2 days ago, here with same symptoms yesterday       SUBJECTIVE:    Afebrile, no leukocytosis  Patient is tolerating medications. No reported adverse drug reactions.  No nausea, vomiting, diarrhea.    Medications:  Scheduled Meds:  ??? meropenem  1 g Intravenous Q8H   ??? amLODIPine  2.5 mg Oral Nightly   ??? atorvastatin  80 mg Oral Daily   ??? cholestyramine light  4 g Oral Daily   ??? hydrochlorothiazide  25 mg Oral Daily   ??? docusate sodium  100 mg Oral BID     Continuous Infusions:  ??? 0.9% NaCl with KCl 20 mEq 75 mL/hr at 06/25/15 1644     PRN Meds:ondansetron, traMADol, acetaminophen, meperidine, prochlorperazine    Allergy reviewed:  Enalapril; Esomeprazole; Nexium [esomeprazole magnesium trihydrate]; Nsaids; Ranitidine; and Reglan [metoclopramide hcl]    OBJECTIVE:  Visit Vitals   ??? BP 126/74   ??? Pulse 101   ??? Temp 99.1 ??F (37.3 ??C) (Oral)   ??? Resp 18   ??? Ht 5\' 9"  (1.753 m)   ??? Wt 200 lb (90.7 kg)   ??? SpO2 93%   ??? BMI 29.53 kg/m2     GA: Alert, awake, not in acute distress  HEENT: Not pale, no icteric sclerae, no oral thrush  Neck: No cervical lymphadenopathy  Heart: Normal S1, S2, no murmur  Lungs: Clear to auscultation both lungs. No adventitious sounds.   Abdomen: Bowel sound present. Soft, not tender, no guarding. No hepatosplenomegaly. No CVA tendenreness  Ext: No edema  Neuro: A&Ox3. No focal weakness or numbness.   Skin: No rash, no bruises, no thromboembolic phenomenon.  Psych: Appropriate mood and affect.  Line:foley  ??    Laboratory and Tests Review:  Lab Results   Component Value Date    WBC 7.6 06/25/2015    WBC 8.9 06/24/2015    WBC 7.6 06/22/2015    HGB 13.0 06/25/2015    HCT 39.0 06/25/2015    MCV 85.2 06/25/2015    PLT 83 (L) 06/25/2015     Lab Results   Component Value Date    NEUTROABS 6.38 06/25/2015    NEUTROABS 7.57 (H) 06/24/2015    NEUTROABS 7.30  06/22/2015     No results found for: CRP  Lab Results   Component Value Date    SEDRATE 7 02/13/2011    SEDRATE 2 02/27/2010     Lab Results   Component Value Date    ALT 29 06/22/2015    AST 36 06/22/2015    ALKPHOS 103 06/22/2015    BILITOT 4.5 (H) 06/22/2015     Lab Results   Component Value Date    NA 135 06/22/2015    K 3.6 06/22/2015    CL 99 06/22/2015    CO2 23 06/22/2015    BUN 20 06/22/2015    BUN 20 06/21/2009    CREATININE 1.2 06/22/2015    CREATININE 1.1 06/22/2015    CREATININE 1.0 07/14/2014    CREATININE 0.8 06/21/2009    GFRAA >60 06/22/2015    LABGLOM 59 06/22/2015    LABGLOM >60 06/21/2009    GLUCOSE 101 06/22/2015    GLUCOSE 86 06/21/2009    PROT 6.0 06/22/2015    LABALBU 3.6 06/22/2015    CALCIUM 8.8 06/22/2015  BILITOT 4.5 06/22/2015    ALKPHOS 103 06/22/2015    AST 36 06/22/2015    ALT 29 06/22/2015       Radiology:  Reviewed    Microbiology:   Cultures reviewed  Urine Culture [[id number]] (Abnormal) Collected: 06/22/15 2320    ?? Order Status: Completed Specimen: Urine, straight catheter Updated: 06/24/15 1207   ??  Organism Escherichia coli (A)   ??  Urine Culture, Routine --   ??  >100,000 CFU/ml   Sensitivity to follow      ?? Narrative: ??   ?? Source: [facility] ?? ?? ?? Site: ?? ?? ?? ?? ??    ?? Culture Blood #2 [[id number]] (Abnormal) Collected: 06/22/15 2333   ?? Order Status: Completed Specimen: Blood Whole Citrate Updated: 06/24/15 1021   ??  Culture, Blood 2 -- (A)   ??  Gram stain performed from blood culture bottle media   Gram negative rods   Growth in both bottles      ?? Narrative: ??   ?? CALL ??[facility] tel. ,  Microbiology results called to and read back by Archie BalboaJason Akers RN, 06/24/2015  10:21, by Luanne BrasBOYLI   ?? Culture Blood #1 [[id number]] (Abnormal) Collected: 06/22/15 2323   ?? Order Status: Completed Specimen: Blood Whole Citrate Updated: 06/24/15 1016   ??  Blood Culture, Routine -- (A)   ??  Gram stain performed from blood culture bottle media   Gram negative rods   Growth in both bottles      ??  Narrative: ??   ?? CALL ??[facility] tel. ,  Microbiology results called to and read back by Archie BalboaJason Akers RN, 06/24/2015  10:16, by Luanne BrasBOYLI       ??  ASSESSMENT  1. Sepsis post transrectal prostate biopsy, complicated UTI Ecolig  2. GNR bacteremia from above  2. Elevated PSA  ??  RECOMMENDATION  -   merrem 1 gm IV Q8,   - follow cultures, repeat BC  - foley, check residual if remove for possible retention  - uro following  - PICC anticipating IV ABX on DC if afebrile 48h  ??  ??  ??  Shreshta Medley  7:26 PM  06/25/2015

## 2015-06-25 NOTE — Progress Notes (Signed)
Physical Therapy    Facility/Department: [facility]  Initial Assessment    NAME: Julian Castaneda  DOB: Nov 12, 1940  MRN: [id number]    Date of Service: 06/25/2015    Patient Diagnosis(es):   Patient Active Problem List    Diagnosis Date Noted   ??? SIRS (systemic inflammatory response syndrome) (HCC) 06/23/2015   ??? BPH (benign prostatic hyperplasia) 06/23/2015   ??? Chronic pansinusitis 10/03/2014   ??? OSA treated with BiPAP 03/27/2014   ??? Environmental allergies 10/01/2010   ??? Chronic sinusitis 02/25/2010   ??? 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 Asthma 02/25/2010   ??? Elevated PSA 02/25/2010   ??? Environmental allergies 02/25/2010   ??? Environmental allergies 02/25/2010       Past Medical History   Diagnosis Date   ??? Allergy    ??? Asthma      doing well per patient   ??? Chronic pansinusitis 10/03/2014   ??? CSS (Churg-Strauss syndrome) (HCC)      no current issues   ??? Diabetes mellitus (HCC)      patient denies   ??? Elevated PSA      negative biospy   ??? GERD (gastroesophageal reflux disease)    ??? Hyperlipidemia    ??? OSA treated with BiPAP 03/27/2014   ??? Sinusitis    ??? Sleep apnea      [facility] 18/9     Past Surgical History   Procedure Laterality Date   ??? Gastric fundoplication  2005   ??? Septoplasty     ??? Hernia repair     ??? Nasal polyp surgery     ??? Prostate biopsy  2011     negative pathology   ??? Upper gastrointestinal endoscopy  12/2009     with biopsy, pathology negative   ??? Tumor removal  11/2010     fatty tumor removed Dr Julian Castaneda   ??? Endoscopy, colon, diagnostic  01/22/2014       Evaluating Therapist: Shari Castaneda PT    Recommendation for discharge is Home with assistance as needed  Equipment Recommendation non    Room #:446  DIAGNOSIS: fever  Additional Pertinent History:Recent Prostate biopsy  PRECAUTIONS: falls, bed/chair alarm    Social:  Pt lives alone in a 1 floor plan a few steps to enter with rail.  Prior to admission independent and active.  Walks 4 miles a day.  Pt normally provides total care for his  wife, however wife currently in hospice care     Initial Evaluation  Date: 1/10 Treatment      Short Term/ Long Term   Goals   Was pt agreeable to Eval/treatment? yes     Does pt have pain? No, reports generally not feeling well     Bed Mobility  Rolling: independent  Supine to sit: independent  Sit to supine: NT  Scooting: independent  independent   Transfers Sit to stand: supervision  Stand to sit: supervision  Stand pivot: NT  independent   Ambulation    300 feet with no device with CG to SBA, occasional loss of balance to right  400 feet with no device independent   Stair negotiation: ascended and descended NT  4 steps with 1 rail independent   B LE ROM   WFL             B LE strength    4-/5      4/5     Pt is alert and Oriented  Balance: fair without device  Sensation: intact  Edema: none  Endurance: good  Chair alarm: yes     ASSESSMENT  Pt displays functional ability as noted in the objective portion of this evaluation       Examination of body systems Decreased   Functional mobility               X   ROM                   Strength               X   Safety Awareness                   Cognition                   Endurance                   Sensation                   Balance               X   Vision/Visual Deficits                   Coordination                     Clinical presentation     Stable Evolving  Unstable      X                 Decision-Making     Low Medium High      X                 Rehab potential is good for reaching above PT goals.    Pt???s/ family goals   1. To go home    Patient and or family understand(s) diagnosis, prognosis, and plan of care.    PLAN  PT care will be provided in accordance with the objectives noted above.  Whenever appropriate, clear delegation orders will be provided for nursing staff.  Exercises and functional mobility practice will be used as well as appropriate assistive devices or modalities to obtain goals. Patient and family education will also be administered as  needed.    Frequency of treatments will be 2-5x/week x 3-5 days.    Julian Castaneda PT  [phone]

## 2015-06-25 NOTE — Progress Notes (Signed)
06/25/2015 6:42 AM  Service: Urology  Group: [facility] ([employer])    Rana SnareVincent J Ruelas  [id number]    Chief urologic compliant: Fever post bx  HPI:  Patient is doing fair  He has remained with fevers  He remains to feel flat  He has a foley  He does not have pain    Review of Systems:  Respiratory: negative for cough and hemoptysis  Cardiovascular: negative for chest pain and dyspnea  Gastrointestinal: negative for abdominal pain, diarrhea, nausea and vomiting  Derm: negative for rash and skin lesion(s)  Neurological: negative for seizures and tremors  Endocrine: negative for diabetic symptoms including polydipsia and polyuria  GU: As above in the HPI, otherwise negative  All other reviews are negative     Allergies: Enalapril; Esomeprazole; Nexium [esomeprazole magnesium trihydrate]; Nsaids; Ranitidine; and Reglan [metoclopramide hcl]    Objective:   Vitals:    06/25/15 0526   BP: (!) 162/72   Pulse: 82   Resp: 18   Temp: 100.3 ??F (37.9 ??C)   SpO2:        Neuro: A/A/O x3  Respiratory: non labored breathing  ABD: soft non-tender, non-distended  GU: foley clear  Ext: no clubbing, cyanosis, edema    Labs:   Recent Labs      06/22/15   2333  06/24/15   0530  06/25/15   0515   WBC  7.6  8.9  7.6   RBC  4.59  4.44  4.58   HGB  12.9  12.7  13.0   HCT  39.8  38.5  39.0   MCV  86.7  86.7  85.2   MCH  28.1  28.6  28.4   MCHC  32.4  33.0  33.3   RDW  13.9  14.4  14.3   PLT  91*  77*  83*   MPV  11.8  11.8  10.5       Recent Labs      06/22/15   2333   CREATININE  1.2       Assessment: Rana SnareVincent J Rome 75 y.o. male     Sepsis with SIRS post Bx for increased PSA    Plan:    Cultures were reviewed  Antibiotics per ID  Cont observation  Cont IVF    Redge GainerMark A Waldon Sheerin, DO   [facility]

## 2015-06-26 ENCOUNTER — Inpatient Hospital Stay: Admit: 2015-06-26 | Primary: Family Medicine

## 2015-06-26 LAB — BASIC METABOLIC PANEL
Anion Gap: 10 mmol/L (ref 7–16)
BUN: 19 mg/dL (ref 8–23)
CO2: 30 mmol/L — ABNORMAL HIGH (ref 22–29)
Calcium: 9.2 mg/dL (ref 8.6–10.2)
Chloride: 98 mmol/L (ref 98–107)
Creatinine: 1.1 mg/dL (ref 0.7–1.2)
GFR African American: >60
GFR Non-African American: >60 mL/min/{1.73_m2} (ref >=60)
Glucose: 100 mg/dL (ref 74–109)
Potassium: 4.1 mmol/L (ref 3.5–5.0)
Sodium: 138 mmol/L (ref 132–146)

## 2015-06-26 LAB — CULTURE, RESPIRATORY

## 2015-06-26 LAB — CULTURE, BLOOD 2

## 2015-06-26 LAB — CULTURE BLOOD #1

## 2015-06-26 LAB — C-REACTIVE PROTEIN: CRP: 12.5 mg/dL — ABNORMAL HIGH (ref 0.0–0.4)

## 2015-06-26 LAB — SEDIMENTATION RATE: Sed Rate: 60 mm/Hr — ABNORMAL HIGH (ref 0–15)

## 2015-06-26 MED ORDER — HEPARIN SOD (PORK) LOCK FLUSH 100 UNIT/ML IV SOLN
100 UNIT/ML | Freq: Two times a day (BID) | INTRAVENOUS | Status: DC
Start: 2015-06-26 — End: 2015-06-28

## 2015-06-26 MED ORDER — CEFTRIAXONE SODIUM 2 G IJ SOLR
2 g | INTRAMUSCULAR | Status: DC
Start: 2015-06-26 — End: 2015-06-28
  Administered 2015-06-26 – 2015-06-28 (×3): via INTRAVENOUS

## 2015-06-26 MED ORDER — GUAIFENESIN 400 MG PO TABS
400 MG | Freq: Four times a day (QID) | ORAL | Status: DC | PRN
Start: 2015-06-26 — End: 2015-06-26

## 2015-06-26 MED ORDER — IOVERSOL 68 % IV SOLN
68 % | Freq: Once | INTRAVENOUS | Status: AC | PRN
Start: 2015-06-26 — End: 2015-06-26
  Administered 2015-06-26: 23:00:00 via INTRAVENOUS

## 2015-06-26 MED ORDER — CEFTRIAXONE SODIUM 1 G IJ SOLR
1 g | INTRAMUSCULAR | 0 refills | Status: AC
Start: 2015-06-26 — End: 2015-07-16

## 2015-06-26 MED ORDER — FLUTICASONE PROPIONATE 50 MCG/ACT NA SUSP
50 MCG/ACT | Freq: Every day | NASAL | Status: DC
Start: 2015-06-26 — End: 2015-06-28
  Administered 2015-06-26 – 2015-06-28 (×3): via NASAL

## 2015-06-26 MED ORDER — HEPARIN SOD (PORK) LOCK FLUSH 100 UNIT/ML IV SOLN
100 UNIT/ML | INTRAVENOUS | Status: DC | PRN
Start: 2015-06-26 — End: 2015-06-28
  Administered 2015-06-28: 18:00:00

## 2015-06-26 MED ORDER — LIDOCAINE HCL (PF) 1 % IJ SOLN
1 % | Freq: Once | INTRAMUSCULAR | Status: AC
Start: 2015-06-26 — End: 2015-06-26
  Administered 2015-06-26: 19:00:00 via INTRADERMAL

## 2015-06-26 MED ORDER — GUAIFENESIN 400 MG PO TABS
400 MG | Freq: Four times a day (QID) | ORAL | Status: DC
Start: 2015-06-26 — End: 2015-06-28
  Administered 2015-06-26 – 2015-06-28 (×8): via ORAL

## 2015-06-26 MED ORDER — CHLORPHENIRAMINE MALEATE 4 MG PO TABS
4 MG | Freq: Three times a day (TID) | ORAL | Status: DC
Start: 2015-06-26 — End: 2015-06-28
  Administered 2015-06-26 – 2015-06-28 (×6): via ORAL

## 2015-06-26 MED ORDER — NORMAL SALINE FLUSH 0.9 % IV SOLN
0.9 % | INTRAVENOUS | Status: DC | PRN
Start: 2015-06-26 — End: 2015-06-28
  Administered 2015-06-26 – 2015-06-28 (×7): via INTRAVENOUS

## 2015-06-26 MED FILL — POTASSIUM CHLORIDE IN NACL 20-0.9 MEQ/L-% IV SOLN: INTRAVENOUS | Qty: 1000

## 2015-06-26 MED FILL — CEFTRIAXONE SODIUM 2 G IJ SOLR: 2 g | INTRAMUSCULAR | Qty: 2

## 2015-06-26 MED FILL — DOCUSATE SODIUM 100 MG PO CAPS: 100 MG | ORAL | Qty: 1

## 2015-06-26 MED FILL — PREVALITE 4 G PO PACK: 4 g | ORAL | Qty: 1

## 2015-06-26 MED FILL — MAPAP 325 MG PO TABS: 325 MG | ORAL | Qty: 2

## 2015-06-26 MED FILL — CHLORPHENIRAMINE MALEATE 4 MG PO TABS: 4 MG | ORAL | Qty: 1

## 2015-06-26 MED FILL — PROCHLORPERAZINE EDISYLATE 5 MG/ML IJ SOLN: 5 MG/ML | INTRAMUSCULAR | Qty: 2

## 2015-06-26 MED FILL — MUCUS RELIEF 400 MG PO TABS: 400 MG | ORAL | Qty: 1

## 2015-06-26 MED FILL — LIPITOR 40 MG PO TABS: 40 MG | ORAL | Qty: 2

## 2015-06-26 MED FILL — HYDROCHLOROTHIAZIDE 25 MG PO TABS: 25 MG | ORAL | Qty: 1

## 2015-06-26 MED FILL — AMLODIPINE BESYLATE 2.5 MG PO TABS: 2.5 MG | ORAL | Qty: 1

## 2015-06-26 MED FILL — FLUTICASONE PROPIONATE 50 MCG/ACT NA SUSP: 50 MCG/ACT | NASAL | Qty: 16

## 2015-06-26 MED FILL — MONOJECT FLUSH SYRINGE 0.9 % IV SOLN: 0.9 % | INTRAVENOUS | Qty: 30

## 2015-06-26 MED FILL — MONOJECT FLUSH SYRINGE 0.9 % IV SOLN: 0.9 % | INTRAVENOUS | Qty: 20

## 2015-06-26 MED FILL — MONOJECT FLUSH SYRINGE 0.9 % IV SOLN: 0.9 % | INTRAVENOUS | Qty: 10

## 2015-06-26 NOTE — Progress Notes (Signed)
[facility]  [facility]  Progress Note    Chief Complaint   Patient presents with   ??? Post-op Problem     prostate biopsy 2 days ago, here with same symptoms yesterday       SUBJECTIVE:    Afebrile, no leukocytosis  Patient is tolerating medications. No reported adverse drug reactions.  No nausea, vomiting, diarrhea.    Medications:  Scheduled Meds:  ??? cefTRIAXone (ROCEPHIN) IV  2 g Intravenous Q24H   ??? fluticasone  1 spray Each Nare Daily   ??? guaiFENesin  400 mg Oral 4x Daily   ??? lidocaine PF  5 mL Intradermal Once   ??? heparin flush  3 mL Intravenous 2 times per day   ??? amLODIPine  2.5 mg Oral Nightly   ??? atorvastatin  80 mg Oral Daily   ??? cholestyramine light  4 g Oral Daily   ??? hydrochlorothiazide  25 mg Oral Daily   ??? docusate sodium  100 mg Oral BID     Continuous Infusions:  ??? 0.9% NaCl with KCl 20 mEq 75 mL/hr at 06/26/15 0945     PRN Meds:sodium chloride flush, heparin flush, ondansetron, traMADol, acetaminophen, meperidine, prochlorperazine    Allergy reviewed:  Enalapril; Esomeprazole; Nexium [esomeprazole magnesium trihydrate]; Nsaids; Ranitidine; and Reglan [metoclopramide hcl]    OBJECTIVE:  Visit Vitals   ??? BP (!) 162/76   ??? Pulse 86   ??? Temp 99.2 ??F (37.3 ??C) (Oral)   ??? Resp 18   ??? Ht 5\' 9"  (1.753 m)   ??? Wt 203 lb 1.6 oz (92.1 kg)   ??? SpO2 94%   ??? BMI 29.99 kg/m2     GA: Alert, awake, not in acute distress  HEENT: Not pale, no icteric sclerae, no oral thrush  Neck: No cervical lymphadenopathy  Heart: Normal S1, S2, no murmur  Lungs: Clear to auscultation both lungs. No adventitious sounds.   Abdomen: Bowel sound present. Soft, not tender, no guarding. No hepatosplenomegaly. No CVA tendenreness  Ext: No edema  Neuro: A&Ox3. No focal weakness or numbness.   Skin: No rash, no bruises, no thromboembolic phenomenon.  Psych: Appropriate mood and affect.  Line:foley  ??    Laboratory and Tests Review:  Lab Results   Component Value Date    WBC 7.6 06/25/2015    WBC 8.9 06/24/2015     WBC 7.6 06/22/2015    HGB 13.0 06/25/2015    HCT 39.0 06/25/2015    MCV 85.2 06/25/2015    PLT 83 (L) 06/25/2015     Lab Results   Component Value Date    NEUTROABS 6.38 06/25/2015    NEUTROABS 7.57 (H) 06/24/2015    NEUTROABS 7.30 06/22/2015     Lab Results   Component Value Date    CRP 12.5 (H) 06/26/2015     Lab Results   Component Value Date    SEDRATE 60 (H) 06/26/2015    SEDRATE 7 02/13/2011    SEDRATE 2 02/27/2010     Lab Results   Component Value Date    ALT 29 06/22/2015    AST 36 06/22/2015    ALKPHOS 103 06/22/2015    BILITOT 4.5 (H) 06/22/2015     Lab Results   Component Value Date    NA 138 06/26/2015    K 4.1 06/26/2015    CL 98 06/26/2015    CO2 30 06/26/2015    BUN 19 06/26/2015    BUN 20 06/21/2009  CREATININE 1.1 06/26/2015    CREATININE 1.2 06/22/2015    CREATININE 1.1 06/22/2015    CREATININE 0.8 06/21/2009    GFRAA >60 06/26/2015    LABGLOM >60 06/26/2015    LABGLOM >60 06/21/2009    GLUCOSE 100 06/26/2015    GLUCOSE 86 06/21/2009    PROT 6.0 06/22/2015    LABALBU 3.6 06/22/2015    CALCIUM 9.2 06/26/2015    BILITOT 4.5 06/22/2015    ALKPHOS 103 06/22/2015    AST 36 06/22/2015    ALT 29 06/22/2015       Radiology:  Reviewed    Microbiology:   Cultures reviewed  Urine Culture [[id number]] (Abnormal) Collected: 06/22/15 2320    ?? Order Status: Completed Specimen: Urine, straight catheter Updated: 06/24/15 1207   ??  Organism Escherichia coli (A)   ??  Urine Culture, Routine --   ??  >100,000 CFU/ml   Sensitivity to follow      ?? Narrative: ??   ?? Source: [facility] ?? ?? ?? Site: ?? ?? ?? ?? ??    ?? Culture Blood #2 [[id number]] (Abnormal) Collected: 06/22/15 2333   ?? Order Status: Completed Specimen: Blood Whole Citrate Updated: 06/24/15 1021   ??  Culture, Blood 2 -- (A)   ??  Gram stain performed from blood culture bottle media   Gram negative rods   Growth in both bottles      ?? Narrative: ??   ?? CALL ??[facility] tel. ,  Microbiology results called to and read back by Archie Balboa RN, 06/24/2015  10:21, by Luanne Bras    ?? Culture Blood #1 [[id number]] (Abnormal) Collected: 06/22/15 2323   ?? Order Status: Completed Specimen: Blood Whole Citrate Updated: 06/24/15 1016   ??  Blood Culture, Routine -- (A)   ??  Gram stain performed from blood culture bottle media   Gram negative rods   Growth in both bottles      ?? Narrative: ??   ?? CALL [facility] tel. ,  Microbiology results called to and read back by Archie Balboa RN, 06/24/2015  10:16, by Luanne Bras     Blood Culture, Routine (Abnormal)    ?? Growth in both bottles   ?? Organism (Abnormal)   ?? Escherichia coli   ??   Narrative    ?? Source: BLOOD ?? ?? ?? Site: ?? ?? ?? ?? ?? Microbiology results called to and read back by Archie Balboa RN, 06/24/2015  10:16, by Luanne Bras   ??   Culture & Susceptibility    ?? ESCHERICHIA COLI    ?? Antibiotic Interpretation MIC Unit   ?? Confirmatory Extended Spectrum Beta-Lactamase  Neg mcg/mL   ?? amoxicillin-clavulanate Intermediate =^16 mcg/mL   ?? ampicillin Resistant >=^32 mcg/mL   ?? ceFAZolin Sensitive <=^4 mcg/mL   ?? cefTRIAXone Sensitive <=^1 mcg/mL   ?? cefepime Sensitive <=^1 mcg/mL   ?? gentamicin Resistant >=^16 mcg/mL   ?? imipenem Sensitive <=^0.25 mcg/mL   ?? levofloxacin Resistant >=^8 mcg/mL   ?? piperacillin-tazobactam Sensitive <=^4 mcg/mL   ?? tobramycin Intermediate =^8 mcg/mL   ?? trimethoprim-sulfamethoxazole Resistant >=^320 mcg/mL   ??    ??            ??  ASSESSMENT  1. Sepsis post transrectal prostate biopsy, complicated UTI Ecoli   2. Ecoli bacteremia from above  3. Elevated PSA  4. sinusitis  ??  RECOMMENDATION  - change merrem to rocephin 2 gm daily   - follow cultures, repeat BC  -  foley, check residual if remove for possible retention  - uro following  - PICC anticipating IV ABX on DC if afebrile 48h  ??CT abd pelvis with still persisting r/o perirectal/prostate abscess  ??  ??  Merinda Victorino  11:33 AM  06/26/2015

## 2015-06-26 NOTE — Progress Notes (Signed)
06/26/2015 10:25 AM  Service: Urology  Group: [facility] (Anayelli Lai/[facility])    Julian Castaneda  [id number]    Chief urologic compliant: Increased PSA  HPI:  Patient is doing better  He has no pain  He was voiding well  His BM's are regular  His son is present  The pathology was reviewed  Education was given  All options were discussed  He remains to feel flat  He has been ambulating    Review of Systems:  Respiratory: negative for cough and hemoptysis  Cardiovascular: negative for chest pain and dyspnea  Gastrointestinal: negative for abdominal pain, diarrhea, nausea and vomiting  Derm: negative for rash and skin lesion(s)  Neurological: negative for seizures and tremors  Endocrine: negative for diabetic symptoms including polydipsia and polyuria  GU: As above in the HPI, otherwise negative  All other reviews are negative     Allergies: Enalapril; Esomeprazole; Nexium [esomeprazole magnesium trihydrate]; Nsaids; Ranitidine; and Reglan [metoclopramide hcl]    Objective:   Vitals:    06/26/15 0758   BP: (!) 162/76   Pulse: 86   Resp: 18   Temp: 99.2 ??F (37.3 ??C)   SpO2: 94%       Neuro: A/A/O x3  Respiratory: non labored breathing  ABD: soft non-tender, non-distended  GU: foley clear  Ext: no clubbing, cyanosis, edema    Labs:   Recent Labs      06/24/15   0530  06/25/15   0515   WBC  8.9  7.6   RBC  4.44  4.58   HGB  12.7  13.0   HCT  38.5  39.0   MCV  86.7  85.2   MCH  28.6  28.4   MCHC  33.0  33.3   RDW  14.4  14.3   PLT  77*  83*   MPV  11.8  10.5       Recent Labs      06/26/15   0454   CREATININE  1.1     Diagnosis:  ???? A. Prostate, needle biopsy, left anterior: Invasive adenocarcinoma,  ???? ?? Gleason patterns 3+3 =6, tumor involves 5 of 5 cores, 70% of tissue  ???? ?? and measures 16 mm  ???? B. Prostate, needle biopsy, left apex: Benign prostatic tissue  ???? C. Prostate, needle biopsy, left mid: Benign prostatic tissue  ???? D. Prostate, needle biopsy, left base: Benign prostatic tissue  ???? E. Prostate, needle  biopsy, right apex: Benign prostatic tissue  ???? F. Prostate, needle biopsy, right mid: Benign prostatic tissue  ???? G. Prostate, needle biopsy, right base: Benign prostatic tissue  Antibiotic Interpretation MIC Unit    ?? Confirmatory Extended Spectrum Beta-Lactamase  Neg mcg/mL   ?? amoxicillin-clavulanate Intermediate =^16 mcg/mL   ?? ampicillin Resistant >=^32 mcg/mL   ?? ceFAZolin Sensitive <=^4 mcg/mL   ?? cefTRIAXone Sensitive <=^1 mcg/mL   ?? cefepime Sensitive <=^1 mcg/mL   ?? gentamicin Resistant >=^16 mcg/mL   ?? imipenem Sensitive <=^0.25 mcg/mL   ?? levofloxacin Resistant >=^8 mcg/mL   ?? nitrofurantoin Sensitive =^32 mcg/mL   ?? piperacillin-tazobactam Sensitive <=^4 mcg/mL   ?? tobramycin Intermediate =^8 mcg/mL   ?? trimethoprim-sulfamethoxazole Resistant >=^320 mcg/mL         Assessment: Julian Castaneda 75 y.o. male     BPH  Prostate cancer  Increased PSA  Bacteremia post bx with SIRS    Plan:    Pathology was reviewed  This was discussed  DC the  foley  His son is present  Cont the antibiotics per ID  Positive blood and urine C&S  Will plan for DC on Friday      Redge GainerMark A Danniel Tones, DO   [facility]

## 2015-06-26 NOTE — Plan of Care (Signed)
Problem: Falls - Risk of  Goal: Absence of falls  Outcome: Met This Shift    Problem: Gas Exchange - Impaired:  Goal: Levels of oxygenation will improve  Levels of oxygenation will improve   Outcome: Met This Shift

## 2015-06-26 NOTE — Progress Notes (Signed)
Met with patient in his hospital room he and family feel he will not discharge until Friday plan is patient will come to [place] day after discharge. Instructed patient on where to come and plan Saturday at [place] and if he goes home before that we will give him a time for Friday. Obtained signed script.

## 2015-06-26 NOTE — Progress Notes (Signed)
Ambulated patient around whole nurses station. Patient tolerated ambulating well.

## 2015-06-26 NOTE — Progress Notes (Signed)
Patient's IV site infiltrated overnight. Patient without IV site at current moment. Scheduled for PICC placement with the IV team today. Helen with the IV team notified that patient is without IV site. Patient is on their list for a PICC placement today.

## 2015-06-26 NOTE — Care Coordination-Inpatient (Addendum)
Social Work/Discharge Planning:  Met with patient and his son Julian Castaneda and completed initial assessment.  Explained Social Work role and discussed discharge planning.  Patient lives with his wife in a one story house.  PTA patient independent with no adaptive device.  Patient has a c-pap and shower chair at home.  Patient denies any history with hhc or snf.  Patient PCP is Dr. Jiles Castaneda and pharmacy is [facility] in [place].  Discussed patient need for IV antibiotic and he states he prefers to go to [facility].  Referral made to Diane at [facility] [phone]).  Patient will need script for IV antibiotic.  Will continue to follow and assist with discharge planning.  Electronically signed by Sharene Butters, LSW on 06/26/2015 at 11:28 AM    Addendum:  Shauna Hugh from [facility] stated they can accept patient and they have script for IV antibiotic.  Patient will need IV antibiotic administered prior to discharge.  Will need to notify [place] of patient discharge.  Will continue to follow.  Electronically signed by Sharene Butters, LSW on 06/26/2015 at 3:24 PM

## 2015-06-26 NOTE — Progress Notes (Addendum)
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 She will follow the patient post procedure for any sign of vasculitis and she reiterated the need for PICC placement at this time.

## 2015-06-26 NOTE — Progress Notes (Signed)
24 hr chart check complete  Electronically signed by Bethanne GingerJanice Samiya Mervin, RN on 06/26/2015 at 6:20 AM

## 2015-06-26 NOTE — Progress Notes (Signed)
Foley catheter removed. Patient due to void by 2230.

## 2015-06-26 NOTE — Procedures (Addendum)
PICC  Catheter insertion date 06/26/2015     Product Number:  [id number]   Lot No: [id number]   Gauge: 4 fr   Lumen: single   R Basilic    Vein Diameter : 0.70 cm   Upper arm circumference: 28 cm at insertion site   Catheter Length : 50 cm   Internal Length: 46 cm   Exposed Catheter Length: 4 cm   Ultrasound Used:yes   Inserted by H. Danelle Earthlyoel RN  06/26/15 1519 CHEST XRAY  REPORTS PICC TIP IS IN THE SVC.  FLOOR NURSE NOTIFIED.    J Merced Brougham RN

## 2015-06-26 NOTE — Progress Notes (Signed)
Chart check completed from 0700-1900.

## 2015-06-27 MED ORDER — POLYETHYLENE GLYCOL 3350 17 G PO PACK
17 g | Freq: Every day | ORAL | Status: DC
Start: 2015-06-27 — End: 2015-06-28
  Administered 2015-06-27 – 2015-06-28 (×2): via ORAL

## 2015-06-27 MED ORDER — MICONAZOLE NITRATE 2 % EX OINT
2 % | Freq: Two times a day (BID) | CUTANEOUS | Status: DC | PRN
Start: 2015-06-27 — End: 2015-06-28

## 2015-06-27 MED ORDER — BISACODYL 10 MG RE SUPP
10 MG | Freq: Once | RECTAL | Status: AC
Start: 2015-06-27 — End: 2015-06-27
  Administered 2015-06-27: 20:00:00 via RECTAL

## 2015-06-27 MED FILL — CHLORPHENIRAMINE MALEATE 4 MG PO TABS: 4 MG | ORAL | Qty: 1

## 2015-06-27 MED FILL — MUCUS RELIEF 400 MG PO TABS: 400 MG | ORAL | Qty: 1

## 2015-06-27 MED FILL — PREVALITE 4 G PO PACK: 4 g | ORAL | Qty: 1

## 2015-06-27 MED FILL — PROCHLORPERAZINE EDISYLATE 5 MG/ML IJ SOLN: 5 MG/ML | INTRAMUSCULAR | Qty: 2

## 2015-06-27 MED FILL — DOCUSATE SODIUM 100 MG PO CAPS: 100 MG | ORAL | Qty: 1

## 2015-06-27 MED FILL — ALOE VESTA ANTIFUNGAL 2 % EX OINT: 2 % | CUTANEOUS | Qty: 56

## 2015-06-27 MED FILL — HYDROCHLOROTHIAZIDE 25 MG PO TABS: 25 MG | ORAL | Qty: 1

## 2015-06-27 MED FILL — AMLODIPINE BESYLATE 2.5 MG PO TABS: 2.5 MG | ORAL | Qty: 1

## 2015-06-27 MED FILL — POTASSIUM CHLORIDE IN NACL 20-0.9 MEQ/L-% IV SOLN: INTRAVENOUS | Qty: 1000

## 2015-06-27 MED FILL — CEFTRIAXONE SODIUM 2 G IJ SOLR: 2 g | INTRAMUSCULAR | Qty: 2

## 2015-06-27 MED FILL — POLYETHYLENE GLYCOL 3350 17 G PO PACK: 17 g | ORAL | Qty: 1

## 2015-06-27 MED FILL — ONDANSETRON HCL 4 MG PO TABS: 4 MG | ORAL | Qty: 1

## 2015-06-27 MED FILL — BISAC-EVAC 10 MG RE SUPP: 10 MG | RECTAL | Qty: 1

## 2015-06-27 MED FILL — MONOJECT FLUSH SYRINGE 0.9 % IV SOLN: 0.9 % | INTRAVENOUS | Qty: 10

## 2015-06-27 MED FILL — HEPARIN LOCK FLUSH 100 UNIT/ML IV SOLN: 100 UNIT/ML | INTRAVENOUS | Qty: 5

## 2015-06-27 MED FILL — LIPITOR 40 MG PO TABS: 40 MG | ORAL | Qty: 2

## 2015-06-27 NOTE — Progress Notes (Signed)
[facility]  PROGRESS NOTE    Chief Complaint:  PCA/Sepsis after prostate biopsy/BPH  HPI:  Feeling and voiding much better now.  No hematuria or dysuria.  Going home tomorrow he states    Vitals:    06/27/15 0713   BP: 138/69   Pulse: 75   Resp: 18   Temp: 98.4 ??F (36.9 ??C)   SpO2:        Allergies: Enalapril; Esomeprazole; Nexium [esomeprazole magnesium trihydrate]; Nsaids; Ranitidine; and Reglan [metoclopramide hcl]    PAST MEDICAL HISTORY:   Past Medical History   Diagnosis Date   ??? Allergy    ??? Asthma      doing well per patient   ??? Chronic pansinusitis 10/03/2014   ??? CSS (Churg-Strauss syndrome) (HCC)      no current issues   ??? Diabetes mellitus (HCC)      patient denies   ??? Elevated PSA      negative biospy   ??? GERD (gastroesophageal reflux disease)    ??? Hyperlipidemia    ??? OSA treated with BiPAP 03/27/2014   ??? Sinusitis    ??? Sleep apnea      CHM 18/9       PAST SURGICAL HISTORY:   Past Surgical History   Procedure Laterality Date   ??? Gastric fundoplication  2005   ??? Septoplasty     ??? Hernia repair     ??? Nasal polyp surgery     ??? Prostate biopsy  2011     negative pathology   ??? Upper gastrointestinal endoscopy  12/2009     with biopsy, pathology negative   ??? Tumor removal  11/2010     fatty tumor removed Dr Lavone NianAwad   ??? Endoscopy, colon, diagnostic  01/22/2014        PAST FAMILY HISTORY:    Family History   Problem Relation Age of Onset   ??? High Blood Pressure Father    ??? High Cholesterol Father    ??? Asthma Sister    ??? Diabetes Sister        PAST SOCIAL HISTORY:    Social History     Social History   ??? Marital status: Married     Spouse name: N/A   ??? Number of children: N/A   ??? Years of education: N/A     Social History Main Topics   ??? Smoking status: Never Smoker   ??? Smokeless tobacco: Never Used   ??? Alcohol use No   ??? Drug use: No   ??? Sexual activity: Yes     Other Topics Concern   ??? None     Social History Narrative       IV:   ??? 0.9% NaCl with KCl 20 mEq 75 mL/hr at 06/27/15 0023       PRN: sodium chloride flush,  heparin flush, ondansetron, traMADol, acetaminophen, meperidine, prochlorperazine    Scheduled:   ??? polyethylene glycol  17 g Oral Daily   ??? cefTRIAXone (ROCEPHIN) IV  2 g Intravenous Q24H   ??? fluticasone  1 spray Each Nare Daily   ??? guaiFENesin  400 mg Oral 4x Daily   ??? chlorpheniramine  4 mg Oral TID   ??? heparin flush  3 mL Intravenous 2 times per day   ??? amLODIPine  2.5 mg Oral Nightly   ??? atorvastatin  80 mg Oral Daily   ??? cholestyramine light  4 g Oral Daily   ??? hydrochlorothiazide  25 mg Oral Daily   ???  docusate sodium  100 mg Oral BID       Lab Results   Component Value Date    NA 138 06/26/2015    K 4.1 06/26/2015    BUN 19 06/26/2015    BUN 20 06/21/2009    CREATININE 1.1 06/26/2015    CREATININE 0.8 06/21/2009        Lab Results   Component Value Date    HGB 13.0 06/25/2015    HGB 14.7 06/21/2009    HCT 39.0 06/25/2015    HCT 43.8 06/21/2009       Constitutional: tired  Eyes: negative  Ears, nose, mouth, throat, and face: negative  Respiratory: asthma  Cardiovascular: negative  Gastrointestinal: gerd  Genitourinary: pca  Musculoskeletal: negative  Neurological: negative  Behavioral/Psych: negative  Endocrine: diabetes    Skin dry, without rashes  Respirations non-labored, intact  Abdomen soft, non-tender, non-distended.  Active bowel sounds.  Alert and oriented x3    Assessment and Plan:  PCA/Sepsis after prostate biopsy/BPH  -Pathology report discussed in detail with patient.  Will refer to radiation oncology as outpatient to discuss prostate radiation  -Finish antibiotics per ID  -Discharge home tomorrow  -F/U as outpatient        Sandi Carne, MD  06/27/2015  9:46 AM

## 2015-06-27 NOTE — Progress Notes (Signed)
Physical Therapy  Facility/Department: [facility] INTERMEDIATE [facility]  Daily Treatment Note  NAME: Julian Castaneda  DOB: Apr 05, 1941  MRN: [id number]    Date of Service: 06/27/2015    Patient Diagnosis(es):   Patient Active Problem List    Diagnosis Date Noted   ??? SIRS (systemic inflammatory response syndrome) (HCC) 06/23/2015   ??? BPH (benign prostatic hyperplasia) 06/23/2015   ??? Chronic pansinusitis 10/03/2014   ??? OSA treated with BiPAP 03/27/2014   ??? Environmental allergies 10/01/2010   ??? Chronic sinusitis 02/25/2010   ??? 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 Asthma 02/25/2010   ??? Elevated PSA 02/25/2010   ??? Environmental allergies 02/25/2010   ??? Environmental allergies 02/25/2010       Past Medical History   Diagnosis Date   ??? Allergy    ??? Asthma      doing well per patient   ??? Chronic pansinusitis 10/03/2014   ??? CSS (Churg-Strauss syndrome) (HCC)      no current issues   ??? Diabetes mellitus (HCC)      patient denies   ??? Elevated PSA      negative biospy   ??? GERD (gastroesophageal reflux disease)    ??? Hyperlipidemia    ??? OSA treated with BiPAP 03/27/2014   ??? Sinusitis    ??? Sleep apnea      CHM 18/9     Past Surgical History   Procedure Laterality Date   ??? Gastric fundoplication  2005   ??? Septoplasty     ??? Hernia repair     ??? Nasal polyp surgery     ??? Prostate biopsy  2011     negative pathology   ??? Upper gastrointestinal endoscopy  12/2009     with biopsy, pathology negative   ??? Tumor removal  11/2010     fatty tumor removed Dr Lavone NianAwad   ??? Endoscopy, colon, diagnostic  01/22/2014     Evaluating Therapist: Shari Prowsarla Zeth Buday PT  ??  Recommendation for discharge is Home with assistance as needed  Equipment Recommendation none  ??  Room #:446  DIAGNOSIS: fever  Additional Pertinent History:Recent Prostate biopsy  PRECAUTIONS: falls, bed/chair alarm  ??  Social: Pt lives alone in a 1 floor plan a few steps to enter with rail.  Prior to admission independent and active. Walks 4 miles a day. Pt normally provides total care for his  wife, however wife currently in hospice care  ??  ?? Initial Evaluation  Date: 1/10 Treatment  1/12???? Short Term/ Long Term  Goals   Was pt agreeable to Eval/treatment? yes yes?? ??   Does pt have pain? No, reports generally not feeling well ??no c/o pain ??   Bed Mobility  Rolling: independent  Supine to sit: independent  Sit to supine: NT  Scooting: independent ??  independent independent   Transfers Sit to stand: supervision  Stand to sit: supervision  Stand pivot: NT ?? independent independent   Ambulation  300 feet with no device with CG to SBA, occasional loss of balance to right  300 feet with no device CG/SB.  Pt with initial loss of balance when beginning to ambulate. Balance improved at pt continued to walk 400 feet with no device independent   Stair negotiation: ascended and descended NT  NT 4 steps with 1 rail independent   B LE ROM WFL ?? ????   B LE strength 4-/5 ?? 4/5     Pt is making good progress toward established  Physical Therapy goals.  Continue with physical therapy current plan of care.    [place] PT [phone]

## 2015-06-27 NOTE — Progress Notes (Signed)
Julian Castaneda.                                          [facility]                                                           PROGRESS NOTE                                                                       06/27/2015        CHIEF UROLOGIC COMPLAINT: Sepsis following MRI fusion prostate Bx, Prostate Cancer     HISTORY OF PRESENT ILLNESS:  Patient without new complaints. Starting to feel better.  Still constipated.     REVIEW OF SYSTEMS:   CONSTITUTIONAL: negative  HEENT: negative  HEMATOLOGIC: negative  ENDOCRINE: negative  RESPIRATORY: negative  CV: negative  GI: negative  NEURO: negative  ORTHOPEDICS: negative  PSYCHIATRIC: negative  GU: as above    PAST FAMILY HISTORY:    Family History   Problem Relation Age of Onset   ??? High Blood Pressure Father    ??? High Cholesterol Father    ??? Asthma Sister    ??? Diabetes Sister      PAST SOCIAL HISTORY:    Social History     Social History   ??? Marital status: Married     Spouse name: N/A   ??? Number of children: N/A   ??? Years of education: N/A     Social History Main Topics   ??? Smoking status: Never Smoker   ??? Smokeless tobacco: Never Used   ??? Alcohol use No   ??? Drug use: No   ??? Sexual activity: Yes     Other Topics Concern   ??? None     Social History Narrative       Scheduled Meds:  ??? cefTRIAXone (ROCEPHIN) IV  2 g Intravenous Q24H   ??? fluticasone  1 spray Each Nare Daily   ??? guaiFENesin  400 mg Oral 4x Daily   ??? chlorpheniramine  4 mg Oral TID   ??? heparin flush  3 mL Intravenous 2 times per day   ??? amLODIPine  2.5 mg Oral Nightly   ??? atorvastatin  80 mg Oral Daily   ??? cholestyramine light  4 g Oral Daily   ??? hydrochlorothiazide  25 mg Oral Daily   ??? docusate sodium  100 mg Oral BID     Continuous Infusions:  ??? 0.9% NaCl with KCl 20 mEq 75 mL/hr at 06/27/15 0023     PRN Meds:.sodium chloride flush, heparin flush, ondansetron, traMADol, acetaminophen, meperidine, prochlorperazine    Visit Vitals   ??? BP 139/65   ??? Pulse 80   ??? Temp 97.5 ??F (36.4 ??C) (Oral)   ??? Resp 16    ??? Ht 5\' 9"  (1.753 m)   ??? Wt 198 lb (89.8 kg)   ??? SpO2 93%   ???  BMI 29.24 kg/m2       Lab Results   Component Value Date    WBC 7.6 06/25/2015    HGB 13.0 06/25/2015    HCT 39.0 06/25/2015    MCV 85.2 06/25/2015    PLT 83 (L) 06/25/2015       Lab Results   Component Value Date    CREATININE 1.1 06/26/2015       No results found for: PSA        PHYSICAL EXAMINATION:  Skin dry, without rashes  Respirations non-labored, intact  Abdomen soft, non-tender, DISTENDED  Alert and oriented x3      Diagnosis:  ???? A. Prostate, needle biopsy, left anterior: Invasive adenocarcinoma,  ???? ?? Gleason patterns 3+3 =6, tumor involves 5 of 5 cores, 70% of tissue  ???? ?? and measures 16 mm  ???? B. Prostate, needle biopsy, left apex: Benign prostatic tissue  ???? C. Prostate, needle biopsy, left mid: Benign prostatic tissue  ???? D. Prostate, needle biopsy, left base: Benign prostatic tissue  ???? E. Prostate, needle biopsy, right apex: Benign prostatic tissue  ???? F. Prostate, needle biopsy, right mid: Benign prostatic tissue  ???? G. Prostate, needle biopsy, right base: Benign prostatic tissue  Antibiotic Interpretation MIC Unit ??   ???? Confirmatory Extended Spectrum Beta-Lactamase ?? Neg mcg/mL   ???? amoxicillin-clavulanate Intermediate =^16 mcg/mL   ???? ampicillin Resistant >=^32 mcg/mL   ???? ceFAZolin Sensitive <=^4 mcg/mL   ???? cefTRIAXone Sensitive <=^1 mcg/mL   ???? cefepime Sensitive <=^1 mcg/mL   ???? gentamicin Resistant >=^16 mcg/mL   ???? imipenem Sensitive <=^0.25 mcg/mL   ???? levofloxacin Resistant >=^8 mcg/mL   ???? nitrofurantoin Sensitive =^32 mcg/mL   ???? piperacillin-tazobactam Sensitive <=^4 mcg/mL   ???? tobramycin Intermediate =^8 mcg/mL   ???? trimethoprim-sulfamethoxazole Resistant >=^320 mcg/mL   ??  ?? Impression: ??   ?? 1. Prominent 6.5 cm prostate gland produces an IMPRESSION on the base  of the bladder. The prostate gland otherwise unremarkable. No sign of  hematoma or abscess formation noted.  2. Diverticulosis of diverticulitis.  3. Small 7  mm probable cyst of the right kidney. Old studies not  available for comparison. As clinically appropriate the anatomy might  be further clarified using ultrasound.    4. Coronary artery calcification.         ASSESSMENT AND PLAN:  1. Sepsis following prostate bx with resistance to Cipro and Gentamicin  -ID following  -home on IV rocephin    2. Prostate cancer identified of MRI fusion biopsy of lesion  -he will discuss all options with Dr. Gus Height    3. UA retention  -will check PVR to ensure he is continuing to empty    4. Constipation  -will give dulcolax supp and miralax  -if no BM by this afternoon will give tap water enema    Plan for d/c tomorrow?      Electronically signed by Gaynelle Cage, MD on 06/27/2015 at 6:22 AM

## 2015-06-27 NOTE — Plan of Care (Signed)
Problem: Falls - Risk of  Goal: Absence of falls  Outcome: Met This Shift    Problem: Gas Exchange - Impaired:  Goal: Levels of oxygenation will improve  Levels of oxygenation will improve   Outcome: Met This Shift    Problem: Infection, Septic Shock:  Goal: Will show no infection signs and symptoms  Will show no infection signs and symptoms   Outcome: Met This Shift    Problem: Tissue Perfusion, Altered:  Goal: Circulatory function within specified parameters  Circulatory function within specified parameters   Outcome: Met This Shift    Problem: Venous Thromboembolism:  Goal: Will show no signs or symptoms of venous thromboembolism  Will show no signs or symptoms of venous thromboembolism   Outcome: Met This Shift

## 2015-06-27 NOTE — Progress Notes (Signed)
24 hour chart check complete.

## 2015-06-27 NOTE — Progress Notes (Signed)
[facility]  [facility]  Progress Note    Chief Complaint   Patient presents with   ??? Post-op Problem     prostate biopsy 2 days ago, here with same symptoms yesterday       SUBJECTIVE:    Afebrile, no leukocytosis  Patient is tolerating medications. No reported adverse drug reactions.  No nausea, vomiting, diarrhea.    Medications:  Scheduled Meds:  ??? bisacodyl  10 mg Rectal Once   ??? polyethylene glycol  17 g Oral Daily   ??? cefTRIAXone (ROCEPHIN) IV  2 g Intravenous Q24H   ??? fluticasone  1 spray Each Nare Daily   ??? guaiFENesin  400 mg Oral 4x Daily   ??? chlorpheniramine  4 mg Oral TID   ??? heparin flush  3 mL Intravenous 2 times per day   ??? amLODIPine  2.5 mg Oral Nightly   ??? atorvastatin  80 mg Oral Daily   ??? cholestyramine light  4 g Oral Daily   ??? hydrochlorothiazide  25 mg Oral Daily   ??? docusate sodium  100 mg Oral BID     Continuous Infusions:  ??? 0.9% NaCl with KCl 20 mEq 75 mL/hr at 06/27/15 0023     PRN Meds:sodium chloride flush, heparin flush, ondansetron, traMADol, acetaminophen, meperidine, prochlorperazine    Allergy reviewed:  Enalapril; Esomeprazole; Nexium [esomeprazole magnesium trihydrate]; Nsaids; Ranitidine; and Reglan [metoclopramide hcl]    OBJECTIVE:  Visit Vitals   ??? BP 138/69   ??? Pulse 75   ??? Temp 98.4 ??F (36.9 ??C) (Oral)   ??? Resp 18   ??? Ht 5\' 9"  (1.753 m)   ??? Wt 198 lb (89.8 kg)   ??? SpO2 93%   ??? BMI 29.24 kg/m2     GA: Alert, awake, not in acute distress  HEENT: Not pale, no icteric sclerae, no oral thrush  Neck: No cervical lymphadenopathy  Heart: Normal S1, S2, no murmur  Lungs: Clear to auscultation both lungs. No adventitious sounds.   Abdomen: Bowel sound present. Soft, not tender, no guarding. No hepatosplenomegaly. No CVA tendenreness  Ext: No edema  Neuro: A&Ox3. No focal weakness or numbness.   Skin: No rash, no bruises, no thromboembolic phenomenon.  Psych: Appropriate mood and affect.  Line:foley  ??    Laboratory and Tests Review:  Lab Results    Component Value Date    WBC 7.6 06/25/2015    WBC 8.9 06/24/2015    WBC 7.6 06/22/2015    HGB 13.0 06/25/2015    HCT 39.0 06/25/2015    MCV 85.2 06/25/2015    PLT 83 (L) 06/25/2015     Lab Results   Component Value Date    NEUTROABS 6.38 06/25/2015    NEUTROABS 7.57 (H) 06/24/2015    NEUTROABS 7.30 06/22/2015     Lab Results   Component Value Date    CRP 12.5 (H) 06/26/2015     Lab Results   Component Value Date    SEDRATE 60 (H) 06/26/2015    SEDRATE 7 02/13/2011    SEDRATE 2 02/27/2010     Lab Results   Component Value Date    ALT 29 06/22/2015    AST 36 06/22/2015    ALKPHOS 103 06/22/2015    BILITOT 4.5 (H) 06/22/2015     Lab Results   Component Value Date    NA 138 06/26/2015    K 4.1 06/26/2015    CL 98 06/26/2015    CO2 30  06/26/2015    BUN 19 06/26/2015    BUN 20 06/21/2009    CREATININE 1.1 06/26/2015    CREATININE 1.2 06/22/2015    CREATININE 1.1 06/22/2015    CREATININE 0.8 06/21/2009    GFRAA >60 06/26/2015    LABGLOM >60 06/26/2015    LABGLOM >60 06/21/2009    GLUCOSE 100 06/26/2015    GLUCOSE 86 06/21/2009    PROT 6.0 06/22/2015    LABALBU 3.6 06/22/2015    CALCIUM 9.2 06/26/2015    BILITOT 4.5 06/22/2015    ALKPHOS 103 06/22/2015    AST 36 06/22/2015    ALT 29 06/22/2015       Radiology:  Reviewed    Microbiology:   Cultures reviewed  Urine Culture [[id number]] (Abnormal) Collected: 06/22/15 2320    ?? Order Status: Completed Specimen: Urine, straight catheter Updated: 06/24/15 1207   ??  Organism Escherichia coli (A)   ??  Urine Culture, Routine --   ??  >100,000 CFU/ml   Sensitivity to follow      ?? Narrative: ??   ?? Source: [facility] ?? ?? ?? Site: ?? ?? ?? ?? ??    ?? Culture Blood #2 [[id number]] (Abnormal) Collected: 06/22/15 2333   ?? Order Status: Completed Specimen: Blood Whole Citrate Updated: 06/24/15 1021   ??  Culture, Blood 2 -- (A)   ??  Gram stain performed from blood culture bottle media   Gram negative rods   Growth in both bottles      ?? Narrative: ??   ?? CALL ??[facility] tel. ,  Microbiology results  called to and read back by Archie Balboa RN, 06/24/2015  10:21, by Luanne Bras   ?? Culture Blood #1 [[id number]] (Abnormal) Collected: 06/22/15 2323   ?? Order Status: Completed Specimen: Blood Whole Citrate Updated: 06/24/15 1016   ??  Blood Culture, Routine -- (A)   ??  Gram stain performed from blood culture bottle media   Gram negative rods   Growth in both bottles      ?? Narrative: ??   ?? CALL ??[facility] tel. ,  Microbiology results called to and read back by Archie Balboa RN, 06/24/2015  10:16, by Luanne Bras     Blood Culture, Routine (Abnormal)    ?? Growth in both bottles   ?? Organism (Abnormal)   ?? Escherichia coli   ??   Narrative    ?? Source: BLOOD ?? ?? ?? Site: ?? ?? ?? ?? ?? Microbiology results called to and read back by Archie Balboa RN, 06/24/2015  10:16, by Luanne Bras   ??   Culture & Susceptibility    ?? ESCHERICHIA COLI    ?? Antibiotic Interpretation MIC Unit   ?? Confirmatory Extended Spectrum Beta-Lactamase  Neg mcg/mL   ?? amoxicillin-clavulanate Intermediate =^16 mcg/mL   ?? ampicillin Resistant >=^32 mcg/mL   ?? ceFAZolin Sensitive <=^4 mcg/mL   ?? cefTRIAXone Sensitive <=^1 mcg/mL   ?? cefepime Sensitive <=^1 mcg/mL   ?? gentamicin Resistant >=^16 mcg/mL   ?? imipenem Sensitive <=^0.25 mcg/mL   ?? levofloxacin Resistant >=^8 mcg/mL   ?? piperacillin-tazobactam Sensitive <=^4 mcg/mL   ?? tobramycin Intermediate =^8 mcg/mL   ?? trimethoprim-sulfamethoxazole Resistant >=^320 mcg/mL   ??    ??            ??  ASSESSMENT  1. Sepsis post transrectal prostate biopsy, complicated UTI Ecoli   2. Ecoli bacteremia from above  3. Elevated PSA  4. sinusitis  ??  RECOMMENDATION  -  rocephin 2 gm daily   - follow cultures, repeat BC  - foley, check residual if remove for possible retention  - uro following  -  anticipating IV ABX on DC if afebrile 48h, likely tomorrow morning  ??CT abd pelvis - good  ??  ??  Junah Yam  1:03 PM  06/27/2015

## 2015-06-28 ENCOUNTER — Encounter

## 2015-06-28 MED FILL — HYDROCHLOROTHIAZIDE 25 MG PO TABS: 25 MG | ORAL | Qty: 1

## 2015-06-28 MED FILL — CEFTRIAXONE SODIUM 2 G IJ SOLR: 2 g | INTRAMUSCULAR | Qty: 2

## 2015-06-28 MED FILL — PROCHLORPERAZINE EDISYLATE 5 MG/ML IJ SOLN: 5 MG/ML | INTRAMUSCULAR | Qty: 2

## 2015-06-28 MED FILL — HEPARIN LOCK FLUSH 100 UNIT/ML IV SOLN: 100 UNIT/ML | INTRAVENOUS | Qty: 5

## 2015-06-28 MED FILL — MONOJECT FLUSH SYRINGE 0.9 % IV SOLN: 0.9 % | INTRAVENOUS | Qty: 10

## 2015-06-28 MED FILL — AMLODIPINE BESYLATE 2.5 MG PO TABS: 2.5 MG | ORAL | Qty: 1

## 2015-06-28 MED FILL — MUCUS RELIEF 400 MG PO TABS: 400 MG | ORAL | Qty: 1

## 2015-06-28 MED FILL — DOCUSATE SODIUM 100 MG PO CAPS: 100 MG | ORAL | Qty: 1

## 2015-06-28 MED FILL — PREVALITE 4 G PO PACK: 4 g | ORAL | Qty: 1

## 2015-06-28 MED FILL — CHLORPHENIRAMINE MALEATE 4 MG PO TABS: 4 MG | ORAL | Qty: 1

## 2015-06-28 MED FILL — POTASSIUM CHLORIDE IN NACL 20-0.9 MEQ/L-% IV SOLN: INTRAVENOUS | Qty: 1000

## 2015-06-28 MED FILL — LIPITOR 40 MG PO TABS: 40 MG | ORAL | Qty: 2

## 2015-06-28 MED FILL — POLYETHYLENE GLYCOL 3350 17 G PO PACK: 17 g | ORAL | Qty: 1

## 2015-06-28 NOTE — Progress Notes (Addendum)
[facility]  [facility]  Progress Note    Chief Complaint   Patient presents with   ??? Post-op Problem     prostate biopsy 2 days ago, here with same symptoms yesterday       SUBJECTIVE:    Afebrile, no leukocytosis  Patient is tolerating medications. No reported adverse drug reactions.  No nausea, vomiting, diarrhea.    Medications:  Scheduled Meds:  ??? polyethylene glycol  17 g Oral Daily   ??? cefTRIAXone (ROCEPHIN) IV  2 g Intravenous Q24H   ??? fluticasone  1 spray Each Nare Daily   ??? guaiFENesin  400 mg Oral 4x Daily   ??? chlorpheniramine  4 mg Oral TID   ??? heparin flush  3 mL Intravenous 2 times per day   ??? amLODIPine  2.5 mg Oral Nightly   ??? atorvastatin  80 mg Oral Daily   ??? cholestyramine light  4 g Oral Daily   ??? hydrochlorothiazide  25 mg Oral Daily   ??? docusate sodium  100 mg Oral BID     Continuous Infusions:  ??? 0.9% NaCl with KCl 20 mEq 75 mL/hr at 06/28/15 0315     PRN Meds:miconazole nitrate, sodium chloride flush, heparin flush, ondansetron, traMADol, acetaminophen, meperidine, prochlorperazine    Allergy reviewed:  Enalapril; Esomeprazole; Nexium [esomeprazole magnesium trihydrate]; Nsaids; Ranitidine; and Reglan [metoclopramide hcl]    OBJECTIVE:  Visit Vitals   ??? BP (!) 154/74   ??? Pulse 74   ??? Temp 98.8 ??F (37.1 ??C) (Oral)   ??? Resp 18   ??? Ht 5\' 9"  (1.753 m)   ??? Wt 198 lb (89.8 kg)   ??? SpO2 94%   ??? BMI 29.24 kg/m2     GA: Alert, awake, not in acute distress  HEENT: Not pale, no icteric sclerae, no oral thrush  Neck: No cervical lymphadenopathy  Heart: Normal S1, S2, no murmur  Lungs: Clear to auscultation both lungs. No adventitious sounds.   Abdomen: Bowel sound present. Soft, not tender, no guarding. No hepatosplenomegaly. No CVA tendenreness  Ext: No edema  Neuro: A&Ox3. No focal weakness or numbness.   Skin: No rash, no bruises, no thromboembolic phenomenon.  Psych: Appropriate mood and affect.  Line picc  ??    Laboratory and Tests Review:  Lab Results   Component Value  Date    WBC 7.6 06/25/2015    WBC 8.9 06/24/2015    WBC 7.6 06/22/2015    HGB 13.0 06/25/2015    HCT 39.0 06/25/2015    MCV 85.2 06/25/2015    PLT 83 (L) 06/25/2015     Lab Results   Component Value Date    NEUTROABS 6.38 06/25/2015    NEUTROABS 7.57 (H) 06/24/2015    NEUTROABS 7.30 06/22/2015     Lab Results   Component Value Date    CRP 12.5 (H) 06/26/2015     Lab Results   Component Value Date    SEDRATE 60 (H) 06/26/2015    SEDRATE 7 02/13/2011    SEDRATE 2 02/27/2010     Lab Results   Component Value Date    ALT 29 06/22/2015    AST 36 06/22/2015    ALKPHOS 103 06/22/2015    BILITOT 4.5 (H) 06/22/2015     Lab Results   Component Value Date    NA 138 06/26/2015    K 4.1 06/26/2015    CL 98 06/26/2015    CO2 30 06/26/2015    BUN  19 06/26/2015    BUN 20 06/21/2009    CREATININE 1.1 06/26/2015    CREATININE 1.2 06/22/2015    CREATININE 1.1 06/22/2015    CREATININE 0.8 06/21/2009    GFRAA >60 06/26/2015    LABGLOM >60 06/26/2015    LABGLOM >60 06/21/2009    GLUCOSE 100 06/26/2015    GLUCOSE 86 06/21/2009    PROT 6.0 06/22/2015    LABALBU 3.6 06/22/2015    CALCIUM 9.2 06/26/2015    BILITOT 4.5 06/22/2015    ALKPHOS 103 06/22/2015    AST 36 06/22/2015    ALT 29 06/22/2015       Radiology:  Reviewed    Microbiology:   Cultures reviewed  Urine Culture [[id number]] (Abnormal) Collected: 06/22/15 2320    ?? Order Status: Completed Specimen: Urine, straight catheter Updated: 06/24/15 1207   ??  Organism Escherichia coli (A)   ??  Urine Culture, Routine --   ??  >100,000 CFU/ml   Sensitivity to follow      ?? Narrative: ??   ?? Source: [facility] ?? ?? ?? Site: ?? ?? ?? ?? ??    ?? Culture Blood #2 [[id number]] (Abnormal) Collected: 06/22/15 2333   ?? Order Status: Completed Specimen: Blood Whole Citrate Updated: 06/24/15 1021   ??  Culture, Blood 2 -- (A)   ??  Gram stain performed from blood culture bottle media   Gram negative rods   Growth in both bottles      ?? Narrative: ??   ?? CALL ??[facility] tel. ,  Microbiology results called to and read  back by Archie Balboa RN, 06/24/2015  10:21, by Luanne Bras   ?? Culture Blood #1 [[id number]] (Abnormal) Collected: 06/22/15 2323   ?? Order Status: Completed Specimen: Blood Whole Citrate Updated: 06/24/15 1016   ??  Blood Culture, Routine -- (A)   ??  Gram stain performed from blood culture bottle media   Gram negative rods   Growth in both bottles      ?? Narrative: ??   ?? CALL ??[facility] tel. ,  Microbiology results called to and read back by Archie Balboa RN, 06/24/2015  10:16, by Luanne Bras     Blood Culture, Routine (Abnormal)    ?? Growth in both bottles   ?? Organism (Abnormal)   ?? Escherichia coli   ??   Narrative    ?? Source: BLOOD ?? ?? ?? Site: ?? ?? ?? ?? ?? Microbiology results called to and read back by Archie Balboa RN, 06/24/2015  10:16, by Luanne Bras   ??   Culture & Susceptibility    ?? ESCHERICHIA COLI    ?? Antibiotic Interpretation MIC Unit   ?? Confirmatory Extended Spectrum Beta-Lactamase  Neg mcg/mL   ?? amoxicillin-clavulanate Intermediate =^16 mcg/mL   ?? ampicillin Resistant >=^32 mcg/mL   ?? ceFAZolin Sensitive <=^4 mcg/mL   ?? cefTRIAXone Sensitive <=^1 mcg/mL   ?? cefepime Sensitive <=^1 mcg/mL   ?? gentamicin Resistant >=^16 mcg/mL   ?? imipenem Sensitive <=^0.25 mcg/mL   ?? levofloxacin Resistant >=^8 mcg/mL   ?? piperacillin-tazobactam Sensitive <=^4 mcg/mL   ?? tobramycin Intermediate =^8 mcg/mL   ?? trimethoprim-sulfamethoxazole Resistant >=^320 mcg/mL   ??    ??            ??  ASSESSMENT  1. Sepsis post transrectal prostate biopsy, complicated UTI Ecoli   2. Ecoli bacteremia from above  3. Elevated PSA  4. sinusitis  ??  RECOMMENDATION  -  rocephin 2  gm daily anticipating at least 2-3 more weeks of rocephin and then may change to po omnicef anticipating total 4 weeks  - follow cultures, repeat BC  - uro following foley removed  - check bladder scan PVR. OK for DC if normal  ??  ??  Renada Cronin  12:33 PM  06/28/2015

## 2015-06-28 NOTE — Progress Notes (Signed)
06/28/2015 11:18 AM  Service: Urology  Group: [facility] ([facility] Buffin/Ricchiuti/Styn)    Julian SnareVincent J Dubow  [id number]    Chief urologic compliant: prostate cancer, complicated UTI  HPI:  Patient is doing better  He does not have pain  His foley was removed  He is going to get antibiotics for 3 weeks  He will need outpatient FU  All options were discussed    Review of Systems:  Respiratory: negative for cough and hemoptysis  Cardiovascular: negative for chest pain and dyspnea  Gastrointestinal: negative for abdominal pain, diarrhea, nausea and vomiting  Derm: negative for rash and skin lesion(s)  Neurological: negative for seizures and tremors  Endocrine: negative for diabetic symptoms including polydipsia and polyuria  GU: As above in the HPI, otherwise negative  All other reviews are negative     Allergies: Enalapril; Esomeprazole; Nexium [esomeprazole magnesium trihydrate]; Nsaids; Ranitidine; and Reglan [metoclopramide hcl]    Objective:   Vitals:    06/28/15 0915   BP:    Pulse:    Resp:    Temp:    SpO2: 94%       Neuro: A/A/O x3  Respiratory: non labored breathing  ABD: soft non-tender, non-distended  GU: foley no  Ext: no clubbing, cyanosis, edema    Labs:   No results for input(s): WBC, RBC, HGB, HCT, MCV, MCH, MCHC, RDW, PLT, MPV in the last 72 hours.    Recent Labs      06/26/15   0454   CREATININE  1.1       Assessment: Julian Castaneda 75 y.o. male     Prostate cancer  Complicated UTI post bx      Plan:    Cont antibiotics per ID  DC home today  Will need outpatient FU  All options were discussed      Bary CastillaMark A Gaylen Venning, DO   [facility]

## 2015-06-28 NOTE — Care Coordination-Inpatient (Signed)
Social Work/Discharge Planning:  Called Diane at [facility] and informed her patient will discharge home today.  She stated patient to arrive at [facility]fusion [facility] at 8:00am on Saturday and Sunday.  Placed information in patient discharge instructions.  Will continue to follow.  Electronically signed by Felicity CoyerNina T Latroy Gaymon, LSW on 06/28/2015 at 10:46 AM

## 2015-06-28 NOTE — Discharge Instructions (Signed)
Good nutrition is important when healing from an illness, injury, or surgery. Follow any nutrition recommendations given to you during the hospital stay. If you are instructed to take an oral nutrition supplement at home, you can take it with meals, in-between meals, and/or before bedtime. These supplements can be purchased at most local grocery stores, pharmacies, and chain super-stores. If you need help in covering the cost of your medication or oral supplement, visit www.RxAssist.org. If you have any questions about your diet or nutrition, call the hospital and ask for the dietitian.       Your nutrition orders at the time of discharge were:  DIET GENERAL;.

## 2015-06-28 NOTE — Discharge Summary (Signed)
Physician Discharge Summary     Patient ID:  Rana SnareVincent J Horen  [id number]  75 y.o.  Dec 04, 1940    Admit date: 06/24/2015    Discharge date and time: 06/28/2015    Admitting Physician: Gaynelle CageNicholas R Styn, MD     Admission Diagnoses: FEVER    Discharge Diagnoses: SAME    Hospital Course:  UNEVENTFUL    Treatments: IV hydration and antibiotics    Disposition: home    Patient Instructions:   Current Discharge Medication List      START taking these medications    Details   cefTRIAXone (ROCEPHIN) 1 G injection Infuse 2 g intravenously every 24 hours for 20 days  Qty: 28 g, Refills: 0         CONTINUE these medications which have NOT CHANGED    Details   colestipol (COLESTID) 1 G tablet Take 1 g by mouth 2 times daily      ciprofloxacin (CIPRO) 250 MG tablet Take 250 mg by mouth 2 times daily Stop 06/24/15      amLODIPine (NORVASC) 2.5 MG tablet Take 2.5 mg by mouth nightly      hydrochlorothiazide (HYDRODIURIL) 25 MG tablet Take 25 mg by mouth daily.        atorvastatin (LIPITOR) 80 MG tablet Take 80 mg by mouth daily.      XOPENEX HFA 45 MCG/ACT inhaler Inhale 1 puff into the lungs every 8 hours as needed for Wheezing.  Qty: 1 Inhaler, Refills: 12    Associated Diagnoses: Environmental allergies; Asthma; Allergic rhinitis         STOP taking these medications       ondansetron (ZOFRAN) 4 MG tablet Comments:   Reason for Stopping:                 Signed:  Setsuko Robins A Steadman Prosperi, DO  06/28/2015  11:20 AM

## 2015-06-28 NOTE — Progress Notes (Signed)
I called Dr Gaspar ColaAgnew's  Office they will call patient  to schedule an follow up appointment, they he doesn't hear from them call.  Roz RN noified  Julian RushingSusan Dawson Castaneda 06/28/2015 1:07 PM

## 2015-06-28 NOTE — Progress Notes (Signed)
Diane at [facility] notified [facility] being discharged today. Confirmed appointment set up for tomorrow morning at 0800.

## 2015-06-29 ENCOUNTER — Inpatient Hospital Stay: Admit: 2015-06-29 | Attending: Infectious Disease | Primary: Family Medicine

## 2015-06-29 DIAGNOSIS — N390 Urinary tract infection, site not specified: Secondary | ICD-10-CM

## 2015-06-29 MED ORDER — NORMAL SALINE FLUSH 0.9 % IV SOLN
0.9 % | INTRAVENOUS | Status: AC | PRN
Start: 2015-06-29 — End: 2015-06-30
  Administered 2015-06-29 (×2): via INTRAVENOUS

## 2015-06-29 MED ORDER — HEPARIN SOD (PORK) LOCK FLUSH 100 UNIT/ML IV SOLN
100 UNIT/ML | INTRAVENOUS | Status: DC | PRN
Start: 2015-06-29 — End: 2015-07-17

## 2015-06-29 MED ORDER — DEXTROSE 5 % IV SOLN (MINI-BAG)
5 % | Freq: Once | INTRAVENOUS | Status: AC
Start: 2015-06-29 — End: 2015-06-29
  Administered 2015-06-29: 13:00:00 2 g via INTRAVENOUS

## 2015-06-29 MED FILL — HEPARIN LOCK FLUSH 100 UNIT/ML IV SOLN: 100 UNIT/ML | INTRAVENOUS | Qty: 5

## 2015-06-29 MED FILL — NORMAL SALINE FLUSH 0.9 % IV SOLN: 0.9 % | INTRAVENOUS | Qty: 30

## 2015-06-29 MED FILL — CEFTRIAXONE SODIUM 2 G IJ SOLR: 2 g | INTRAMUSCULAR | Qty: 2

## 2015-06-29 NOTE — Progress Notes (Signed)
PATIENT WAS GIVEN FALLS SAFETY HANDOUT AND WAS INSTRUCTED ON FALLS SAFETY TIPS AND VERBALIZES UNDERSTANDING. PATIENT WAS GIVEN ROCEPHIN LEXICOMP HAND OUT AND WAS INSTRUCTED ON S/S TO REPORT PATIENT AND HE VERBALIZES UNDERSTANDING AND AGREE DENIES ANY QUESTIONS.  INSTRUCTED ON COMPLIANCE OF PLAN OF CARE.  PATIENT VERBALIZES UNDERSTANDING.  Tolerated infusion well.  Therapy plan reviewed with patient.  Verbalizes understanding.  Declines copy of AVS, verbalizes good knowledge of current plan, and has no signs or symptoms to report at this time. Patient instructed on s/s infection/complication with PICC line to report and instructed on keeping PICC dressing clean, dry and intact patient verbalizes understanding. Next appointment scheduled.  Gabrielle DareCatherine Shalandra Leu RN  06/29/2015  [phone]

## 2015-06-30 ENCOUNTER — Inpatient Hospital Stay: Attending: Infectious Disease | Primary: Family Medicine

## 2015-06-30 LAB — CULTURE, BLOOD 2: Culture, Blood 2: 5

## 2015-06-30 LAB — CULTURE BLOOD #1: Blood Culture, Routine: 5

## 2015-06-30 MED ORDER — CEFTRIAXONE SODIUM 2 G IJ SOLR
2 g | Freq: Once | INTRAMUSCULAR | Status: AC
Start: 2015-06-30 — End: 2015-06-30
  Administered 2015-06-30: 13:00:00 via INTRAVENOUS

## 2015-06-30 MED ORDER — HEPARIN LOCK FLUSH 100 UNIT/ML IV SOLN
100 UNIT/ML | INTRAVENOUS | Status: DC | PRN
Start: 2015-06-30 — End: 2015-07-18
  Administered 2015-06-30: 14:00:00

## 2015-06-30 MED ORDER — NORMAL SALINE FLUSH 0.9 % IV SOLN
0.9 % | INTRAVENOUS | Status: AC | PRN
Start: 2015-06-30 — End: 2015-07-01
  Administered 2015-06-30 (×3): via INTRAVENOUS

## 2015-06-30 MED FILL — NORMAL SALINE FLUSH 0.9 % IV SOLN: 0.9 % | INTRAVENOUS | Qty: 30

## 2015-06-30 MED FILL — CEFTRIAXONE SODIUM 2 G IJ SOLR: 2 g | INTRAMUSCULAR | Qty: 2

## 2015-06-30 MED FILL — HEPARIN LOCK FLUSH 100 UNIT/ML IV SOLN: 100 UNIT/ML | INTRAVENOUS | Qty: 5

## 2015-06-30 NOTE — Progress Notes (Signed)
Tolerated infusion well.  Therapy plan reviewed with patient.  Verbalizes understanding.  Declines copy of AVS and any medication information, verbalizes good knowledge of current plan, and has no signs or symptoms to report at this time. Patient instructed on s/s infection/complication with PICC line to report and instructed on keeping PICC dressing clean, dry and intact patient verbalizes understanding. Next appointment scheduled.  Gabrielle DareCatherine Armella Stogner RN  06/30/2015  [phone]

## 2015-07-01 ENCOUNTER — Inpatient Hospital Stay: Admit: 2015-07-01 | Attending: Infectious Disease | Primary: Family Medicine

## 2015-07-01 DIAGNOSIS — N390 Urinary tract infection, site not specified: Secondary | ICD-10-CM

## 2015-07-01 LAB — COMPREHENSIVE METABOLIC PANEL
ALT: 25 U/L (ref 0–40)
AST: 27 U/L (ref 0–39)
Albumin: 3.6 g/dL (ref 3.5–5.2)
Alkaline Phosphatase: 97 U/L (ref 40–129)
Anion Gap: 15 mmol/L (ref 7–16)
BUN: 20 mg/dL (ref 8–23)
CO2: 23 mmol/L (ref 22–29)
Calcium: 9 mg/dL (ref 8.6–10.2)
Chloride: 96 mmol/L — ABNORMAL LOW (ref 98–107)
Creatinine: 0.9 mg/dL (ref 0.7–1.2)
GFR African American: >60
GFR Non-African American: >60 mL/min/{1.73_m2} (ref >=60)
Glucose: 112 mg/dL — ABNORMAL HIGH (ref 74–109)
Potassium: 4.1 mmol/L (ref 3.5–5.0)
Sodium: 134 mmol/L (ref 132–146)
Total Bilirubin: 1.3 mg/dL — ABNORMAL HIGH (ref 0.0–1.2)
Total Protein: 7.1 g/dL (ref 6.4–8.3)

## 2015-07-01 LAB — CBC WITH AUTO DIFFERENTIAL
Basophils %: 0.2 % (ref 0.0–2.0)
Basophils Absolute: 0.02 E9/L (ref 0.00–0.20)
Eosinophils %: 0.6 % (ref 0.0–6.0)
Eosinophils Absolute: 0.06 E9/L (ref 0.05–0.50)
Hematocrit: 39.8 % (ref 37.0–54.0)
Hemoglobin: 13.1 g/dL (ref 12.5–16.5)
Immature Granulocytes #: 0.06 E9/L
Immature Granulocytes %: 0.6 % (ref 0.0–5.0)
Lymphocytes %: 16.6 % — ABNORMAL LOW (ref 20.0–42.0)
Lymphocytes Absolute: 1.55 E9/L (ref 1.50–4.00)
MCH: 27.9 pg (ref 26.0–35.0)
MCHC: 32.9 % (ref 32.0–34.5)
MCV: 84.9 fL (ref 80.0–99.9)
MPV: 10.3 fL (ref 7.0–12.0)
Monocytes %: 6.3 % (ref 2.0–12.0)
Monocytes Absolute: 0.59 E9/L (ref 0.10–0.95)
Neutrophils %: 75.7 % (ref 43.0–80.0)
Neutrophils Absolute: 7.08 E9/L (ref 1.80–7.30)
Platelets: 183 E9/L (ref 130–450)
RBC: 4.69 E12/L (ref 3.80–5.80)
RDW: 14 fL (ref 11.5–15.0)
WBC: 9.4 E9/L (ref 4.5–11.5)

## 2015-07-01 LAB — SEDIMENTATION RATE: Sed Rate: 48 mm/Hr — ABNORMAL HIGH (ref 0–15)

## 2015-07-01 LAB — C-REACTIVE PROTEIN: CRP: 6.1 mg/dL — ABNORMAL HIGH (ref 0.0–0.4)

## 2015-07-01 MED ORDER — NORMAL SALINE FLUSH 0.9 % IV SOLN
0.9 % | INTRAVENOUS | Status: DC | PRN
Start: 2015-07-01 — End: 2015-07-02
  Administered 2015-07-01 (×4): via INTRAVENOUS

## 2015-07-01 MED ORDER — HEPARIN SOD (PORK) LOCK FLUSH 100 UNIT/ML IV SOLN
100 UNIT/ML | INTRAVENOUS | Status: DC | PRN
Start: 2015-07-01 — End: 2015-07-02
  Administered 2015-07-01: 14:00:00

## 2015-07-01 MED ORDER — CEFTRIAXONE SODIUM 2 G IJ SOLR
2 g | Freq: Once | INTRAMUSCULAR | Status: AC
Start: 2015-07-01 — End: 2015-07-01
  Administered 2015-07-01: 13:00:00 via INTRAVENOUS

## 2015-07-01 MED FILL — HEPARIN LOCK FLUSH 100 UNIT/ML IV SOLN: 100 UNIT/ML | INTRAVENOUS | Qty: 5

## 2015-07-01 MED FILL — NORMAL SALINE FLUSH 0.9 % IV SOLN: 0.9 % | INTRAVENOUS | Qty: 40

## 2015-07-01 MED FILL — CEFTRIAXONE SODIUM 2 G IJ SOLR: 2 g | INTRAMUSCULAR | Qty: 2

## 2015-07-01 NOTE — Progress Notes (Signed)
Tolerated infusion well.  Therapy plan reviewed with patient.  Verbalizes understanding.  Declines copy of AVS and any medication information, verbalizes good knowledge of current plan, and has no signs or symptoms to report at this time. Patient instructed on s/s infection/complication with PICC line to report and instructed on keeping PICC dressing clean, dry and intact patient verbalizes understanding. Next appointment scheduled.

## 2015-07-01 NOTE — Progress Notes (Signed)
Labs faxed to [facility]

## 2015-07-02 ENCOUNTER — Inpatient Hospital Stay: Admit: 2015-07-02 | Attending: Infectious Disease | Primary: Family Medicine

## 2015-07-02 DIAGNOSIS — N390 Urinary tract infection, site not specified: Secondary | ICD-10-CM

## 2015-07-02 MED ORDER — CEFTRIAXONE SODIUM 2 G IJ SOLR
2 g | Freq: Once | INTRAMUSCULAR | Status: AC
Start: 2015-07-02 — End: 2015-07-02
  Administered 2015-07-02: 13:00:00 via INTRAVENOUS

## 2015-07-02 MED ORDER — NORMAL SALINE FLUSH 0.9 % IV SOLN
0.9 % | INTRAVENOUS | Status: DC | PRN
Start: 2015-07-02 — End: 2015-07-03
  Administered 2015-07-02 (×3): via INTRAVENOUS

## 2015-07-02 MED ORDER — HEPARIN SOD (PORK) LOCK FLUSH 100 UNIT/ML IV SOLN
100 UNIT/ML | INTRAVENOUS | Status: DC | PRN
Start: 2015-07-02 — End: 2015-07-03
  Administered 2015-07-02: 14:00:00

## 2015-07-02 MED FILL — NORMAL SALINE FLUSH 0.9 % IV SOLN: 0.9 % | INTRAVENOUS | Qty: 30

## 2015-07-02 MED FILL — CEFTRIAXONE SODIUM 2 G IJ SOLR: 2 g | INTRAMUSCULAR | Qty: 2

## 2015-07-02 MED FILL — HEPARIN LOCK FLUSH 100 UNIT/ML IV SOLN: 100 UNIT/ML | INTRAVENOUS | Qty: 5

## 2015-07-02 NOTE — Progress Notes (Signed)
Tolerated infusion well.  Therapy plan reviewed with patient.  Verbalizes understanding.  Declines copy of AVS and any medication information, verbalizes good knowledge of current plan, and has no signs or symptoms to report at this time. Patient instructed on s/s infection/complication with PICC line to report and instructed on keeping PICC dressing clean, dry and intact patient verbalizes understanding. Next appointment scheduled.

## 2015-07-03 ENCOUNTER — Inpatient Hospital Stay: Admit: 2015-07-03 | Attending: Infectious Disease | Primary: Family Medicine

## 2015-07-03 DIAGNOSIS — N390 Urinary tract infection, site not specified: Secondary | ICD-10-CM

## 2015-07-03 MED ORDER — NORMAL SALINE FLUSH 0.9 % IV SOLN
0.9 % | INTRAVENOUS | Status: DC | PRN
Start: 2015-07-03 — End: 2015-07-04
  Administered 2015-07-03 (×3): via INTRAVENOUS

## 2015-07-03 MED ORDER — HEPARIN SOD (PORK) LOCK FLUSH 100 UNIT/ML IV SOLN
100 UNIT/ML | INTRAVENOUS | Status: DC | PRN
Start: 2015-07-03 — End: 2015-07-04
  Administered 2015-07-03: 14:00:00

## 2015-07-03 MED ORDER — CEFTRIAXONE SODIUM 2 G IJ SOLR
2 g | Freq: Once | INTRAMUSCULAR | Status: AC
Start: 2015-07-03 — End: 2015-07-03
  Administered 2015-07-03: 13:00:00 via INTRAVENOUS

## 2015-07-03 MED FILL — HEPARIN LOCK FLUSH 100 UNIT/ML IV SOLN: 100 UNIT/ML | INTRAVENOUS | Qty: 5

## 2015-07-03 MED FILL — NORMAL SALINE FLUSH 0.9 % IV SOLN: 0.9 % | INTRAVENOUS | Qty: 30

## 2015-07-03 MED FILL — CEFTRIAXONE SODIUM 2 G IJ SOLR: 2 g | INTRAMUSCULAR | Qty: 2

## 2015-07-03 NOTE — Progress Notes (Signed)
Tolerated infusion well.  Therapy plan reviewed with patient.  Verbalizes understanding.  Declines copy of AVS and any medication information, verbalizes good knowledge of current plan, and has no signs or symptoms to report at this time. Patient instructed on s/s infection/complication with PICC line to report and instructed on keeping PICC dressing clean, dry and intact patient verbalizes understanding. Next appointment scheduled.

## 2015-07-04 ENCOUNTER — Inpatient Hospital Stay: Admit: 2015-07-04 | Attending: Infectious Disease | Primary: Family Medicine

## 2015-07-04 DIAGNOSIS — N390 Urinary tract infection, site not specified: Secondary | ICD-10-CM

## 2015-07-04 LAB — CBC WITH AUTO DIFFERENTIAL
Basophils %: 0.5 % (ref 0.0–2.0)
Basophils Absolute: 0.03 E9/L (ref 0.00–0.20)
Eosinophils %: 0.8 % (ref 0.0–6.0)
Eosinophils Absolute: 0.05 E9/L (ref 0.05–0.50)
Hematocrit: 38.9 % (ref 37.0–54.0)
Hemoglobin: 12.8 g/dL (ref 12.5–16.5)
Immature Granulocytes #: 0.08 E9/L
Immature Granulocytes %: 1.2 % (ref 0.0–5.0)
Lymphocytes %: 14.5 % — ABNORMAL LOW (ref 20.0–42.0)
Lymphocytes Absolute: 0.94 E9/L — ABNORMAL LOW (ref 1.50–4.00)
MCH: 28.2 pg (ref 26.0–35.0)
MCHC: 32.9 % (ref 32.0–34.5)
MCV: 85.7 fL (ref 80.0–99.9)
MPV: 10.3 fL (ref 7.0–12.0)
Monocytes %: 6.6 % (ref 2.0–12.0)
Monocytes Absolute: 0.43 E9/L (ref 0.10–0.95)
Neutrophils %: 76.4 % (ref 43.0–80.0)
Neutrophils Absolute: 4.95 E9/L (ref 1.80–7.30)
Platelets: 252 E9/L (ref 130–450)
RBC: 4.54 E12/L (ref 3.80–5.80)
RDW: 13.9 fL (ref 11.5–15.0)
WBC: 6.5 E9/L (ref 4.5–11.5)

## 2015-07-04 LAB — COMPREHENSIVE METABOLIC PANEL
ALT: 23 U/L (ref 0–40)
AST: 24 U/L (ref 0–39)
Albumin: 3.6 g/dL (ref 3.5–5.2)
Alkaline Phosphatase: 98 U/L (ref 40–129)
Anion Gap: 17 mmol/L — ABNORMAL HIGH (ref 7–16)
BUN: 21 mg/dL (ref 8–23)
CO2: 23 mmol/L (ref 22–29)
Calcium: 9.1 mg/dL (ref 8.6–10.2)
Chloride: 97 mmol/L — ABNORMAL LOW (ref 98–107)
Creatinine: 0.9 mg/dL (ref 0.7–1.2)
GFR African American: >60
GFR Non-African American: >60 mL/min/{1.73_m2} (ref >=60)
Glucose: 189 mg/dL — ABNORMAL HIGH (ref 74–109)
Potassium: 3.6 mmol/L (ref 3.5–5.0)
Sodium: 137 mmol/L (ref 132–146)
Total Bilirubin: 1.2 mg/dL (ref 0.0–1.2)
Total Protein: 7.2 g/dL (ref 6.4–8.3)

## 2015-07-04 MED ORDER — NORMAL SALINE FLUSH 0.9 % IV SOLN
0.9 % | INTRAVENOUS | Status: DC | PRN
Start: 2015-07-04 — End: 2015-07-05
  Administered 2015-07-04 (×4): via INTRAVENOUS

## 2015-07-04 MED ORDER — HEPARIN LOCK FLUSH 100 UNIT/ML IV SOLN
100 UNIT/ML | INTRAVENOUS | Status: DC | PRN
Start: 2015-07-04 — End: 2015-07-05
  Administered 2015-07-04: 14:00:00

## 2015-07-04 MED ORDER — CEFTRIAXONE SODIUM 2 G IJ SOLR
2 g | Freq: Once | INTRAMUSCULAR | Status: AC
Start: 2015-07-04 — End: 2015-07-04
  Administered 2015-07-04: 13:00:00 via INTRAVENOUS

## 2015-07-04 MED FILL — CEFTRIAXONE SODIUM 2 G IJ SOLR: 2 g | INTRAMUSCULAR | Qty: 2

## 2015-07-04 MED FILL — HEPARIN LOCK FLUSH 100 UNIT/ML IV SOLN: 100 UNIT/ML | INTRAVENOUS | Qty: 5

## 2015-07-04 MED FILL — NORMAL SALINE FLUSH 0.9 % IV SOLN: 0.9 % | INTRAVENOUS | Qty: 40

## 2015-07-04 NOTE — Progress Notes (Signed)
Tolerated infusion well.  Therapy plan reviewed with patient.  Verbalizes understanding.  Declines copy of AVS and any medication information, verbalizes good knowledge of current plan, and has no signs or symptoms to report at this time. Patient instructed on s/s infection/complication with PICC line to report and instructed on keeping PICC dressing clean, dry and intact patient verbalizes understanding. Next appointment scheduled.

## 2015-07-04 NOTE — Progress Notes (Signed)
Faxed [facility] lab results from 07/04/15 Karolyn Messing, RN

## 2015-07-05 ENCOUNTER — Inpatient Hospital Stay: Admit: 2015-07-05 | Attending: Infectious Disease | Primary: Family Medicine

## 2015-07-05 DIAGNOSIS — N390 Urinary tract infection, site not specified: Secondary | ICD-10-CM

## 2015-07-05 MED ORDER — HEPARIN LOCK FLUSH 100 UNIT/ML IV SOLN
100 UNIT/ML | INTRAVENOUS | Status: DC | PRN
Start: 2015-07-05 — End: 2015-07-06
  Administered 2015-07-05: 18:00:00

## 2015-07-05 MED ORDER — NORMAL SALINE FLUSH 0.9 % IV SOLN
0.9 % | INTRAVENOUS | Status: DC | PRN
Start: 2015-07-05 — End: 2015-07-06
  Administered 2015-07-05 (×3): via INTRAVENOUS

## 2015-07-05 MED ORDER — CEFTRIAXONE SODIUM 2 G IJ SOLR
2 g | Freq: Once | INTRAMUSCULAR | Status: AC
Start: 2015-07-05 — End: 2015-07-05
  Administered 2015-07-05: 17:00:00 via INTRAVENOUS

## 2015-07-05 MED FILL — HEPARIN LOCK FLUSH 100 UNIT/ML IV SOLN: 100 UNIT/ML | INTRAVENOUS | Qty: 5

## 2015-07-05 MED FILL — NORMAL SALINE FLUSH 0.9 % IV SOLN: 0.9 % | INTRAVENOUS | Qty: 30

## 2015-07-05 MED FILL — CEFTRIAXONE SODIUM 2 G IJ SOLR: 2 g | INTRAMUSCULAR | Qty: 2

## 2015-07-05 NOTE — Progress Notes (Signed)
Tolerated infusion well.  Therapy plan reviewed with patient.  Verbalizes understanding.  Declines copy of AVS and any medication information, verbalizes good knowledge of current plan, and has no signs or symptoms to report at this time. Patient instructed on s/s infection/complication with PICC line to report and instructed on keeping PICC dressing clean, dry and intact patient verbalizes understanding. Next appointment scheduled.

## 2015-07-06 ENCOUNTER — Inpatient Hospital Stay: Admit: 2015-07-06 | Attending: Infectious Disease | Primary: Family Medicine

## 2015-07-06 DIAGNOSIS — N390 Urinary tract infection, site not specified: Secondary | ICD-10-CM

## 2015-07-06 MED ORDER — DEXTROSE 5 % IV SOLN (MINI-BAG)
5 % | Freq: Once | INTRAVENOUS | Status: AC
Start: 2015-07-06 — End: 2015-07-06
  Administered 2015-07-06: 14:00:00 via INTRAVENOUS

## 2015-07-06 MED ORDER — HEPARIN SOD (PORK) LOCK FLUSH 100 UNIT/ML IV SOLN
100 UNIT/ML | INTRAVENOUS | Status: DC | PRN
Start: 2015-07-06 — End: 2015-07-07
  Administered 2015-07-06: 14:00:00

## 2015-07-06 MED ORDER — NORMAL SALINE FLUSH 0.9 % IV SOLN
0.9 % | INTRAVENOUS | Status: DC | PRN
Start: 2015-07-06 — End: 2015-07-07
  Administered 2015-07-06 (×3): via INTRAVENOUS

## 2015-07-06 MED FILL — HEPARIN LOCK FLUSH 100 UNIT/ML IV SOLN: 100 UNIT/ML | INTRAVENOUS | Qty: 5

## 2015-07-06 MED FILL — CEFTRIAXONE SODIUM 2 G IJ SOLR: 2 g | INTRAMUSCULAR | Qty: 2

## 2015-07-06 MED FILL — NORMAL SALINE FLUSH 0.9 % IV SOLN: 0.9 % | INTRAVENOUS | Qty: 30

## 2015-07-06 NOTE — Progress Notes (Signed)
Tolerated infusion well.  Therapy plan reviewed with patient.  Verbalizes understanding.  Declines copy of AVS and any medication information, verbalizes good knowledge of current plan, and has no signs or symptoms to report at this time. Patient instructed on s/s infection/complication with PICC line to report and instructed on keeping PICC dressing clean, dry and intact patient verbalizes understanding. Next appointment scheduled.

## 2015-07-07 ENCOUNTER — Inpatient Hospital Stay: Admit: 2015-07-07 | Attending: Infectious Disease | Primary: Family Medicine

## 2015-07-07 DIAGNOSIS — N390 Urinary tract infection, site not specified: Secondary | ICD-10-CM

## 2015-07-07 MED ORDER — DEXTROSE 5 % IV SOLN (MINI-BAG)
5 % | Freq: Once | INTRAVENOUS | Status: AC
Start: 2015-07-07 — End: 2015-07-07
  Administered 2015-07-07: 13:00:00 2 g via INTRAVENOUS

## 2015-07-07 MED ORDER — NORMAL SALINE FLUSH 0.9 % IV SOLN
0.9 % | INTRAVENOUS | Status: DC | PRN
Start: 2015-07-07 — End: 2015-07-08
  Administered 2015-07-07 (×3): 10 mL via INTRAVENOUS

## 2015-07-07 MED ORDER — HEPARIN SOD (PORK) LOCK FLUSH 100 UNIT/ML IV SOLN
100 UNIT/ML | INTRAVENOUS | Status: DC | PRN
Start: 2015-07-07 — End: 2015-07-08
  Administered 2015-07-07: 14:00:00

## 2015-07-07 MED FILL — CEFTRIAXONE SODIUM 2 G IJ SOLR: 2 g | INTRAMUSCULAR | Qty: 2

## 2015-07-07 MED FILL — HEPARIN LOCK FLUSH 100 UNIT/ML IV SOLN: 100 UNIT/ML | INTRAVENOUS | Qty: 5

## 2015-07-07 MED FILL — NORMAL SALINE FLUSH 0.9 % IV SOLN: 0.9 % | INTRAVENOUS | Qty: 30

## 2015-07-07 NOTE — Progress Notes (Signed)
Tolerated infusion well.  Therapy plan reviewed with patient.  Verbalizes understanding.  Declines copy of AVS and any medication information, verbalizes good knowledge of current plan, and has no signs or symptoms to report at this time. Patient instructed on s/s infection/complication with PICC line to report and instructed on keeping PICC dressing clean, dry and intact patient verbalizes understanding. Next appointment scheduled.

## 2015-07-08 ENCOUNTER — Inpatient Hospital Stay: Admit: 2015-07-08 | Attending: Infectious Disease | Primary: Family Medicine

## 2015-07-08 DIAGNOSIS — N390 Urinary tract infection, site not specified: Secondary | ICD-10-CM

## 2015-07-08 LAB — COMPREHENSIVE METABOLIC PANEL
ALT: 17 U/L (ref 0–40)
AST: 22 U/L (ref 0–39)
Albumin: 3.5 g/dL (ref 3.5–5.2)
Alkaline Phosphatase: 88 U/L (ref 40–129)
Anion Gap: 14 mmol/L (ref 7–16)
BUN: 22 mg/dL (ref 8–23)
CO2: 25 mmol/L (ref 22–29)
Calcium: 9.2 mg/dL (ref 8.6–10.2)
Chloride: 100 mmol/L (ref 98–107)
Creatinine: 0.8 mg/dL (ref 0.7–1.2)
GFR African American: >60
GFR Non-African American: >60 mL/min/{1.73_m2} (ref >=60)
Glucose: 109 mg/dL (ref 74–109)
Potassium: 3.9 mmol/L (ref 3.5–5.0)
Sodium: 139 mmol/L (ref 132–146)
Total Bilirubin: 1.1 mg/dL (ref 0.0–1.2)
Total Protein: 7.2 g/dL (ref 6.4–8.3)

## 2015-07-08 LAB — CBC WITH AUTO DIFFERENTIAL
Basophils %: 1.1 % (ref 0.0–2.0)
Basophils Absolute: 0.06 E9/L (ref 0.00–0.20)
Eosinophils %: 1.3 % (ref 0.0–6.0)
Eosinophils Absolute: 0.07 E9/L (ref 0.05–0.50)
Hematocrit: 37.6 % (ref 37.0–54.0)
Hemoglobin: 12.4 g/dL — ABNORMAL LOW (ref 12.5–16.5)
Immature Granulocytes #: 0.06 E9/L
Immature Granulocytes %: 1.1 % (ref 0.0–5.0)
Lymphocytes %: 26.2 % (ref 20.0–42.0)
Lymphocytes Absolute: 1.37 E9/L — ABNORMAL LOW (ref 1.50–4.00)
MCH: 28 pg (ref 26.0–35.0)
MCHC: 33 % (ref 32.0–34.5)
MCV: 84.9 fL (ref 80.0–99.9)
MPV: 9.9 fL (ref 7.0–12.0)
Monocytes %: 9.2 % (ref 2.0–12.0)
Monocytes Absolute: 0.48 E9/L (ref 0.10–0.95)
Neutrophils %: 61.1 % (ref 43.0–80.0)
Neutrophils Absolute: 3.19 E9/L (ref 1.80–7.30)
Platelets: 324 E9/L (ref 130–450)
RBC: 4.43 E12/L (ref 3.80–5.80)
RDW: 13.4 fL (ref 11.5–15.0)
WBC: 5.2 E9/L (ref 4.5–11.5)

## 2015-07-08 LAB — SEDIMENTATION RATE: Sed Rate: 42 mm/Hr — ABNORMAL HIGH (ref 0–15)

## 2015-07-08 LAB — C-REACTIVE PROTEIN: CRP: 1.9 mg/dL — ABNORMAL HIGH (ref 0.0–0.4)

## 2015-07-08 MED ORDER — CEFTRIAXONE SODIUM 2 G IJ SOLR
2 g | Freq: Once | INTRAMUSCULAR | Status: AC
Start: 2015-07-08 — End: 2015-07-08
  Administered 2015-07-08: 13:00:00 via INTRAVENOUS

## 2015-07-08 MED ORDER — HEPARIN SOD (PORK) LOCK FLUSH 100 UNIT/ML IV SOLN
100 UNIT/ML | INTRAVENOUS | Status: DC | PRN
Start: 2015-07-08 — End: 2015-07-09
  Administered 2015-07-08: 14:00:00

## 2015-07-08 MED ORDER — NORMAL SALINE FLUSH 0.9 % IV SOLN
0.9 % | INTRAVENOUS | Status: DC | PRN
Start: 2015-07-08 — End: 2015-07-09
  Administered 2015-07-08 (×4): 10 mL via INTRAVENOUS

## 2015-07-08 MED FILL — CEFTRIAXONE SODIUM 2 G IJ SOLR: 2 g | INTRAMUSCULAR | Qty: 2

## 2015-07-08 MED FILL — HEPARIN LOCK FLUSH 100 UNIT/ML IV SOLN: 100 UNIT/ML | INTRAVENOUS | Qty: 5

## 2015-07-08 MED FILL — NORMAL SALINE FLUSH 0.9 % IV SOLN: 0.9 % | INTRAVENOUS | Qty: 40

## 2015-07-08 NOTE — Progress Notes (Signed)
Tolerated infusion well.  Therapy plan reviewed with patient.  Verbalizes understanding.  Declines copy of AVS and any medication information, verbalizes good knowledge of current plan, and has no signs or symptoms to report at this time. Patient instructed on s/s infection/complication with PICC line to report and instructed on keeping PICC dressing clean, dry and intact patient verbalizes understanding. Next appointment scheduled.

## 2015-07-08 NOTE — Progress Notes (Signed)
Labs faxed to [facility]

## 2015-07-09 ENCOUNTER — Inpatient Hospital Stay: Admit: 2015-07-09 | Attending: Infectious Disease | Primary: Family Medicine

## 2015-07-09 DIAGNOSIS — N390 Urinary tract infection, site not specified: Secondary | ICD-10-CM

## 2015-07-09 MED ORDER — NORMAL SALINE FLUSH 0.9 % IV SOLN
0.9 % | INTRAVENOUS | Status: DC | PRN
Start: 2015-07-09 — End: 2015-07-10
  Administered 2015-07-09 (×3): 10 mL via INTRAVENOUS

## 2015-07-09 MED ORDER — HEPARIN SOD (PORK) LOCK FLUSH 100 UNIT/ML IV SOLN
100 UNIT/ML | INTRAVENOUS | Status: DC | PRN
Start: 2015-07-09 — End: 2015-07-10
  Administered 2015-07-09: 14:00:00

## 2015-07-09 MED ORDER — CEFTRIAXONE SODIUM 2 G IJ SOLR
2 g | Freq: Once | INTRAMUSCULAR | Status: AC
Start: 2015-07-09 — End: 2015-07-09
  Administered 2015-07-09: 13:00:00 2 g via INTRAVENOUS

## 2015-07-09 MED FILL — HEPARIN LOCK FLUSH 100 UNIT/ML IV SOLN: 100 UNIT/ML | INTRAVENOUS | Qty: 5

## 2015-07-09 MED FILL — CEFTRIAXONE SODIUM 2 G IJ SOLR: 2 g | INTRAMUSCULAR | Qty: 2

## 2015-07-09 MED FILL — NORMAL SALINE FLUSH 0.9 % IV SOLN: 0.9 % | INTRAVENOUS | Qty: 30

## 2015-07-09 NOTE — Progress Notes (Signed)
Tolerated infusion well.  Therapy plan reviewed with patient.  Verbalizes understanding.  Declines copy of AVS and any medication information, verbalizes good knowledge of current plan, and has no signs or symptoms to report at this time. Patient instructed on s/s infection/complication with PICC line to report and instructed on keeping PICC dressing clean, dry and intact patient verbalizes understanding. Next appointment scheduled.

## 2015-07-10 ENCOUNTER — Inpatient Hospital Stay: Admit: 2015-07-10 | Attending: Infectious Disease | Primary: Family Medicine

## 2015-07-10 DIAGNOSIS — N390 Urinary tract infection, site not specified: Secondary | ICD-10-CM

## 2015-07-10 MED ORDER — HEPARIN SOD (PORK) LOCK FLUSH 100 UNIT/ML IV SOLN
100 UNIT/ML | INTRAVENOUS | Status: DC | PRN
Start: 2015-07-10 — End: 2015-07-11
  Administered 2015-07-10: 14:00:00

## 2015-07-10 MED ORDER — CEFTRIAXONE SODIUM 2 G IJ SOLR
2 g | Freq: Once | INTRAMUSCULAR | Status: AC
Start: 2015-07-10 — End: 2015-07-10
  Administered 2015-07-10: 13:00:00 2 g via INTRAVENOUS

## 2015-07-10 MED ORDER — NORMAL SALINE FLUSH 0.9 % IV SOLN
0.9 % | INTRAVENOUS | Status: DC | PRN
Start: 2015-07-10 — End: 2015-07-11
  Administered 2015-07-10 (×3): 10 mL via INTRAVENOUS

## 2015-07-10 MED FILL — HEPARIN LOCK FLUSH 100 UNIT/ML IV SOLN: 100 UNIT/ML | INTRAVENOUS | Qty: 5

## 2015-07-10 MED FILL — NORMAL SALINE FLUSH 0.9 % IV SOLN: 0.9 % | INTRAVENOUS | Qty: 30

## 2015-07-10 MED FILL — CEFTRIAXONE SODIUM 2 G IJ SOLR: 2 g | INTRAMUSCULAR | Qty: 2

## 2015-07-10 NOTE — Progress Notes (Signed)
Tolerated infusion well.  Therapy plan reviewed with patient.  Verbalizes understanding.  Declines copy of AVS and any medication information, verbalizes good knowledge of current plan, and has no signs or symptoms to report at this time. Patient instructed on s/s infection/complication with PICC line to report and instructed on keeping PICC dressing clean, dry and intact patient verbalizes understanding. Next appointment scheduled.  Gabrielle DareCatherine Shantika Bermea RN  07/10/2015   8:36 AM

## 2015-07-11 ENCOUNTER — Inpatient Hospital Stay: Admit: 2015-07-11 | Attending: Infectious Disease | Primary: Family Medicine

## 2015-07-11 DIAGNOSIS — N390 Urinary tract infection, site not specified: Secondary | ICD-10-CM

## 2015-07-11 LAB — COMPREHENSIVE METABOLIC PANEL
ALT: 22 U/L (ref 0–40)
AST: 25 U/L (ref 0–39)
Albumin: 3.7 g/dL (ref 3.5–5.2)
Alkaline Phosphatase: 88 U/L (ref 40–129)
Anion Gap: 13 mmol/L (ref 7–16)
BUN: 16 mg/dL (ref 8–23)
CO2: 25 mmol/L (ref 22–29)
Calcium: 9.2 mg/dL (ref 8.6–10.2)
Chloride: 100 mmol/L (ref 98–107)
Creatinine: 0.8 mg/dL (ref 0.7–1.2)
GFR African American: >60
GFR Non-African American: >60 mL/min/{1.73_m2} (ref >=60)
Glucose: 98 mg/dL (ref 74–109)
Potassium: 3.7 mmol/L (ref 3.5–5.0)
Sodium: 138 mmol/L (ref 132–146)
Total Bilirubin: 1.1 mg/dL (ref 0.0–1.2)
Total Protein: 7 g/dL (ref 6.4–8.3)

## 2015-07-11 LAB — CBC WITH AUTO DIFFERENTIAL
Basophils %: 0.8 % (ref 0.0–2.0)
Basophils Absolute: 0.03 E9/L (ref 0.00–0.20)
Eosinophils %: 1.1 % (ref 0.0–6.0)
Eosinophils Absolute: 0.04 E9/L — ABNORMAL LOW (ref 0.05–0.50)
Hematocrit: 37.7 % (ref 37.0–54.0)
Hemoglobin: 12.3 g/dL — ABNORMAL LOW (ref 12.5–16.5)
Immature Granulocytes #: 0.03 E9/L
Immature Granulocytes %: 0.8 % (ref 0.0–5.0)
Lymphocytes %: 27.9 % (ref 20.0–42.0)
Lymphocytes Absolute: 1.02 E9/L — ABNORMAL LOW (ref 1.50–4.00)
MCH: 27.7 pg (ref 26.0–35.0)
MCHC: 32.6 % (ref 32.0–34.5)
MCV: 84.9 fL (ref 80.0–99.9)
MPV: 10.4 fL (ref 7.0–12.0)
Monocytes %: 16.4 % — ABNORMAL HIGH (ref 2.0–12.0)
Monocytes Absolute: 0.6 E9/L (ref 0.10–0.95)
Neutrophils %: 53 % (ref 43.0–80.0)
Neutrophils Absolute: 1.94 E9/L (ref 1.80–7.30)
Platelets: 243 E9/L (ref 130–450)
RBC: 4.44 E12/L (ref 3.80–5.80)
RDW: 13.7 fL (ref 11.5–15.0)
WBC: 3.7 E9/L — ABNORMAL LOW (ref 4.5–11.5)

## 2015-07-11 MED ORDER — HEPARIN LOCK FLUSH 100 UNIT/ML IV SOLN
100 UNIT/ML | INTRAVENOUS | Status: DC | PRN
Start: 2015-07-11 — End: 2015-07-12
  Administered 2015-07-11: 14:00:00

## 2015-07-11 MED ORDER — CEFTRIAXONE SODIUM 2 G IJ SOLR
2 g | Freq: Once | INTRAMUSCULAR | Status: AC
Start: 2015-07-11 — End: 2015-07-11
  Administered 2015-07-11: 13:00:00 2 g via INTRAVENOUS

## 2015-07-11 MED ORDER — NORMAL SALINE FLUSH 0.9 % IV SOLN
0.9 % | INTRAVENOUS | Status: DC | PRN
Start: 2015-07-11 — End: 2015-07-12
  Administered 2015-07-11 (×4): via INTRAVENOUS

## 2015-07-11 MED FILL — CEFTRIAXONE SODIUM 2 G IJ SOLR: 2 g | INTRAMUSCULAR | Qty: 2

## 2015-07-11 MED FILL — NORMAL SALINE FLUSH 0.9 % IV SOLN: 0.9 % | INTRAVENOUS | Qty: 40

## 2015-07-11 MED FILL — HEPARIN LOCK FLUSH 100 UNIT/ML IV SOLN: 100 UNIT/ML | INTRAVENOUS | Qty: 5

## 2015-07-11 NOTE — Progress Notes (Signed)
Faxed [facility] lab results from 07/11/15 Georgiann Neider, RN

## 2015-07-11 NOTE — Progress Notes (Signed)
Tolerated infusion well.  Therapy plan reviewed with patient.  Verbalizes understanding.  Declines copy of AVS and any medication information, verbalizes good knowledge of current plan, and has no signs or symptoms to report at this time. Patient instructed on s/s infection/complication with PICC line to report and instructed on keeping PICC dressing clean, dry and intact patient verbalizes understanding. Next appointment scheduled.

## 2015-07-12 ENCOUNTER — Inpatient Hospital Stay: Admit: 2015-07-12 | Attending: Infectious Disease | Primary: Family Medicine

## 2015-07-12 DIAGNOSIS — N390 Urinary tract infection, site not specified: Secondary | ICD-10-CM

## 2015-07-12 MED ORDER — NORMAL SALINE FLUSH 0.9 % IV SOLN
0.9 % | INTRAVENOUS | Status: DC | PRN
Start: 2015-07-12 — End: 2015-07-13
  Administered 2015-07-12 (×3): 10 mL via INTRAVENOUS

## 2015-07-12 MED ORDER — HEPARIN LOCK FLUSH 100 UNIT/ML IV SOLN
100 UNIT/ML | INTRAVENOUS | Status: DC | PRN
Start: 2015-07-12 — End: 2015-07-13
  Administered 2015-07-12: 14:00:00 300 [IU]

## 2015-07-12 MED ORDER — CEFTRIAXONE SODIUM 2 G IJ SOLR
2 g | Freq: Once | INTRAMUSCULAR | Status: AC
Start: 2015-07-12 — End: 2015-07-12
  Administered 2015-07-12: 13:00:00 via INTRAVENOUS

## 2015-07-12 MED FILL — HEPARIN LOCK FLUSH 100 UNIT/ML IV SOLN: 100 UNIT/ML | INTRAVENOUS | Qty: 5

## 2015-07-12 MED FILL — NORMAL SALINE FLUSH 0.9 % IV SOLN: 0.9 % | INTRAVENOUS | Qty: 30

## 2015-07-12 MED FILL — CEFTRIAXONE SODIUM 2 G IJ SOLR: 2 g | INTRAMUSCULAR | Qty: 2

## 2015-07-12 NOTE — Progress Notes (Signed)
Tolerated infusion well.  Therapy plan reviewed with patient.  Verbalizes understanding.  Declines copy of AVS and any medication information, verbalizes good knowledge of current plan, and has no signs or symptoms to report at this time. Patient instructed on s/s infection/complication with PICC line to report and instructed on keeping PICC dressing clean, dry and intact patient verbalizes understanding. Next appointment scheduled.

## 2015-07-13 ENCOUNTER — Inpatient Hospital Stay: Admit: 2015-07-13 | Attending: Infectious Disease | Primary: Family Medicine

## 2015-07-13 DIAGNOSIS — N390 Urinary tract infection, site not specified: Secondary | ICD-10-CM

## 2015-07-13 MED ORDER — HEPARIN LOCK FLUSH 100 UNIT/ML IV SOLN
100 UNIT/ML | INTRAVENOUS | Status: DC | PRN
Start: 2015-07-13 — End: 2015-07-14
  Administered 2015-07-13: 14:00:00 300 [IU]

## 2015-07-13 MED ORDER — CEFTRIAXONE SODIUM 2 G IJ SOLR
2 g | Freq: Once | INTRAMUSCULAR | Status: AC
Start: 2015-07-13 — End: 2015-07-13
  Administered 2015-07-13: 13:00:00 2 g via INTRAVENOUS

## 2015-07-13 MED ORDER — NORMAL SALINE FLUSH 0.9 % IV SOLN
0.9 % | INTRAVENOUS | Status: DC | PRN
Start: 2015-07-13 — End: 2015-07-14
  Administered 2015-07-13 (×3): 10 mL via INTRAVENOUS

## 2015-07-13 MED FILL — CEFTRIAXONE SODIUM 2 G IJ SOLR: 2 g | INTRAMUSCULAR | Qty: 2

## 2015-07-13 MED FILL — HEPARIN LOCK FLUSH 100 UNIT/ML IV SOLN: 100 UNIT/ML | INTRAVENOUS | Qty: 5

## 2015-07-13 MED FILL — NORMAL SALINE FLUSH 0.9 % IV SOLN: 0.9 % | INTRAVENOUS | Qty: 30

## 2015-07-13 NOTE — Progress Notes (Signed)
Tolerated infusion well.  Therapy plan reviewed with patient.  Verbalizes understanding.  Declines copy of AVS and any medication information, verbalizes good knowledge of current plan, and has no signs or symptoms to report at this time. Patient instructed on s/s infection/complication with PICC line to report and instructed on keeping PICC dressing clean, dry and intact patient verbalizes understanding. Next appointment scheduled.

## 2015-07-14 ENCOUNTER — Inpatient Hospital Stay: Admit: 2015-07-14 | Attending: Infectious Disease | Primary: Family Medicine

## 2015-07-14 DIAGNOSIS — N390 Urinary tract infection, site not specified: Secondary | ICD-10-CM

## 2015-07-14 MED ORDER — NORMAL SALINE FLUSH 0.9 % IV SOLN
0.9 % | INTRAVENOUS | Status: DC | PRN
Start: 2015-07-14 — End: 2015-07-15
  Administered 2015-07-14 (×3): 10 mL via INTRAVENOUS

## 2015-07-14 MED ORDER — HEPARIN LOCK FLUSH 100 UNIT/ML IV SOLN
100 UNIT/ML | INTRAVENOUS | Status: DC | PRN
Start: 2015-07-14 — End: 2015-07-15
  Administered 2015-07-14: 14:00:00 300 [IU]

## 2015-07-14 MED ORDER — CEFTRIAXONE SODIUM 2 G IJ SOLR
2 g | Freq: Once | INTRAMUSCULAR | Status: AC
Start: 2015-07-14 — End: 2015-07-14
  Administered 2015-07-14: 13:00:00 via INTRAVENOUS

## 2015-07-14 MED FILL — CEFTRIAXONE SODIUM 2 G IJ SOLR: 2 g | INTRAMUSCULAR | Qty: 2

## 2015-07-14 MED FILL — HEPARIN LOCK FLUSH 100 UNIT/ML IV SOLN: 100 UNIT/ML | INTRAVENOUS | Qty: 5

## 2015-07-14 MED FILL — NORMAL SALINE FLUSH 0.9 % IV SOLN: 0.9 % | INTRAVENOUS | Qty: 10

## 2015-07-14 NOTE — Progress Notes (Signed)
Tolerated infusion well.  Therapy plan reviewed with patient.  Verbalizes understanding.  Declines copy of AVS and any medication information, verbalizes good knowledge of current plan, and has no signs or symptoms to report at this time. Patient instructed on s/s infection/complication with PICC line to report and instructed on keeping PICC dressing clean, dry and intact patient verbalizes understanding. Next appointment scheduled.

## 2015-07-15 ENCOUNTER — Inpatient Hospital Stay: Admit: 2015-07-15 | Attending: Infectious Disease | Primary: Family Medicine

## 2015-07-15 DIAGNOSIS — N390 Urinary tract infection, site not specified: Secondary | ICD-10-CM

## 2015-07-15 LAB — CBC WITH AUTO DIFFERENTIAL
Basophils %: 0.7 % (ref 0.0–2.0)
Basophils Absolute: 0.03 E9/L (ref 0.00–0.20)
Eosinophils %: 2 % (ref 0.0–6.0)
Eosinophils Absolute: 0.09 E9/L (ref 0.05–0.50)
Hematocrit: 38.5 % (ref 37.0–54.0)
Hemoglobin: 12.7 g/dL (ref 12.5–16.5)
Immature Granulocytes #: 0.01 E9/L
Immature Granulocytes %: 0.2 % (ref 0.0–5.0)
Lymphocytes %: 33.6 % (ref 20.0–42.0)
Lymphocytes Absolute: 1.53 E9/L (ref 1.50–4.00)
MCH: 28.2 pg (ref 26.0–35.0)
MCHC: 33 % (ref 32.0–34.5)
MCV: 85.4 fL (ref 80.0–99.9)
MPV: 10.6 fL (ref 7.0–12.0)
Monocytes %: 6.4 % (ref 2.0–12.0)
Monocytes Absolute: 0.29 E9/L (ref 0.10–0.95)
Neutrophils %: 57.1 % (ref 43.0–80.0)
Neutrophils Absolute: 2.6 E9/L (ref 1.80–7.30)
Platelets: 187 E9/L (ref 130–450)
RBC: 4.51 E12/L (ref 3.80–5.80)
RDW: 13.7 fL (ref 11.5–15.0)
WBC: 4.6 E9/L (ref 4.5–11.5)

## 2015-07-15 LAB — COMPREHENSIVE METABOLIC PANEL
ALT: 21 U/L (ref 0–40)
AST: 23 U/L (ref 0–39)
Albumin: 3.9 g/dL (ref 3.5–5.2)
Alkaline Phosphatase: 83 U/L (ref 40–129)
Anion Gap: 13 mmol/L (ref 7–16)
BUN: 21 mg/dL (ref 8–23)
CO2: 25 mmol/L (ref 22–29)
Calcium: 9.2 mg/dL (ref 8.6–10.2)
Chloride: 101 mmol/L (ref 98–107)
Creatinine: 0.8 mg/dL (ref 0.7–1.2)
GFR African American: >60
GFR Non-African American: >60 mL/min/{1.73_m2} (ref >=60)
Glucose: 98 mg/dL (ref 74–109)
Potassium: 3.6 mmol/L (ref 3.5–5.0)
Sodium: 139 mmol/L (ref 132–146)
Total Bilirubin: 1.1 mg/dL (ref 0.0–1.2)
Total Protein: 7 g/dL (ref 6.4–8.3)

## 2015-07-15 LAB — SEDIMENTATION RATE: Sed Rate: 26 mm/Hr — ABNORMAL HIGH (ref 0–15)

## 2015-07-15 LAB — C-REACTIVE PROTEIN: CRP: 0.3 mg/dL (ref 0.0–0.4)

## 2015-07-15 MED ORDER — CEFTRIAXONE SODIUM 2 G IJ SOLR
2 g | Freq: Once | INTRAMUSCULAR | Status: AC
Start: 2015-07-15 — End: 2015-07-15
  Administered 2015-07-15: 13:00:00 via INTRAVENOUS

## 2015-07-15 MED ORDER — NORMAL SALINE FLUSH 0.9 % IV SOLN
0.9 % | INTRAVENOUS | Status: DC | PRN
Start: 2015-07-15 — End: 2015-07-16
  Administered 2015-07-15 (×4): 10 mL via INTRAVENOUS

## 2015-07-15 MED ORDER — HEPARIN SOD (PORK) LOCK FLUSH 100 UNIT/ML IV SOLN
100 UNIT/ML | INTRAVENOUS | Status: DC | PRN
Start: 2015-07-15 — End: 2015-07-16
  Administered 2015-07-15: 14:00:00

## 2015-07-15 MED FILL — HEPARIN LOCK FLUSH 100 UNIT/ML IV SOLN: 100 UNIT/ML | INTRAVENOUS | Qty: 5

## 2015-07-15 MED FILL — NORMAL SALINE FLUSH 0.9 % IV SOLN: 0.9 % | INTRAVENOUS | Qty: 40

## 2015-07-15 MED FILL — CEFTRIAXONE SODIUM 2 G IJ SOLR: 2 g | INTRAMUSCULAR | Qty: 2

## 2015-07-15 NOTE — Progress Notes (Signed)
Labs faxed to [facility]

## 2015-07-15 NOTE — Progress Notes (Signed)
Tolerated infusion well.  Therapy plan reviewed with patient.  Verbalizes understanding.  Declines copy of AVS and any medication information, verbalizes good knowledge of current plan, and has no signs or symptoms to report at this time. Patient instructed on s/s infection/complication with PICC line to report and instructed on keeping PICC dressing clean, dry and intact patient verbalizes understanding. Next appointment scheduled.

## 2015-07-16 ENCOUNTER — Inpatient Hospital Stay: Admit: 2015-07-16 | Attending: Infectious Disease | Primary: Family Medicine

## 2015-07-16 DIAGNOSIS — N390 Urinary tract infection, site not specified: Secondary | ICD-10-CM

## 2015-07-16 MED ORDER — HEPARIN SOD (PORK) LOCK FLUSH 100 UNIT/ML IV SOLN
100 UNIT/ML | INTRAVENOUS | Status: DC | PRN
Start: 2015-07-16 — End: 2015-07-17
  Administered 2015-07-16: 14:00:00

## 2015-07-16 MED ORDER — NORMAL SALINE FLUSH 0.9 % IV SOLN
0.9 % | INTRAVENOUS | Status: DC | PRN
Start: 2015-07-16 — End: 2015-07-17
  Administered 2015-07-16 (×3): 10 mL via INTRAVENOUS

## 2015-07-16 MED ORDER — DEXTROSE 5 % IV SOLN (MINI-BAG)
5 % | Freq: Once | INTRAVENOUS | Status: AC
Start: 2015-07-16 — End: 2015-07-16
  Administered 2015-07-16: 14:00:00 2 g via INTRAVENOUS

## 2015-07-16 MED FILL — CEFTRIAXONE SODIUM 2 G IJ SOLR: 2 g | INTRAMUSCULAR | Qty: 2

## 2015-07-16 NOTE — Progress Notes (Signed)
Tolerated infusion well.  Therapy plan reviewed with patient.  Verbalizes understanding.  Declines copy of AVS and any medication information, verbalizes good knowledge of current plan, and has no signs or symptoms to report at this time. Patient instructed on s/s infection/complication with PICC line to report and instructed on keeping PICC dressing clean, dry and intact patient verbalizes understanding. Next appointment scheduled.

## 2015-07-17 ENCOUNTER — Inpatient Hospital Stay: Attending: Infectious Disease | Primary: Family Medicine

## 2015-07-17 ENCOUNTER — Encounter: Admit: 2015-07-17 | Primary: Family Medicine

## 2015-07-17 DIAGNOSIS — N390 Urinary tract infection, site not specified: Secondary | ICD-10-CM

## 2015-07-17 MED ORDER — CEFTRIAXONE SODIUM 2 G IJ SOLR
2 g | Freq: Once | INTRAMUSCULAR | Status: AC
Start: 2015-07-17 — End: 2015-07-17
  Administered 2015-07-17: 13:00:00 2 g via INTRAVENOUS

## 2015-07-17 MED ORDER — NORMAL SALINE FLUSH 0.9 % IV SOLN
0.9 % | INTRAVENOUS | Status: AC | PRN
Start: 2015-07-17 — End: 2015-07-18
  Administered 2015-07-17 (×3): 10 mL via INTRAVENOUS

## 2015-07-17 MED ORDER — HEPARIN SOD (PORK) LOCK FLUSH 100 UNIT/ML IV SOLN
100 UNIT/ML | INTRAVENOUS | Status: AC | PRN
Start: 2015-07-17 — End: 2015-07-18
  Administered 2015-07-17: 14:00:00 300 [IU]

## 2015-07-17 MED FILL — NORMAL SALINE FLUSH 0.9 % IV SOLN: 0.9 % | INTRAVENOUS | Qty: 30

## 2015-07-17 MED FILL — HEPARIN LOCK FLUSH 100 UNIT/ML IV SOLN: 100 UNIT/ML | INTRAVENOUS | Qty: 5

## 2015-07-17 MED FILL — CEFTRIAXONE SODIUM 2 G IJ SOLR: 2 g | INTRAMUSCULAR | Qty: 2

## 2015-07-17 NOTE — Progress Notes (Signed)
Tolerated infusion well.  Therapy plan reviewed with patient.  Verbalizes understanding.  Declines copy of AVS and any medication information, verbalizes good knowledge of current plan, and has no signs or symptoms to report at this time. Patient instructed on s/s infection/complication with PICC line to report and instructed on keeping PICC dressing clean, dry and intact patient verbalizes understanding. Next appointment scheduled.

## 2015-07-18 ENCOUNTER — Inpatient Hospital Stay: Admit: 2015-07-18 | Attending: Infectious Disease | Primary: Family Medicine

## 2015-07-18 DIAGNOSIS — N390 Urinary tract infection, site not specified: Secondary | ICD-10-CM

## 2015-07-18 MED ORDER — HEPARIN LOCK FLUSH 100 UNIT/ML IV SOLN
100 UNIT/ML | INTRAVENOUS | Status: DC | PRN
Start: 2015-07-18 — End: 2015-07-18

## 2015-07-18 MED ORDER — DEXTROSE 5 % IV SOLN (MINI-BAG)
5 % | Freq: Once | INTRAVENOUS | Status: AC
Start: 2015-07-18 — End: 2015-07-18
  Administered 2015-07-18: 13:00:00 via INTRAVENOUS

## 2015-07-18 MED ORDER — NORMAL SALINE FLUSH 0.9 % IV SOLN
0.9 % | INTRAVENOUS | Status: DC | PRN
Start: 2015-07-18 — End: 2015-07-18
  Administered 2015-07-18 (×3): 10 mL via INTRAVENOUS

## 2015-07-18 MED ORDER — HEPARIN SOD (PORK) LOCK FLUSH 100 UNIT/ML IV SOLN
100 UNIT/ML | INTRAVENOUS | Status: AC
Start: 2015-07-18 — End: 2015-07-18

## 2015-07-18 MED FILL — CEFTRIAXONE SODIUM 2 G IJ SOLR: 2 g | INTRAMUSCULAR | Qty: 2

## 2015-07-18 MED FILL — HEPARIN LOCK FLUSH 100 UNIT/ML IV SOLN: 100 UNIT/ML | INTRAVENOUS | Qty: 5

## 2015-07-18 MED FILL — NORMAL SALINE FLUSH 0.9 % IV SOLN: 0.9 % | INTRAVENOUS | Qty: 30

## 2015-07-18 NOTE — Progress Notes (Signed)
Tolerated infusion well.  Therapy plan compllete  Verbalizes understanding.  Declines copy of AVS and any medication information, verbalizes good knowledge of current plan, and has no signs or symptoms to report at this time.A peripherally-inserted central catheter is often called a PICC. It is a long, thin, flexible intravenous (I.V.) line or catheter. The catheter is placed into a small vein in your upper arm. It is moved forward until it is in a larger vein near your heart.    INSTRUCTIONS AFTER PICC REMOVED     After the 24 hours is up, the dressing may be removed. The PICC insertion site is very small. A small scab may develop over the insertion site. It is okay to wash the site gently with soap and water. Be careful to not remove or pick the scab off. After washing, gently pat the site dry. You do not need to put another dressing over the insertion site after you wash it.   Avoid heavy, strenuous physical activity for 24 hours after the PICC is removed. This includes things like:   Weight lifting.   Strenuous yard work.   Any physical activity with repetitive arm movement....      When do I need to call the doctor?   Signs of infection. These include a fever of 100.4??F (38??C) or higher, chills.  Signs of infection where PICC was put in. These include swelling, redness, warmth around the site, too much pain when touched, yellowish or greenish or bloody discharge, foul smell coming from the site.  Swelling and pain in the arm where the PICC was put in. This might mean there is a blood clot in your arm.  Bleeding that won't stop from the insertion site. Apply pressure, elevate arm above the level of the heart. If bleeding does not stop call you physician or go to the nearest Emergency Room.

## 2015-07-27 LAB — CULTURE, URINE

## 2015-07-28 LAB — CULTURE, URINE

## 2015-09-30 ENCOUNTER — Ambulatory Visit
Admit: 2015-09-30 | Discharge: 2015-09-30 | Payer: MEDICARE | Attending: Critical Care Medicine | Primary: Family Medicine

## 2015-09-30 DIAGNOSIS — G4733 Obstructive sleep apnea (adult) (pediatric): Secondary | ICD-10-CM

## 2015-09-30 LAB — SPIROMETRY WITHOUT BRONCHODILATOR
FEF 25-75% PRE PRED: 85
FEF 25-75%-Pre: 1.86
FEV1 %Pred-Pre: 81
FEV1/FVC Pred: 101
FEV1/FVC: 76 %
FEV1: 2.4 L
FEV3 PRE: 2.96
FVC %Pred-Pre: 80
FVC: 3.16 L
PEF-Pre: 488.2 L/sec

## 2015-09-30 LAB — POCT NITRIC OXIDE: Parts Per Billion: 22

## 2015-09-30 NOTE — Progress Notes (Signed)
[facility]    Department of Internal Medicine  Division of Pulmonary, Critical Care & Sleep Medicine    [name], MD    We had the pleasure of seeing  KEAGON GLASCOE in the [facility] regarding his chronic sinusitis, GERD S/p Nissen, reactive airway disease & obstructive sleep apnea.     HISTORY OF PRESENT ILLNESS:    BASTION BOLGER is a 75 y.o. year old male who has a long standing history of allergies, elevated Ig E level around 600 requiring the administration of Xolair therapy. 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 Last level in 06/2010 was negative. The her recall He under when endoscopic surgery on 06/30/07 antrostomy bilateral with ethmoidectomies and left single sphenoidotomies and septoplasty. His treatment is working well, which included nasal rinses, Patanase, Astelin, and Xolair. I will  With his sleep apnea he is very compliant using the BiPAP machine nightly for at least 6 hour. His current pressure are 18/ 9 cm H20.     Since the last visit Catcher is doing well. We discussed for along time the decline in his wife's memory and health. She is doing to a Alz unit three times a week. At the last visit he was found to have a elevated PSA and a recent biopsy done showed prostate cancer [name] score 6). He is holding on treatment.  He has had no pulmonary complaint.  He has been compliant with his CPAP and uses it nightly. His ACT is 22/25. He has no CP, SOB or leg edema. No neurologic symptoms. He has long standing GERD requiring anti-reflux surgery years ago. His last EGD which was negative by Dr. Lavone Nian he has had no repeat complaints. His antireflux surgery anastomosis was stable and clear.  He will return in a 6 months.         ALLERGIES:    Allergies   Allergen Reactions   ??? Enalapril Swelling     Of lower lip   ??? Esomeprazole Hives and Swelling   ??? Nexium [Esomeprazole Magnesium Trihydrate] Swelling   ??? Nsaids Swelling   ??? Ranitidine Hives and Swelling   ??? Reglan [Metoclopramide Hcl] Other (See Comments)     States has bloating with this       PAST MEDICAL HISTORY:       Diagnosis Date   ??? Allergy    ??? Asthma     doing well per patient   ??? Chronic pansinusitis 10/03/2014   ??? CSS (Churg-Strauss syndrome) (HCC)     no current issues   ??? Diabetes mellitus (HCC)     patient denies   ??? Elevated PSA     negative biospy   ??? GERD (gastroesophageal reflux disease)    ??? Hyperlipidemia    ??? OSA treated with BiPAP 03/27/2014   ??? Sinusitis    ??? Sleep apnea     CH[facility] 18/9        MEDICATIONS:   Current Outpatient Prescriptions   Medication Sig Dispense Refill   ??? finasteride (PROSCAR) 5 MG tablet Take 5 mg by mouth daily States he will not take until he is done with his daily antibiotics     ??? colestipol (COLESTID) 1 G tablet Take 1 g by mouth 2  times daily     ??? amLODIPine (NORVASC) 2.5 MG tablet Take 2.5 mg by mouth nightly     ??? hydrochlorothiazide (HYDRODIURIL) 25 MG tablet Take 25 mg by mouth daily.       ??? atorvastatin (LIPITOR) 80 MG tablet Take 80 mg by mouth daily.     ??? XOPENEX HFA 45 MCG/ACT inhaler Inhale 1 puff into the lungs every 8 hours as needed for Wheezing. 1 Inhaler 12     No current facility-administered medications for this visit.        SOCIAL AND OCCUPATIONAL HEALTH:  He is married with four children. One son died from HF, daughter died from CAD all yound. Wife has dementia (advanced). The patient is a never smoked  There is not history of TB or TB exposure.  No Etoh Abuse.  There is not asbestos or silica dust exposure.  The patient reports no coal, foundry, quarry or [employer] exposure.  Travel history reveals no trips.  Denies history of recreational or IV drug use. Denies hot tub exposure. The patient has no pets.  Hobbies include watching soap opera.    Social History   Substance Use Topics   ??? Smoking status: Never Smoker   ??? Smokeless tobacco: Never Used   ??? Alcohol use No       SURGICAL HISTORY:   Past Surgical History:   Procedure Laterality Date   ??? ENDOSCOPY, COLON, DIAGNOSTIC  01/22/2014   ??? GASTRIC FUNDOPLICATION  2005   ??? HERNIA REPAIR     ??? NASAL POLYP SURGERY     ??? PROSTATE BIOPSY  2011    negative pathology   ??? SEPTOPLASTY     ??? TUMOR REMOVAL  11/2010    fatty tumor removed Dr Lavone NianAwad   ??? UPPER GASTROINTESTINAL ENDOSCOPY  12/2009    with biopsy, pathology negative       FAMILY HISTORY:   Family History   Problem Relation Age of Onset   ??? High Blood Pressure Father    ??? High Cholesterol Father    ??? Asthma Sister    ??? Diabetes Sister        REVIEW OF SYSTEMS:   Constitutional: General health good . No weight changes.  No fevers, fatigue or weakness.   Head: Patient denies any history of trauma, convulsive disorder or syncope.    Skin:  Patient denies history of changes in pigmentation, eruptions or pruritus.  No easy bruising or bleeding.  EENT: Patient denies any history of color blindness, photophobia, diplopia, inflammation, cataracts or glaucoma.   Patient denies history of deafness, tinnitus, pain, discharge or recurrent infections.    Patient has a long history of rhinitis, chronic nasal discharge, drainage, & nasal polyps; all requiring surgery. Patient denies history of soreness of mouth or tongue.   Patient denies history of hoarseness, voice changes, sore throats or tonsillitis.    Lymphatic:  Patient denies history of enlargement, inflammation, pain or suppuration.    Cardiovascular:  Patient denies history of palpations, heart murmur, irregular rhythm, chest pain, exertional dyspnea, cyanosis, ascites, rheumatic fever, cold extremities or edema.  Respiration:  Patient denies wheezing, dyspnea, nocturnal dyspnea, cough, hemoptysis, pleurisy, TB or asthma.   Known OSA on [place] 19/ 8 cm H20. (+) P ANCA possible   - Churg Strauss Sydrome.   Gastrointestinal: Patient denies changes in appetite. No dysphagia, odynophagia or abdominal pain.  No hematochezia, melena, bowel habit changes or hemorrhoids.    No jaundice. GERD Gastic surgery  in past with poor results.   Genitourinary:   Patient denies dysuria, frequency, urgency or incontinence.  Complains of difficulty in starting or stopping urinary stream.    Reports history of stones which is followed by [facility].  He has benign prostatic hypertrophy. (2017) prostate cancer. + Cancer Gleeson 6 + Biopsy.   Musculoskeletal: The patient denies history of arthritis or joint pains.  No loss of strength or dislocation.  Breasts:  No history of masses, lumps, pain, nipple changes or painful nipples.  Neurological: Patient denies vertigo, syncope.  No twitching or memory.   Psychological: Patient denies moodiness, depression or anxiety.  No obsessions or hallucinations.    Endocrine:  No history of goiter, exophthalmos or dryness of skin.  No polyuria or polyphagia.  No diabetes.   Hematopoietic:  No history of bleeding disorders or easy bruising.  Rheumatic:  No polyarthritis or inflammatory joint disease. History of positive P-ANCA    PHYSICAL EXAMINATION:  Vitals:    09/30/15 1343   BP: (!) 161/74   Site: Left Arm   Position: Sitting   Cuff Size: Medium Adult   Pulse: 58   Resp: 14   SpO2: 97%   Weight: 189 lb (85.7 kg)   Height:  (1.753 m)     Constitutional: A 75 y.o.  male  who is alert, oriented, cooperative and in no apparent distress.  Head was normocephalic and atraumatic.  Male patten baldness.   EENT: Eyes were equal and reactive to light and accommodation.  Extraocular muscles intact.  No conjunctival injections.  External canals are patent.   No discharge was appreciated.  Septum was mildly deviated, mucosa was without erythema, exudates or cobblestoning.  No thrush was noted.  Swollen turbinates.   Neck: Supple without thyromegaly. No elevated JVP. Trachea was  midline. No carotid bruits.    Respiratory: Symmetrical without dullness to percussion.  Breath sounds bilaterally were clear to auscultation.   No wheezes, rhonchi or rales. No intercostal retraction or use of accessory muscles. No egophony noted.   Cardiovascular: Regular without murmur, clicks, gallops or rubs.  No left or right ventricular heave.    Pulses:  Equal bilaterally.    Abdomen: Slightly rounded, soft without organomegaly. No rebound, rigidity or guarding.  Surgical (+) scars  Lymphatic: No lymphadenopathy.  Musculoskeletal:No gross muscle weakness.    Extremities:  No lower extremity edema.  Muscle size, tone and strength are normal.    Sensory function appears intact.  Deep tendon reflexes are normal.   Skin:  Warm and dry.  Good color, turgor and pigmentation. No lesions. Varicoties noted. No rash.   Neurological/Psychiatric: The patient's general behavior, level of consciousness, thought content and emotional status is normal.  Cranial nerves II-XII are intact.      DATA:     Today Spirometry compared to previous demonstrates an FVC of 3.30  which is 80 % of predicted with an FEV1 of 2.63 liters which is 87 % of predicted.  FEV1/FVC ratio is 80%.  Maximum voluntary ventilation is 128 liters per minute or 99 % of predicted.  Flow volume loop shows no signs of intrathoracic or extrathoracic obstruction. Impression. Normal.    Asthma Control Test [ACT] is a 5 part questionnaire, which has a point value system that ranges from 1- 5. This test is a validated questionnaire to help determine asthma control.  A total score of 19 or below indicates that the patients asthma may not be well controlled. If used in  conjunction with PFTs, the correlation of determining asthma control is even stronger.  Today on testing the ACT was 23 out of max of 25. Impression: Poor control.     Epworth Sleepiness Scale is use this Tool to Measure Sleep Deprivation. The Epworth Sleepiness Scale was developed by researchers in  [place] and is widely used by sleep professionals around the world to measure sleep deprivation.  How likely are you to doze off or fall asleep in the following situations, in contrast to feeling just tired? This refers to your usual way of life in recent times.  Results: Total Epworth Score: 3  Interpretation: No hypersomnolence.    IMPRESSION:      Rana SnareVincent J Accomando a 75 y.o. male reactive airway disease, chronic cough from GERD S/p Nissen and severe nasal polyposis S/p surgery x 2. While we have never proven the systemic vasculitis or necrotizing vasculitis part and he has no renal, neuropathic or cardiovascular involvement; the finding of chronic sinusitis, eosinophilia, pANCA (+) and asthma aid in the diagnosis.  He has no neuropathy or neuropathic mononeuritis. His p-ANCA was 1:160 but fluctuates .  However, the clinical utility of ANCA in CSS raises a controversial issue of the sensitivity and specificity of ANCA in systemic vasculitis. In CSS P-ANCA is 50%.  He should never receive leukotriene modifier, allopurinol, propylthiouracil and quinine with this history of CSS for it will cause active flairs.  SO we have been monitoring and he is without flair or recurrence = false + test.  With his chronic allergies, sinusitis with rhinitis and nasal  polyposis is stable after surgery. He also has a failed Nissen fundoplication with chronic gastroparesis and GERD stable based on last. As for his sleep apnea he is doing very well with his BiPAP.  With this in mind, we would like to proceed with the following;                PLAN:    It is always good to see Vincet. He is still walking miles in the morning or at night when his wife is asleep. Most of the time is spent caring from his wife whom has dementia.  We have stopped the Xolair for his allergies and elevated IgE level and he is without a decline. Sleep apnea symptoms are excellent and we see no need to adjust therapy.  We discussed the death of his son and  daughter and declining health of his wife (dementia).  He will follow up in 6 months to a year for re-evaluation.  We hope this updates you on my evaluation and clinical thinking. Thank you for allowing us to participate in Rana SnareVincent J Carrier care.     Sincerely,    Elmer Rampimothy J. Aiyla Baucom, D.O., MPH, Ane PaymentFCCP, FACOI, FACP  Associate Professor of Medicine  Director, [facility]

## 2015-09-30 NOTE — Patient Instructions (Addendum)
Learning About CPAP for Sleep Apnea  What is CPAP?    CPAP is a small machine that you use at home every night while you sleep. It increases air pressure in your throat to keep your airway open. When you have sleep apnea, this can help you sleep better so you feel much better. CPAP stands for "continuous positive airway pressure."  The CPAP machine will have one of the following:  ?? A mask that covers your nose and mouth  ?? Prongs that fit into your nose  ?? A mask that covers your nose only, the most common type. This type is called NCPAP. The N stands for "nasal."  Why is it done?  CPAP is usually the best treatment for obstructive sleep apnea. It is the first treatment choice and the most widely used. Your doctor may suggest CPAP if you have:  ?? Moderate to severe sleep apnea.  ?? Sleep apnea and coronary artery disease (CAD) or heart failure.  How does it help?  ?? CPAP can help you have more normal sleep, so you feel less sleepy and more alert during the daytime.  ?? CPAP may help keep heart failure or other heart problems from getting worse.  ?? CPAP may help lower your blood pressure.  ?? If you use CPAP, your bed partner may also sleep better because you are not snoring or restless.  What are the side effects?  Some people who use CPAP have:  ?? A dry or stuffy nose and a sore throat.  ?? Irritated skin on the face.  ?? Sore eyes.  ?? Bloating.  If you have any of these problems, work with your doctor to fix them. Here are some things you can try:  ?? Be sure the mask or nasal prongs fit well.  ?? See if your doctor can adjust the pressure of your CPAP.  ?? If your nose is dry, try a humidifier.  ?? If your nose is runny or stuffy, try decongestant medicine or a steroid nasal spray. Be safe with medicines. Read and follow all instructions on the label. Do not use the medicine longer than the label says.  If these things do not help, you might try a different type of machine. Some machines have air pressure that  adjusts on its own. Others have air pressures that are different when you breathe in than when you breathe out. This may reduce discomfort caused by too much pressure in your nose.  Where can you learn more?  Go to [email or web address] and sign in to your MyChart account. Enter X266 in the Search Health Information box to learn more about "Learning About CPAP for Sleep Apnea."     If you do not have an account, please click on the "Sign Up Now" link.  Current as of: Nov 05, 2014  Content Version: 11.2  ?? 2006-2017 [employer]. Care instructions adapted under license by [facility]. If you have questions about a medical condition or this instruction, always ask your healthcare professional. [employer], Inc[employer] disclaims any warranty or liability for your use of this information.

## 2015-09-30 NOTE — Progress Notes (Signed)
Patient to follow up with physician in 6 months.

## 2016-01-20 NOTE — Telephone Encounter (Signed)
Patient needs rescheduled due to Dr Buelah [facility]Bar[facility] day he's to be seen.

## 2016-01-21 ENCOUNTER — Telehealth

## 2016-01-21 NOTE — Telephone Encounter (Signed)
Patient called in stating that his condo complex is testing for radon and that his unit the radon levels were high.  He was asking if he should have a chest ct.  Dr. Buelah ManisBarreiro notified and  Chest CT as well as requesting testing documentation of the levels from the patient.  Patient notified by Candis ShineKelly Taylor MA

## 2016-01-27 NOTE — Telephone Encounter (Signed)
error 

## 2016-01-30 ENCOUNTER — Inpatient Hospital Stay: Admit: 2016-01-30 | Attending: Critical Care Medicine | Primary: Family Medicine

## 2016-01-30 DIAGNOSIS — J45909 Unspecified asthma, uncomplicated: Secondary | ICD-10-CM

## 2016-02-11 NOTE — Telephone Encounter (Signed)
Patient called and notified that his Chest CT is normal.

## 2016-02-11 NOTE — Telephone Encounter (Signed)
-----   Message from Rolin BarryMarjorie Glunt sent at 02/11/2016 11:06 AM EDT -----  Regarding: wants results of his chest xray  Contact: [phone]  Please call patient as soon as you can to give him xray results.    ty

## 2016-02-11 NOTE — Telephone Encounter (Signed)
Left a VM, told pt that his Chest CT came back normal. Told him if he has any questions, please feel free to call.

## 2016-04-16 ENCOUNTER — Encounter: Attending: Critical Care Medicine | Primary: Family Medicine

## 2016-04-30 ENCOUNTER — Ambulatory Visit
Admit: 2016-04-30 | Discharge: 2016-04-30 | Payer: MEDICARE | Attending: Critical Care Medicine | Primary: Family Medicine

## 2016-04-30 DIAGNOSIS — J45909 Unspecified asthma, uncomplicated: Secondary | ICD-10-CM

## 2016-04-30 LAB — SPIROMETRY WITHOUT BRONCHODILATOR
FEF 25-75% PRE PRED: 109
FEF 25-75%-Pre: 2.28
FEV1 %Pred-Pre: 103
FEV1/FVC Pred: 99
FEV1/FVC: 76 %
FEV1: 2.89 L
FEV3 PRE: 3.6
FVC %Pred-Pre: 102
FVC: 3.8 L

## 2016-04-30 LAB — POCT NITRIC OXIDE: Parts Per Billion: 21

## 2016-04-30 NOTE — Progress Notes (Signed)
[facility]    Department of Internal Medicine  Division of Pulmonary, Critical Care & Sleep Medicine    Dear Nechama GuardScott M Agnew, MD    We had the pleasure of seeing  [facility] regarding his chronic sinusitis, GERD S/p Nissen, reactive airway disease & obstructive sleep apnea.     HISTORY OF PRESENT ILLNESS:    Julian Castaneda is a 75 y.o. year old male who has a long standing history of allergies, elevated Ig E level around 600 requiring the administration of Xolair therapy. 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 Last level in 06/2010 was negative. The her recall Julian Castaneda under when endoscopic surgery on 06/30/07 antrostomy bilateral with ethmoidectomies and left single sphenoidotomies and septoplasty. His treatment is working well, which included nasal rinses, Patanase, Astelin, and Xolair. I will  With his sleep apnea Julian Castaneda is very compliant using the BiPAP machine nightly for at least 6 hour. His current pressure are 18/ 9 cm H20.     Since the last visit Julian Castaneda is doing well. We discussed for along time the decline in his wife's memory and health. She is doing to a Alz unit three times a week. At the last visit Julian Castaneda was found to have a elevated PSA and a recent biopsy done showed prostate cancer [name](Gleeson score 6). Julian Castaneda is holding on treatment.  Julian Castaneda has had no pulmonary complaint.  Julian Castaneda has been compliant with his CPAP and uses it nightly. His ACT is 22/25. Julian Castaneda has no CP, SOB or leg edema. No neurologic symptoms. Julian Castaneda has long standing GERD requiring anti-reflux surgery years ago. His last EGD which was negative by [name] Julian Castaneda has had no repeat complaints. His antireflux surgery anastomosis was stable and clear.  Julian Castaneda will return in a 6 months.         ALLERGIES:    Allergies   Allergen Reactions   ??? Enalapril Swelling     Of lower lip   ??? Esomeprazole Hives and Swelling   ??? Nexium [Esomeprazole Magnesium Trihydrate] Swelling   ??? Nsaids Swelling   ??? Ranitidine Hives and Swelling   ??? Reglan [Metoclopramide Hcl] Other (See Comments)     States has bloating with this       PAST MEDICAL HISTORY:       Diagnosis Date   ??? Allergy    ??? Asthma     doing well per patient   ??? Chronic pansinusitis 10/03/2014   ??? CSS (Churg-Strauss syndrome) (HCC)     no current issues   ??? Diabetes mellitus (HCC)     patient denies   ??? Elevated PSA     negative biospy   ??? GERD (gastroesophageal reflux disease)    ??? Hyperlipidemia    ??? OSA treated with BiPAP 03/27/2014   ??? Sinusitis    ??? Sleep apnea     CH[facility] 18/9        MEDICATIONS:   Current Outpatient Prescriptions   Medication Sig Dispense Refill   ??? finasteride (PROSCAR) 5 MG tablet Take 5 mg by mouth daily States Julian Castaneda will not take until Julian Castaneda is done with his daily antibiotics     ??? colestipol (COLESTID) 1 G tablet Take 1 g by mouth 2  times daily     ??? amLODIPine (NORVASC) 2.5 MG tablet Take 2.5 mg by mouth nightly     ??? hydrochlorothiazide (HYDRODIURIL) 25 MG tablet Take 25 mg by mouth daily.       ??? atorvastatin (LIPITOR) 80 MG tablet Take 80 mg by mouth daily.     ??? XOPENEX HFA 45 MCG/ACT inhaler Inhale 1 puff into the lungs every 8 hours as needed for Wheezing. 1 Inhaler 12     No current facility-administered medications for this visit.        SOCIAL AND OCCUPATIONAL HEALTH:  Julian Castaneda is married with four children. One son died from HF, daughter died from CAD all yound. Wife has dementia (advanced). The patient is a never smoked  There is not history of TB or TB exposure.  No Etoh Abuse.  There is not asbestos or silica dust exposure.  The patient reports no coal, foundry, quarry or [employer] exposure.  Travel history reveals no trips.  Denies history of recreational or IV drug use. Denies hot tub exposure. The patient has no pets.  Hobbies include watching soap opera.    Social History   Substance Use Topics   ??? Smoking status: Never Smoker   ??? Smokeless tobacco: Never Used   ??? Alcohol use No       SURGICAL HISTORY:   Past Surgical History:   Procedure Laterality Date   ??? ENDOSCOPY, COLON, DIAGNOSTIC  01/22/2014   ??? GASTRIC FUNDOPLICATION  2005   ??? HERNIA REPAIR     ??? NASAL POLYP SURGERY     ??? PROSTATE BIOPSY  2011    negative pathology   ??? SEPTOPLASTY     ??? TUMOR REMOVAL  11/2010    fatty tumor removed Dr Lavone NianAwad   ??? UPPER GASTROINTESTINAL ENDOSCOPY  12/2009    with biopsy, pathology negative       FAMILY HISTORY:   Family History   Problem Relation Age of Onset   ??? High Blood Pressure Father    ??? High Cholesterol Father    ??? Asthma Sister    ??? Diabetes Sister        REVIEW OF SYSTEMS:   Constitutional: General health good . No weight changes.  No fevers, fatigue or weakness.   Head: Patient denies any history of trauma, convulsive disorder or syncope.    Skin:  Patient denies history of changes in pigmentation, eruptions or pruritus.  No easy bruising or bleeding.  EENT: Patient denies any history of color blindness, photophobia, diplopia, inflammation, cataracts or glaucoma.   Patient denies history of deafness, tinnitus, pain, discharge or recurrent infections.    Patient has a long history of rhinitis, chronic nasal discharge, drainage, & nasal polyps; all requiring surgery. Patient denies history of soreness of mouth or tongue.   Patient denies history of hoarseness, voice changes, sore throats or tonsillitis.    Lymphatic:  Patient denies history of enlargement, inflammation, pain or suppuration.    Cardiovascular:  Patient denies history of palpations, heart murmur, irregular rhythm, chest pain, exertional dyspnea, cyanosis, ascites, rheumatic fever, cold extremities or edema.  Respiration:  Patient denies wheezing, dyspnea, nocturnal dyspnea, cough, hemoptysis, pleurisy, TB or asthma.   Known OSA on [place] 19/ 8 cm H20. (+) P ANCA possible   - Churg Strauss Sydrome.   Gastrointestinal: Patient denies changes in appetite. No dysphagia, odynophagia or abdominal pain.  No hematochezia, melena, bowel habit changes or hemorrhoids.    No jaundice. GERD Gastic surgery  in past with poor results.   Genitourinary:   Patient denies dysuria, frequency, urgency or incontinence.  Complains of difficulty in starting or stopping urinary stream.    Reports history of stones which is followed by [facility].  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 (2017) prostate cancer. + Cancer Gleeson 6 + Biopsy.   Musculoskeletal: The patient denies history of arthritis or joint pains.  No loss of strength or dislocation.  Breasts:  No history of masses, lumps, pain, nipple changes or painful nipples.  Neurological: Patient denies vertigo, syncope.  No twitching or memory.   Psychological: Patient denies moodiness, depression or anxiety.  No obsessions or hallucinations.    Endocrine:  No history of goiter, exophthalmos or dryness of skin.  No polyuria or polyphagia.  No diabetes.   Hematopoietic:  No history of bleeding disorders or easy bruising.  Rheumatic:  No polyarthritis or inflammatory joint disease. History of positive P-ANCA    PHYSICAL EXAMINATION:  Vitals:    04/30/16 1556   BP: 128/60   Pulse: 60   Resp: 16   Temp: 98 ??F (36.7 ??C)   TempSrc: Oral   SpO2: 97%   Weight: 189 lb (85.7 kg)   Height: 5' 7.5" (1.715 m)     Constitutional: A 76 y.o.  male  who is alert, oriented, cooperative and in no apparent distress.  Head was normocephalic and atraumatic.  Male patten baldness.   EENT: Eyes were equal and reactive to light and accommodation.  Extraocular muscles intact.  No conjunctival injections.  External canals are patent.   No discharge was appreciated.  Septum was mildly deviated, mucosa was without erythema, exudates or cobblestoning.  No thrush was noted.  Swollen turbinates.   Neck: Supple without thyromegaly. No elevated JVP. Trachea was midline. No carotid bruits.     Respiratory: Symmetrical without dullness to percussion.  Breath sounds bilaterally were clear to auscultation.   No wheezes, rhonchi or rales. No intercostal retraction or use of accessory muscles. No egophony noted.   Cardiovascular: Regular without murmur, clicks, gallops or rubs.  No left or right ventricular heave.    Pulses:  Equal bilaterally.    Abdomen: Slightly rounded, soft without organomegaly. No rebound, rigidity or guarding.  Surgical (+) scars  Lymphatic: No lymphadenopathy.  Musculoskeletal:No gross muscle weakness.    Extremities:  No lower extremity edema.  Muscle size, tone and strength are normal.    Sensory function appears intact.  Deep tendon reflexes are normal.   Skin:  Warm and dry.  Good color, turgor and pigmentation. No lesions. Varicoties noted. No rash.   Neurological/Psychiatric: The patient's general behavior, level of consciousness, thought content and emotional status is normal.  Cranial nerves II-XII are intact.      DATA:     Today Spirometry compared to previous demonstrates an FVC of 3.80 which is 102 % of predicted with an FEV1 of 2.89 liters which is 103 % of predicted.  FEV1/FVC ratio is 76%.  Maximum voluntary ventilation is 128 liters per minute or 99 % of predicted.  Flow volume loop shows no signs of intrathoracic or extrathoracic obstruction. Impression. Normal.    Asthma Control Test [ACT] is a 5 part questionnaire, which has a point value system that ranges from 1- 5. This test is a validated questionnaire to help determine asthma control.  A total score of 19 or below indicates that the patients asthma may not be well controlled. If used in conjunction with PFTs, the correlation of  determining asthma control is even stronger.  Today on testing the ACT was 23 out of max of 25. Impression: Poor control.     Epworth Sleepiness Scale is use this Tool to Measure Sleep Deprivation. The Epworth Sleepiness Scale was developed by researchers in [place] and is widely used  by sleep professionals around the world to measure sleep deprivation.  How likely are you to doze off or fall asleep in the following situations, in contrast to feeling just tired? This refers to your usual way of life in recent times.  Results: Total Epworth Score: 3  Interpretation: No hypersomnolence.    IMPRESSION:      EVERHETT BOZARD a 75 y.o. male reactive airway disease, chronic cough from GERD S/p Nissen and severe nasal polyposis s/p surgery x 2. While we have never proven the systemic vasculitis or necrotizing vasculitis part and Julian Castaneda has no renal, neuropathic or cardiovascular involvement; the finding of chronic sinusitis, eosinophilia, pANCA (+) and asthma aid in the diagnosis.  Julian Castaneda has no neuropathy or neuropathic mononeuritis. His p-ANCA was 1:160 but fluctuates .  However, the clinical utility of ANCA in CSS raises a controversial issue of the sensitivity and specificity of ANCA in systemic vasculitis. In CSS P-ANCA is 50%.  Julian Castaneda should never receive leukotriene modifier, allopurinol, propylthiouracil and quinine with this history of CSS for it will cause active flairs.  SO we have been monitoring and Julian Castaneda is without flair or recurrence = false + test.  With his chronic allergies, sinusitis with rhinitis and nasal  polyposis is stable after surgery. Julian Castaneda also has a failed Nissen fundoplication with chronic gastroparesis and GERD stable based on last. As for his sleep apnea Julian Castaneda is doing very well with his BiPAP.  With this in mind, we would like to proceed with the following;                PLAN:    It is always good to see [place]. Julian Castaneda is still walking miles in the morning or at night when his wife is asleep. Most of the time is spent caring from his wife whom has dementia. Julian Castaneda states the dementia is worse and she is in hospice care.  We have stopped the Xolair for his allergies and elevated IgE level and Julian Castaneda is without a decline. Sleep apnea symptoms are excellent and we see no need to adjust therapy.  We  discussed the death of his son and daughter and declining health of his wife (dementia).  Julian Castaneda will follow up in 6 months to a year for re-evaluation.  Flu vaccine was given. We hope this updates you on my evaluation and clinical thinking. Thank you for allowing [place] to participate in Julian Snare care.     Sincerely,    Elmer Ramp, D.O., MPH, [facility] [employer], FACP  Associate Professor of Medicine  Director, [facility]

## 2016-04-30 NOTE — Patient Instructions (Addendum)
Patient Education        Helping a Person With Alzheimer's Disease: Care Instructions  Your Care Instructions  Alzheimer's disease is a type of dementia. It affects memory, intelligence, judgment, language, and behavior. It is not clear what causes this disease. But it is the most common form of dementia in older adults. It may take many years to develop.  Alzheimer's disease is different than mild memory loss that occurs with aging.  Family members usually notice symptoms first. But the person also may realize that something is wrong.  Follow-up care is a key part of your loved one's treatment and safety. Be sure to make and go to all appointments, and call your doctor if your loved one is having problems. It's also a good idea to know your loved one's test results and keep a list of the medicines he or she takes.  How can you care for your loved one at home?  ?? Develop a routine. The person will feel less frustrated or confused with a clear, simple daily plan. Remind him or her about important facts and events.  ?? Be patient. It may take longer for the person to complete a task than it used to.  ?? Help the person eat a balanced diet. Serve plenty of whole grains, fruits, and vegetables every day. If the person is not eating well at mealtimes, give snacks at midmorning and in the afternoon. Offer drinks such as Boost, Ensure, or Sustacal if he or she is losing weight.  ?? Encourage exercise. Walking and other activity may slow the decline of mental ability. Help the person keep an active mind. Encourage hobbies such as reading and crossword puzzles.  ?? Take steps to help if the person is sundowning. This is the restless behavior and trouble with sleeping that may occur in late afternoon and at night. Try not to let the person nap during the day. Offer a glass of warm milk or caffeine-free tea before bedtime.  ?? Ask family members and friends for help. You may need breaks where others can help care for the  person.  ?? Talk to the person's doctor about what resources are available for help in your area.  ?? Review all of the person's medicines with his or her doctor.  ?? For as long as the person is able, allow him or her to make decisions about activities, food, clothing, and other choices. Let the person be independent, even if tasks take more time or are not done perfectly. Tailor tasks to the person's abilities. For example, if cooking is no longer safe, ask for other help. He or she can help set the table or make simple dishes such as a salad. When the person needs help, offer it gently.  Keeping safe  ?? Make your home (or the person's home) safe. Tack down rugs, and put no-slip tape in the tub. Install handrails, and put safety switches on stoves and appliances. Keep rooms free of clutter. Make sure walkways around furniture are clear. Do not move furniture around, because the person may become confused.  ?? Use locks on doors and cupboards. Lock up knives, scissors, medicines, cleaning supplies, and other dangerous things.  ?? Do not let the person drive or cook if he or she cannot do it safely. A person with Alzheimer's should not drive unless he or she is able to pass an on-road driving test. Your state driver's license bureau can do a driving test if there is any   question.  ?? Get medical alert jewelry for the person so you can be contacted if he or she wanders away. If possible, provide a safe place for wandering, such as an enclosed yard or garden.  When should you call for help?  Call 911 anytime you think you may need emergency care. For example, call if:  ?? A person who has Alzheimer's disease has disappeared.  ?? A person who has Alzheimer's disease is seriously injured.  Call your doctor now or seek immediate medical care if:  ?? The person you are caring for suddenly sees or hears things that are not there (hallucinates).  ?? The person you are caring for has a sudden, drastic change in his or her  behavior.  Watch closely for changes in your loved one's health, and be sure to contact the doctor if:  ?? A person who has Alzheimer's disease gradually gets worse or has symptoms that could cause injury.  ?? You need help caring for a person with Alzheimer's disease.  ?? The person has problems with his or her medicine.  Where can you learn more?  Go to [email or web address] and sign in to your MyChart account. Enter G646 in the Search Health Information box to learn more about "Helping a Person With Alzheimer's Disease: Care Instructions."     If you do not have an account, please click on the "Sign Up Now" link.  Current as of: January 08, 2015  Content Version: 11.3  ?? 2006-2017 [employer]. Care instructions adapted under license by [facility]. If you have questions about a medical condition or this instruction, always ask your healthcare professional. [employer] disclaims any warranty or liability for your use of this information.

## 2016-04-30 NOTE — Progress Notes (Signed)
Patient to follow up with physician in 6 months.  Patient given the flu shot during office visit under the direction of Dr. Barreiro.

## 2016-05-11 ENCOUNTER — Encounter: Attending: Critical Care Medicine | Primary: Family Medicine

## 2016-11-06 ENCOUNTER — Ambulatory Visit
Admit: 2016-11-06 | Discharge: 2016-11-06 | Payer: MEDICARE | Attending: Critical Care Medicine | Primary: Family Medicine

## 2016-11-06 DIAGNOSIS — J45909 Unspecified asthma, uncomplicated: Secondary | ICD-10-CM

## 2016-11-06 LAB — SPIROMETRY WITHOUT BRONCHODILATOR
FEF 25-75% PRE PRED: 102
FEF 25-75%-Pre: 2.12
FEV1 %Pred-Pre: 101
FEV1/FVC Pred: 99
FEV1/FVC: 75 %
FEV1: 2.81 L
FEV3 PRE: 3.5
FVC %Pred-Pre: 101
FVC: 3.74 L
PEF-Pre: 507.3 L/sec

## 2016-11-06 LAB — POCT NITRIC OXIDE: Parts Per Billion: 29

## 2016-11-06 NOTE — Progress Notes (Signed)
Patient to follow up with physician in 6 months.

## 2016-11-06 NOTE — Patient Instructions (Addendum)
Patient Education        Learning About Transurethral Resection of the Prostate (TURP)  What is transurethral resection of the prostate (TURP)?    Transurethral resection of the prostate (TURP) is surgery to remove some prostate tissue. It is done when an overgrown prostate gland is pressing on the urethra and making it hard for a man to urinate.  The prostate gland is a small organ just below a man's bladder. It makes most of the fluid in semen. The urethra is the tube that carries urine from the bladder out of the body through the penis. It passes through the prostate. When the prostate gets too large, it can press on the urethra.  TURP is done to take pressure off of the urethra. It can help you have better control over starting and stopping your urine stream. You may feel like you get more relief when you urinate.  How is the surgery done?  Your doctor will give you medicine to make you sleep or feel relaxed. You will be kept comfortable. If you are awake during the surgery, you will get medicine to numb you from the chest down.  The doctor will put a thin, lighted tube, which is called a scope, into your urethra through the opening in your penis. Then the doctor will put small surgical tools or a tiny laser through the scope. He or she will then cut or burn away the section of the prostate that is blocking urine flow. When the surgery is finished, the doctor will take out the scope.  What can you expect after the surgery?  You may stay in the hospital for 1 to 2 days after the surgery. You may be able to go back to work and do many of your usual activities in 1 to 3 weeks. But it is important to avoid heavy lifting or strenuous activities for about 6 weeks.  If your surgery was done with a laser, you may feel better faster. Most men go home on the day of laser surgery, then see their doctor soon after. You may be able to go back to work and your usual activities after a few days. And you may be able to return  to strenuous activity and heavy lifting after about 2 weeks. But talk to your doctor first.  You may need a urinary catheter for a short time. This is a flexible plastic tube used to drain urine from your bladder when you can't urinate on your own. If it is still in place when you go home, your doctor will give you instructions for how to care for your catheter.  You may still feel like you need to urinate often in the weeks after your surgery. It often takes up to 6 weeks for this to get better.  After they recover from surgery, most men still can have erections (if they were able to have them before surgery). But they may not ejaculate when they have an orgasm. Semen may go into the bladder instead of out through the penis. This is called retrograde ejaculation. It does not hurt and is not harmful to your health. But it may mean that you will not be able to father a child. If this is a concern, talk to your doctor. You may be able to save your sperm before the surgery.  Follow-up care is a key part of your treatment and safety. Be sure to make and go to all appointments, and call your doctor if  you are having problems. It's also a good idea to know your test results and keep a list of the medicines you take.  Where can you learn more?  Go to [email or web address] and sign in to your MyChart account. Enter [phone] in the Search Health Information box to learn more about "Learning About Transurethral Resection of the Prostate (TURP)."     If you do not have an account, please click on the "Sign Up Now" link.  Current as of: May 17, 2016  Content Version: 11.6   2006-2018 [employer]. Care instructions adapted under license by [facility]. If you have questions about a medical condition or this instruction, always ask your healthcare professional. [employer] disclaims any warranty or liability for your use of this information.

## 2016-11-06 NOTE — Progress Notes (Signed)
[facility]    Department of Internal Medicine  Division of Pulmonary, Critical Care & Sleep Medicine    Dear Julian Guard, MD    We had the pleasure of seeing  Julian Castaneda in the [facility] regarding his chronic sinusitis, GERD S/p Nissen, reactive airway disease & obstructive sleep apnea.     HISTORY OF PRESENT ILLNESS:    Julian Castaneda is a 76 y.o. year old male who has a long standing history of allergies, elevated Ig E level around 600 requiring the administration of Xolair therapy. 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 Last level in 06/2010 was negative. The her recall He under when endoscopic surgery on 06/30/07 antrostomy bilateral with ethmoidectomies and left single sphenoidotomies and septoplasty. His treatment is working well, which included nasal rinses, Patanase, Astelin, and Xolair. I will  With his sleep apnea he is very compliant using the BiPAP machine nightly for at least 6 hour. His current pressure are 18/ 9 cm H20.     Since the last visit Julian Castaneda is doing well. We discussed his trip to [place], his wife's memory and health and getting a new condo. She is doing to a Dementia unit three times a week. At the last visit he was found to have a elevated PSA and a recent biopsy done showed prostate cancer Julian Castaneda score 6). He is holding on treatment.  He has had no pulmonary complaint.  He has been compliant with his CPAP and uses it nightly. His ACT is 22/25. He has no CP, SOB or leg edema. No neurologic symptoms. He has long standing GERD requiring anti-reflux surgery years ago. His last EGD which was negative by Dr. Lavone Nian he has had no repeat complaints. His antireflux surgery anastomosis was stable and clear.  He will  return in a 6 months.        ALLERGIES:    Allergies   Allergen Reactions   . Enalapril Swelling     Of lower lip   . Esomeprazole Hives and Swelling   . Nexium [Esomeprazole Magnesium Trihydrate] Swelling   . Nsaids Swelling   . Ranitidine Hives and Swelling   . Reglan [Metoclopramide Hcl] Other (See Comments)     States has bloating with this       PAST MEDICAL HISTORY:       Diagnosis Date   . Allergy    . Asthma     doing well per patient   . Chronic pansinusitis 10/03/2014   . CSS (Churg-Strauss syndrome) (HCC)     no current issues   . Diabetes mellitus (HCC)     patient denies   . Elevated PSA     negative biospy   . GERD (gastroesophageal reflux disease)    . Hyperlipidemia    . OSA treated with BiPAP 03/27/2014   . Sinusitis    . Sleep apnea     CHM 18/9        MEDICATIONS:   Current Outpatient Prescriptions   Medication Sig Dispense Refill   . finasteride (PROSCAR) 5 MG tablet Take 5 mg by mouth daily States he will not take until he is done with his daily antibiotics     . colestipol (COLESTID) 1 G tablet Take 1 g  by mouth 2 times daily     . amLODIPine (NORVASC) 2.5 MG tablet Take 2.5 mg by mouth nightly     . hydrochlorothiazide (HYDRODIURIL) 25 MG tablet Take 25 mg by mouth daily.       Marland Kitchen. atorvastatin (LIPITOR) 80 MG tablet Take 80 mg by mouth daily.     Pauline Aus. XOPENEX HFA 45 MCG/ACT inhaler Inhale 1 puff into the lungs every 8 hours as needed for Wheezing. 1 Inhaler 12     No current facility-administered medications for this visit.        SOCIAL AND OCCUPATIONAL HEALTH:  He is married with four children. One son died from HF, daughter died from CAD all yound. Wife has dementia (advanced). The patient is a never smoked  There is not history of TB or TB exposure.  No Etoh Abuse.  There is not asbestos or silica dust exposure.  The patient reports no coal, foundry, quarry or [employer] exposure.  Travel history reveals no trips.  Denies history of recreational or IV drug use. Denies hot tub exposure. The  patient has no pets. Hobbies include watching soap opera.    Social History   Substance Use Topics   . Smoking status: Never Smoker   . Smokeless tobacco: Never Used   . Alcohol use No       SURGICAL HISTORY:   Past Surgical History:   Procedure Laterality Date   . ENDOSCOPY, COLON, DIAGNOSTIC  01/22/2014   . GASTRIC FUNDOPLICATION  2005   . HERNIA REPAIR     . NASAL POLYP SURGERY     . PROSTATE BIOPSY  2011    negative pathology   . SEPTOPLASTY     . TUMOR REMOVAL  11/2010    fatty tumor removed Dr Lavone NianAwad   . UPPER GASTROINTESTINAL ENDOSCOPY  12/2009    with biopsy, pathology negative       FAMILY HISTORY:   Family History   Problem Relation Age of Onset   . High Blood Pressure Father    . High Cholesterol Father    . Asthma Sister    . Diabetes Sister        REVIEW OF SYSTEMS:   Constitutional: General health good . No weight changes.  No fevers, fatigue or weakness.   Head: Patient denies any history of trauma, convulsive disorder or syncope.    Skin:  Patient denies history of changes in pigmentation, eruptions or pruritus.  No easy bruising or bleeding.  EENT: Patient denies any history of color blindness, photophobia, diplopia, inflammation, cataracts or glaucoma.   Patient denies history of deafness, tinnitus, pain, discharge or recurrent infections.    Patient has a long history of rhinitis, chronic nasal discharge, drainage, & nasal polyps; all requiring surgery. Patient denies history of soreness of mouth or tongue.   Patient denies history of hoarseness, voice changes, sore throats or tonsillitis.    Lymphatic:  Patient denies history of enlargement, inflammation, pain or suppuration.    Cardiovascular:  Patient denies history of palpations, heart murmur, irregular rhythm, chest pain, exertional dyspnea, cyanosis, ascites, rheumatic fever, cold extremities or edema.  Respiration:  Patient denies wheezing, dyspnea, nocturnal dyspnea, cough, hemoptysis, pleurisy, TB or asthma.   Known OSA on [place] 19/ 8 cm H20.  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   Gastrointestinal: Patient denies changes in appetite. No dysphagia, odynophagia or abdominal pain.  No hematochezia, melena, bowel habit changes or hemorrhoids.    No jaundice.  GERD Gastic surgery in past with poor results.   Genitourinary:   Patient denies dysuria, frequency, urgency or incontinence.  Complains of difficulty in starting or stopping urinary stream.    Reports history of stones which is followed by [facility].  He has benign prostatic hypertrophy. (2017) prostate cancer. + Cancer Gleeson 6 + Biopsy.   Musculoskeletal: The patient denies history of arthritis or joint pains.  No loss of strength or dislocation.  Breasts:  No history of masses, lumps, pain, nipple changes or painful nipples.  Neurological: Patient denies vertigo, syncope.  No twitching or memory.   Psychological: Patient denies moodiness, depression or anxiety.  No obsessions or hallucinations.    Endocrine:  No history of goiter, exophthalmos or dryness of skin.  No polyuria or polyphagia.  No diabetes.   Hematopoietic:  No history of bleeding disorders or easy bruising.  Rheumatic:  No polyarthritis or inflammatory joint disease. History of positive P-ANCA    PHYSICAL EXAMINATION:  Vitals:    11/06/16 0932   BP: (!) 146/79   Pulse: 89   Resp: 18   Temp: 98.7 F (37.1 C)   TempSrc: Oral   SpO2: 97%   Weight: 188 lb (85.3 kg)   Height: 5' 7.5" (1.715 m)     Constitutional: A 76 y.o.  male  who is alert, oriented, cooperative and in no apparent distress.  Head was normocephalic and atraumatic.  Male patten baldness.   EENT: Eyes were equal and reactive to light and accommodation.  Extraocular muscles intact.  No conjunctival injections.  External canals are patent.   No discharge was appreciated.  Septum was mildly deviated, mucosa was without erythema, exudates or cobblestoning.  No thrush was noted.  Swollen turbinates.   Neck: Supple without thyromegaly. No elevated JVP. Trachea was  midline. No carotid bruits.    Respiratory: Symmetrical without dullness to percussion.  Breath sounds bilaterally were clear to auscultation.   No wheezes, rhonchi or rales. No intercostal retraction or use of accessory muscles. No egophony noted.   Cardiovascular: Regular without murmur, clicks, gallops or rubs.  No left or right ventricular heave.    Pulses:  Equal bilaterally.    Abdomen: Slightly rounded, soft without organomegaly. No rebound, rigidity or guarding.  Surgical (+) scars  Lymphatic: No lymphadenopathy.  Musculoskeletal:No gross muscle weakness.    Extremities:  No lower extremity edema.  Muscle size, tone and strength are normal.    Sensory function appears intact.  Deep tendon reflexes are normal.   Skin:  Warm and dry.  Good color, turgor and pigmentation. No lesions. Varicoties noted. No rash.   Neurological/Psychiatric: The patient's general behavior, level of consciousness, thought content and emotional status is normal.  Cranial nerves II-XII are intact.      DATA:     Today Spirometry compared to previous demonstrates an FVC of 3.80 which is 102 % of predicted with an FEV1 of 2.89 liters which is 103 % of predicted.  FEV1/FVC ratio is 76%.  Maximum voluntary ventilation is 128 liters per minute or 99 % of predicted.  Flow volume loop shows no signs of intrathoracic or extrathoracic obstruction. Impression. Normal.    Asthma Control Test [ACT] is a 5 part questionnaire, which has a point value system that ranges from 1- 5. This test is a validated questionnaire to help determine asthma control.  A total score of 19 or below indicates that the patients asthma may not be well controlled. If used in conjunction with  PFTs, the correlation of determining asthma control is even stronger.  Today on testing the ACT was 23 out of max of 25. Impression: Poor control.     Epworth Sleepiness Scale is use this Tool to Measure Sleep Deprivation. The Epworth Sleepiness Scale was developed by researchers in  [place] and is widely used by sleep professionals around the world to measure sleep deprivation.  How likely are you to doze off or fall asleep in the following situations, in contrast to feeling just tired? This refers to your usual way of life in recent times.  Results: Total Epworth Score: 3  Interpretation: No hypersomnolence.    IMPRESSION:      MASOUD NYCE a 76 y.o. male reactive airway disease, chronic cough from GERD S/p Nissen and severe nasal polyposis s/p surgery x 2. While we have never proven the systemic vasculitis or necrotizing vasculitis part and he has no renal, neuropathic or cardiovascular involvement; the finding of chronic sinusitis, eosinophilia, pANCA (+) and asthma aid in the diagnosis.  He has no neuropathy or neuropathic mononeuritis. His p-ANCA was 1:160 but fluctuates .  However, the clinical utility of ANCA in CSS raises a controversial issue of the sensitivity and specificity of ANCA in systemic vasculitis. In CSS P-ANCA is 50%.  He should never receive leukotriene modifier, allopurinol, propylthiouracil and quinine with this history of CSS for it will cause active flairs.  SO we have been monitoring and he is without flair or recurrence = false + test.  With his chronic allergies, sinusitis with rhinitis and nasal  polyposis is stable after surgery. He also has a failed Nissen fundoplication with chronic gastroparesis and GERD stable based on last. As for his sleep apnea he is doing very well with his BiPAP.  With this in mind, we would like to proceed with the following;                PLAN:    It is always good to see [place]. He is still walking miles in the morning or at night when his wife is asleep. Most of the time is spent caring from his wife whom has dementia. We have stopped the Xolair for his allergies and elevated IgE level and he is without a decline. Sleep apnea symptoms are excellent and we see no need to adjust therapy.  We discussed the death of his son and  daughter and declining health of his wife (dementia).  He will follow up in 6 months to a year for re-evaluation.  Vaccines were reviewed. We hope this updates you on my evaluation and clinical thinking. Thank you for allowing [place] to participate in [name] care.     Sincerely,    Elmer Ramp, D.O., MPH, [employer], FACP  Associate Professor of Medicine  Director, [facility]

## 2016-11-18 ENCOUNTER — Ambulatory Visit: Admit: 2016-11-18 | Discharge: 2016-11-18 | Payer: MEDICARE | Attending: Surgery | Primary: Family Medicine

## 2016-11-18 DIAGNOSIS — K219 Gastro-esophageal reflux disease without esophagitis: Secondary | ICD-10-CM

## 2016-11-18 NOTE — Progress Notes (Signed)
Patient's Name/Date of Birth: Julian Castaneda / 04-15-1941    Date: 11/18/2016    PCP: [name], MD    Chief Complaint   Patient presents with   . Colonoscopy     5 year colon recall  3 year recall egd       HPI:    Patient seen for evaluation of 2 problems:  1: GERD symptoms  2: Diarrhea and Hx of colon polyps    3; recent unintended weight loss  4: recent diagnosis of prostate CA  Patient's medications, allergies, past medical, surgical, social and family histories were reviewed and updated as appropriate.    Allergies   Allergen Reactions   . Enalapril Swelling     Of lower lip   . Esomeprazole Hives and Swelling   . Nexium [Esomeprazole Magnesium Trihydrate] Swelling   . Nsaids Swelling   . Ranitidine Hives and Swelling   . Reglan [Metoclopramide Hcl] Other (See Comments)     States has bloating with this       Past Medical History:   Diagnosis Date   . Allergy    . Asthma     doing well per patient   . Chronic pansinusitis 10/03/2014   . CSS (Churg-Strauss syndrome) (HCC)     no current issues   . Diabetes mellitus (HCC)     patient denies   . Elevated PSA     negative biospy   . GERD (gastroesophageal reflux disease)    . Hyperlipidemia    . OSA treated with BiPAP 03/27/2014   . Sinusitis    . Sleep apnea     CHM 18/9        Past Surgical History:   Procedure Laterality Date   . ENDOSCOPY, COLON, DIAGNOSTIC  01/22/2014   . GASTRIC FUNDOPLICATION  2005   . HERNIA REPAIR     . NASAL POLYP SURGERY     . PROSTATE BIOPSY  2011    negative pathology   . SEPTOPLASTY     . TUMOR REMOVAL  11/2010    fatty tumor removed Dr Lavone Nian   . UPPER GASTROINTESTINAL ENDOSCOPY  12/2009    with biopsy, pathology negative        Social History   Substance Use Topics   . Smoking status: Never Smoker   . Smokeless tobacco: Never Used   . Alcohol use No       Current Outpatient Prescriptions   Medication Sig Dispense Refill   . finasteride (PROSCAR) 5 MG tablet Take 5 mg by mouth daily States he will not take until he is done with his  daily antibiotics     . colestipol (COLESTID) 1 G tablet Take 1 g by mouth 2 times daily     . amLODIPine (NORVASC) 2.5 MG tablet Take 2.5 mg by mouth nightly     . hydrochlorothiazide (HYDRODIURIL) 25 MG tablet Take 25 mg by mouth daily.       Marland Kitchen atorvastatin (LIPITOR) 80 MG tablet Take 80 mg by mouth daily.     Pauline Aus HFA 45 MCG/ACT inhaler Inhale 1 puff into the lungs every 8 hours as needed for Wheezing. 1 Inhaler 12     No current facility-administered medications for this visit.        Review of Systems  Constitutional: negative  Eyes: negative  Ears, nose, mouth, throat, and face: negative  Respiratory: negative  Cardiovascular: negative  Gastrointestinal: negative  Genitourinary:negative  Integument/breast: negative  Hematologic/lymphatic:  negative  Musculoskeletal:negative  Neurological: negative  Allergic/Immunologic: negative    Physical exam:  BP 129/73   Pulse 56   Temp 98 F (36.7 C) (Oral)   Resp 16   Ht 5\' 9"  (1.753 m)   Wt 187 lb (84.8 kg)   SpO2 96%   BMI 27.62 kg/m   General appearance: no acute distress  Head:NCAT, EOMI, PERRLA, conjunctiva pink  Neck: no masses, supple  Lungs: CTABL  Heart: RRR  Abdomen: soft, nondistended, nontender, no guarding, no peritoneal signs, normoactive bowel sounds  Extremities:no edema  Neuro exam: normal  Skin: no lesions, no rashes    Assessment/Plan:  .proceed with panendoscopy    The procedure risks, benfits, possible complications and alternative options where explained to the patient, he understands and agrees to proceed with surgery.    Return in about 4 weeks (around 12/16/2016).    Dennie MaizesMounir Saryna Kneeland, MD      Send copy of H&P to PCP, Nechama GuardScott M Agnew, MD

## 2016-11-19 NOTE — Progress Notes (Signed)
PROCEDURE DATE ABD TIME - PT NOTIFIED AND GIVEN INSTRUCTIONS  PLEASE SEE PROCEDURE PACKET [facility] SCANNED INTO MEDIA TAB.  [facility]  EGD COLON  PRIOR AUTHORIZATION NOT REQUIRED  MCR  Electronically signed by Leonie DouglasMary Lou Anas Reister, MA on 11/19/2016 at 8:52 AM

## 2016-12-15 NOTE — Progress Notes (Signed)
[facility] PRE-ADMISSION TESTING INSTRUCTIONS    The Preadmission Testing patient is instructed accordingly using the following criteria (check applicable):    ARRIVAL INSTRUCTIONS:  [x] Parking the day of Surgery is located in the Main Entrance lot.  Upon entering the door, make an immediate right to the surgery reception desk    [x] Bring photo ID and insurance card    [] Bring in a copy of Living will or Durable Power of attorney papers.    [x] Please be sure to arrange transportation to and from the hospital    [x] Please arrange for someone to be with you the remainder of the day due to having anesthesia      GENERAL INSTRUCTIONS:    [x] Nothing by mouth after midnight, including gum, candy, mints or water    [x] You may brush your teeth, but do not swallow any water    [] Take medications as instructed with 1-2 oz of water    [x] Stop herbal supplements and vitamins 5 days prior to procedure    [x] Follow preop dosing of blood thinners per physician instructions    [] Do not take insulin or oral diabetic medications    [] If diabetic and have low blood sugar or feel symptomatic, take 1-2oz apple juice or glucose tablets    [] Bring inhalers day of surgery    [] Bring C-PAP/ Bi-Pap day of surgery    [] Bring urine specimen day of surgery    [] Antibacterial Soap shower or bath AM of Surgery, no lotion, powders or creams to surgical site    [x] Follow bowel prep as instructed per surgeon    [x] No tobacco products within 24 hours of surgery     [x] No alcohol or illegal drug use within 24 hours of surgery.    [x] Jewelry, body piercing's, eyeglasses, contact lenses and dentures are not permitted into surgery (bring cases)      [] Please do not wear any nail polish or make up on the day of surgery    [x] If not already done, you can expect a call from registration    [x] If surgeon requests a time change you will be notified the day prior to surgery    [x] If you receive a survey after  surgery we would greatly appreciate your comments    [] Parent/guardian of a minor must accompany their child and remain on the premises  the entire time they are under our care     [] Pediatric patients may bring favorite toy, blanket or comfort item with them    [] A caregiver or family member must remain with the patient during their stay if they are mentally handicapped, have dementia, disoriented or unable to use a call light or would be a safety concern if left unattended    [x] Please notify surgeon if you develop any illness between now and time of surgery (cold, cough, sore throat, fever, nausea, vomiting) or any signs of infections  including skin, wounds, and dental.    [x] Other instructions    EDUCATIONAL MATERIALS PROVIDED:    [] PAT Preoperative Education Packet/Booklet     [] Medication List    [] Fluoroscopy Information Pamphlet    [] Transfusion bracelet applied with instructions    [] Joint replacement video reviewed    [] Shower with antibacterial soap and use CHG wipes provided the evening before surgery as   instructed

## 2016-12-28 ENCOUNTER — Inpatient Hospital Stay: Payer: MEDICARE

## 2016-12-28 MED ORDER — SODIUM CHLORIDE 0.9 % IJ SOLN
0.9 % | Freq: Two times a day (BID) | INTRAMUSCULAR | Status: DC
Start: 2016-12-28 — End: 2016-12-28

## 2016-12-28 MED ORDER — PROPOFOL 200 MG/20ML IV EMUL
200 | INTRAVENOUS | Status: AC
Start: 2016-12-28 — End: 2016-12-28

## 2016-12-28 MED ORDER — SODIUM CHLORIDE 0.9 % IV SOLN
0.9 % | INTRAVENOUS | Status: DC
Start: 2016-12-28 — End: 2016-12-28
  Administered 2016-12-28 (×2): via INTRAVENOUS

## 2016-12-28 MED ORDER — SODIUM CHLORIDE 0.9 % IJ SOLN
0.9 % | INTRAMUSCULAR | Status: DC | PRN
Start: 2016-12-28 — End: 2016-12-28

## 2016-12-28 MED FILL — DIPRIVAN 200 MG/20ML IV EMUL: 200 MG/20ML | INTRAVENOUS | Qty: 40

## 2016-12-28 NOTE — Anesthesia Pre-Procedure Evaluation (Signed)
Department of Anesthesiology  Preprocedure Note       Name:  Julian Castaneda   Age:  76 y.o.  DOB:  Apr 01, 1941                                          MRN:  [id number]         Date:  12/28/2016      Surgeon: Juliann Mule):  Gearldine Bienenstock, MD    Procedure: Procedure(s):  EGD ESOPHAGOGASTRODUODENOSCOPY  COLONOSCOPY DIAGNOSTIC    Medications prior to admission:   Prior to Admission medications    Medication Sig Start Date End Date Taking? Authorizing Provider   vitamin D (CHOLECALCIFEROL) 1000 UNIT TABS tablet Take 1,000 Units by mouth daily LD 12-23-16   Yes Historical Provider, MD   finasteride (PROSCAR) 5 MG tablet Take 5 mg by mouth daily States he will not take until he is done with his daily antibiotics    Historical Provider, MD   colestipol (COLESTID) 1 G tablet Take 1 g by mouth 2 times daily    Historical Provider, MD   amLODIPine (NORVASC) 2.5 MG tablet Take 2.5 mg by mouth nightly    Historical Provider, MD   hydrochlorothiazide (HYDRODIURIL) 25 MG tablet Take 25 mg by mouth daily.      Historical Provider, MD   atorvastatin (LIPITOR) 80 MG tablet Take 80 mg by mouth daily.    Historical Provider, MD       Current medications:    No current facility-administered medications for this encounter.      Current Outpatient Prescriptions   Medication Sig Dispense Refill   . vitamin D (CHOLECALCIFEROL) 1000 UNIT TABS tablet Take 1,000 Units by mouth daily LD 12-23-16     . finasteride (PROSCAR) 5 MG tablet Take 5 mg by mouth daily States he will not take until he is done with his daily antibiotics     . colestipol (COLESTID) 1 G tablet Take 1 g by mouth 2 times daily     . amLODIPine (NORVASC) 2.5 MG tablet Take 2.5 mg by mouth nightly     . hydrochlorothiazide (HYDRODIURIL) 25 MG tablet Take 25 mg by mouth daily.       Marland Kitchen atorvastatin (LIPITOR) 80 MG tablet Take 80 mg by mouth daily.         Allergies:    Allergies   Allergen Reactions   . Enalapril Swelling     Of lower lip   . Esomeprazole Hives and Swelling   . Nexium  [Esomeprazole Magnesium Trihydrate] Swelling   . Nsaids Swelling   . Ranitidine Hives and Swelling   . Reglan [Metoclopramide Hcl] Other (See Comments)     States has bloating with this       Problem List:    Patient Active Problem List   Diagnosis Code   . Chronic sinusitis J32.9   . 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 Asthma J45.909   . Elevated PSA R97.20   . Environmental allergies Z91.09   . Environmental allergies Z91.09   . Environmental allergies Z91.09   . OSA treated with BiPAP G47.33   . 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 SIRS (systemic inflammatory response syndrome) (HCC) R65.10   . BPH (benign prostatic hyperplasia) N40.0   . Complicated UTI (urinary tract infection) N39.0       Past Medical History:  Diagnosis Date   . Allergy    . Asthma     doing well per patient   . Chronic pansinusitis 10/03/2014   . COPD (chronic obstructive pulmonary disease) ([place])    . 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 Elevated PSA     negative biospy   . Encounter for screening colonoscopy     and EGD 12-28-16    . GERD (gastroesophageal reflux disease)    . Hyperlipidemia    . Hypertension    . OSA treated with BiPAP 03/27/2014   . Sepsis ([place])     06-2015 - resolved    . Sinusitis        Past Surgical History:        Procedure Laterality Date   . COLONOSCOPY     . CYSTOSCOPY      prostate biopsy - 06/2015    . ENDOSCOPY, COLON, DIAGNOSTIC  01/22/2014   . GASTRIC FUNDOPLICATION  8101   . HERNIA REPAIR     . NASAL POLYP SURGERY     . PROSTATE BIOPSY  2011    negative pathology   . SEPTOPLASTY     . TUMOR REMOVAL  11/2010    fatty tumor removed Dr Freddrick March   . UPPER GASTROINTESTINAL ENDOSCOPY  12/2009    with biopsy, pathology negative       Social History:    Social History   Substance Use Topics   . Smoking status: Never Smoker   . Smokeless tobacco: Never Used   . Alcohol use No                                Counseling given: Not Answered      Vital Signs (Current):   Vitals:    12/15/16 1223    Weight: 179 lb (81.2 kg)   Height: 5' 9.5" (1.765 m)                                              BP Readings from Last 3 Encounters:   11/18/16 129/73   11/06/16 (!) 146/79   04/30/16 128/60       NPO Status:                                                                                 BMI:   Wt Readings from Last 3 Encounters:   11/18/16 187 lb (84.8 kg)   11/06/16 188 lb (85.3 kg)   04/30/16 189 lb (85.7 kg)     Body mass index is 26.05 kg/m.    CBC:   Lab Results   Component Value Date    WBC 4.6 07/15/2015    RBC 4.51 07/15/2015    HGB 12.7 07/15/2015    HCT 38.5 07/15/2015    MCV 85.4 07/15/2015    RDW 13.7 07/15/2015    PLT 187 07/15/2015       CMP:   Lab Results  Component Value Date    NA 139 07/15/2015    K 3.6 07/15/2015    CL 101 07/15/2015    CO2 25 07/15/2015    BUN 21 07/15/2015    CREATININE 0.8 07/15/2015    GFRAA >60 07/15/2015    LABGLOM >60 07/15/2015    GLUCOSE 98 07/15/2015    GLUCOSE 86 06/21/2009    PROT 7.0 07/15/2015    CALCIUM 9.2 07/15/2015    BILITOT 1.1 07/15/2015    ALKPHOS 83 07/15/2015    AST 23 07/15/2015    ALT 21 07/15/2015       POC Tests: No results for input(s): POCGLU, POCNA, POCK, POCCL, POCBUN, POCHEMO, POCHCT in the last 72 hours.    Coags:   Lab Results   Component Value Date    PROTIME 10.4 07/14/2014    INR 1.0 07/14/2014    APTT 29.1 07/14/2014       HCG (If Applicable): No results found for: PREGTESTUR, PREGSERUM, HCG, HCGQUANT     ABGs: No results found for: PHART, PO2ART, PCO2ART, HCO3ART, BEART, O2SATART     Type & Screen (If Applicable):  No results found for: LABABO, [place]    Anesthesia Evaluation  Patient summary reviewed no history of anesthetic complications:   Airway: Mallampati: III  TM distance: >3 FB   Neck ROM: full   Dental: normal exam         Pulmonary: breath sounds clear to auscultation  (+) COPD:  sleep apnea (BiPap):  asthma:                            Cardiovascular:  Exercise tolerance: good (>4 METS),   (+) hypertension:,         Rhythm:  regular                      Neuro/Psych:               GI/Hepatic/Renal:   (+) GERD:,           Endo/Other:                     Abdominal:           Vascular:                                      Anesthesia Plan      MAC     ASA 3       Induction: intravenous.      Anesthetic plan and risks discussed with patient.      Plan discussed with CRNA.                  Gentry Roch, MD   12/28/2016

## 2016-12-28 NOTE — Brief Op Note (Signed)
Brief Postoperative Note    Julian Castaneda  Date of Birth:  [birth year]  [id number]    Pre-operative Diagnosis: gerd/diarrhea    Post-operative Diagnosis: Same    Procedure: egd/bx /colon/polypectomy    Anesthesia: MAC    Surgeons/Assistants: Brentlee Sciara    Estimated Blood Loss: less than 50     Complications: None    Specimens: Was Obtained:     Findings:     Electronically signed by Gearldine Bienenstock, MD on 12/28/2016 at 10:11 AM

## 2016-12-28 NOTE — H&P (Signed)
Patient's Name/Date of Birth: Julian Castaneda / [birth year]    Date: 11/18/2016    PCP: [name], MD    No chief complaint on file.      HPI:  Patient admitted for panendoscopy for symptoms of GERD, diarrhea and weight loss. Previous hx of colon polyps    Patient's medications, allergies, past medical, surgical, social and family histories were reviewed and updated as appropriate.    Allergies   Allergen Reactions   . Enalapril Swelling     Of lower lip   . Esomeprazole Hives and Swelling   . Nexium [Esomeprazole Magnesium Trihydrate] Swelling   . Nsaids Swelling   . Ranitidine Hives and Swelling   . Reglan [Metoclopramide Hcl] Other (See Comments)     States has bloating with this       Past Medical History:   Diagnosis Date   . Allergy    . Asthma     doing well per patient   . Chronic pansinusitis 10/03/2014   . COPD (chronic obstructive pulmonary disease) ([facility])    . CSS (Churg-Strauss syndrome) (HCC)     no current issues   . Elevated PSA     negative biospy   . Encounter for screening colonoscopy     and EGD 12-28-16    . GERD (gastroesophageal reflux disease)    . Hyperlipidemia    . Hypertension    . OSA treated with BiPAP 03/27/2014   . Sepsis ([facility])     06-2015 - resolved    . Sinusitis         Past Surgical History:   Procedure Laterality Date   . COLONOSCOPY     . CYSTOSCOPY      prostate biopsy - 06/2015    . ENDOSCOPY, COLON, DIAGNOSTIC  01/22/2014   . GASTRIC FUNDOPLICATION  2005   . HERNIA REPAIR     . NASAL POLYP SURGERY     . PROSTATE BIOPSY  2011    negative pathology   . SEPTOPLASTY     . TUMOR REMOVAL  11/2010    fatty tumor removed Dr Lavone Nian   . UPPER GASTROINTESTINAL ENDOSCOPY  12/2009    with biopsy, pathology negative        Social History   Substance Use Topics   . Smoking status: Never Smoker   . Smokeless tobacco: Never Used   . Alcohol use No       Current Facility-Administered Medications   Medication Dose Route Frequency Provider Last Rate Last Dose   . 0.9 % sodium chloride infusion    Intravenous Continuous [name], MD 75 mL/hr at 12/28/16 0912     . sodium chloride (PF) 0.9 % injection 10 mL  10 mL Intravenous 2 times per day [name], MD       . sodium chloride (PF) 0.9 % injection 10 mL  10 mL Intravenous PRN [name], MD             Review of Systems  Constitutional: negative  Eyes: negative  Ears, nose, mouth, throat, and face: negative  Respiratory: negative  Cardiovascular: negative  Gastrointestinal: negative  Genitourinary:negative  Integument/breast: negative  Hematologic/lymphatic: negative  Musculoskeletal:negative  Neurological: negative  Allergic/Immunologic: negative    Physical exam:  BP (!) 161/76   Pulse 53   Temp 98 F (36.7 C)   Resp 20   Ht 5\' 9"  (1.753 m)   Wt 179 lb (81.2 kg)   SpO2 99%  BMI 26.43 kg/m   General appearance: no acute distress  Head:NCAT, EOMI, PERRLA, conjunctiva pink  Neck: no masses, supple  Lungs: CTABL  Heart: RRR  Abdomen: soft, nondistended, nontender, no guarding, no peritoneal signs, normoactive bowel sounds  Extremities:no edema    Assessment/Plan:  .proceed with panendoscopy    The procedure risks, benfits, possible complications and alternative options where explained to the patient, he understands and agrees to proceed with surgery.    No Follow-up on file.    No name on file.      Send copy of H&P to PCP, Nechama GuardScott M Agnew, MD

## 2016-12-28 NOTE — Progress Notes (Signed)
Abdomen soft, mildly distended, and bowel sounds active times four quadrants.

## 2016-12-28 NOTE — Progress Notes (Signed)
1018 admitted to rm 18 family at bedside  [id number] Dr Lavone NianAwad here to speak with pt and son  1048 discharge instructions given to pt and family verbalizes understanding

## 2016-12-28 NOTE — Op Note (Signed)
[facility]                    [address] [place], [place] [id number]                                 OPERATIVE REPORT    PATIENT NAME: Julian Castaneda, Julian J                 DOB:        [birth year]  MED REC NO:   [id number]                            ROOM:  ACCOUNT NO:   [id number]                           ADMIT DATE: 12/28/2016  PROVIDER:     Dennie MaizesMounir Talullah Abate, MD    DATE OF PROCEDURE:  12/28/2016    PREOPERATIVE DIAGNOSES:  1.  Gastroesophageal reflux disease.  2.  Diarrhea.  3.  Weight loss.  4.  Previous history of colon polyps.    POSTOPERATIVE DIAGNOSE:  1.  Gastroesophageal reflux disease.  2.  Antral gastritis.  3.  Diverticulosis coli.  4.  Polyp, ascending colon.    PROCEDURE PERFORMED:  1.  Esophagogastroduodenoscopy with gastric biopsies.  2.  Total colonoscopy with forceps polypectomy.    INDICATIONS:  A 76 year old male patient with a previous history of severe  GERD and also colon polyps.  The patient is status post [facility] one or two years  ago.  The patient was seen in my office because of recent weight loss and  increasing symptoms of GERD, diarrhea, and previous history of colon  polyps.    DESCRIPTION OF PROCEDURE:  With the patient in the left lateral position on  the operating table, after adequate sedation was administered by  Anesthesia, standard Olympus gastroscope was introduced through the mouth  and advanced into the esophagus.  Minimal gastroesophageal reflux was  noted.  GE junction revealed some minimal changes of possible Barrett's.   There were no ulcers, no bleeding.  Stomach revealed normal gastric folds.   Fundus of the stomach was visualized by retroflexing the end of the scope  and revealed postop changes of a Nissen fundoplication with no other  pathology.  Antrum of the stomach revealed mild antral gastritis.  No  ulcers, no bleeding.  Photos and biopsies were taken.   Pyloric opening was  normal.  Visualization of the first, second, third, and fourth parts of the  duodenum showed no evidence of ulcers, bleeding, or other pathology.  Scope  was slowly withdrawn and totally removed.  The patient tolerated the  procedure well.    Digital rectal examination and dilatation were performed, showed evidence  of good sphincter tone.  There were no palpable masses and no blood on the  examining finger.  Standard Olympus colonoscope was introduced through the  anal opening and advanced into the rectosigmoid.  The anus and rectum were  normal.  Sigmoid colon showed evidence of diverticulosis without evidence  of diverticulitis.  The remainder of the left colon visualized was normal.   Transverse colon was visualized in its entirety and was normal.  Ascending  colon and cecum as well as ileocecal  valve and appendiceal opening were  reached and visualized.  In the mid ascending colon, a 5-mm flat  benign-looking polyp was noted, was removed in piecemeal with a biopsy  forceps.  Cecum and ileocecal valve were normal.  Photos were taken.  The  scope was slowly withdrawn.  Further observation of the colon on the way  out revealed no other pathology.  The scope was totally removed.  The  patient tolerated the procedure well.    RECOMMENDATIONS:  1.  Low-residue diet.  2.  Antireflux precautions.  3.  Await biopsy results before making further recommendations.        [name], MD    D: 12/28/2016 10:20:23       T: 12/28/2016 12:20:16     [email or web address]  Job#: [id number]     Doc#: [id number]    CC:  [name], MD       Alphonzo Dublin, MD

## 2016-12-28 NOTE — Anesthesia Post-Procedure Evaluation (Signed)
Department of Anesthesiology  Postprocedure Note    Patient: Julian Castaneda  MRN: [id number]  Birthdate: 1941-06-07  Date of evaluation: 12/28/2016  Time:  3:22 PM     Procedure Summary     Date:  12/28/16 Room / Location:  [facility] / [facility]    Anesthesia Start:   Anesthesia Stop:      Procedures:       EGD ESOPHAGOGASTRODUODENOSCOPY (N/A )      COLONOSCOPY DIAGNOSTIC (N/A ) Diagnosis:  (HISTORY OF COLON POLYPS, DIARRHEA, GERD)    Surgeon:  Gearldine Bienenstock, MD Responsible Provider:      Anesthesia Type:  MAC ASA Status:  3          Anesthesia Type: MAC    Aldrete Phase I: Aldrete Score: 10    Aldrete Phase II: Aldrete Score: 10    Last vitals: Reviewed and per EMR flowsheets.       Anesthesia Post Evaluation    Patient location during evaluation: PACU  Patient participation: complete - patient participated  Level of consciousness: awake and alert  Airway patency: patent  Nausea & Vomiting: no vomiting  Complications: no  Cardiovascular status: blood pressure returned to baseline  Respiratory status: acceptable  Hydration status: stable

## 2016-12-28 NOTE — Discharge Instructions (Addendum)
[facility] AM[facility] PROCEDURE DISCHARGE INSTRUCTIONS  You may be drowsy or lightheaded after receiving sedation or anesthesia.    A responsible person should be with you for the next 24 hours.    Please follow the instructions checked below:    DIET INSTRUCTIONS:  [x]Start with light diet and progress to your normal diet as you feel like eating. If you experience nausea or repeated episodes of vomiting which persist beyond 12-24 hours, notify your doctor.  []Other     ACTIVITY INSTRUCTIONS:  [x]Rest today. Increase activity as tolerated    []No heavy lifting or strenuous activity     [x]No driving for today  []Other      MEDICATION INSTRUCTIONS:    [x]Prescriptions sent with you.  Use as directed.  When taking pain medications, you may experience dizziness or drowsiness.  Do not drink alcohol or drive when taking these medications.  [x]Continue preop medications                               Post-procedure Care   If any tissue was removed:   It will be sent to a lab to be examined. It may take 1-2 weeks for results. The doctor will usually give an initial report after the scope is removed. Other tests may be recommended.   A small amount of bleeding may occur during the first few days after the procedure.     When you return home after the procedure, be sure to follow your doctor's instructions, which may include:   Resume medicines as instructed by your doctor.   Resume normal diet, unless directed otherwise by your doctor.   The sedative will make you drowsy. Avoid driving, operating machinery, or making important decisions for the rest of the day.   Rest for the remainder of the day.     After arriving home, contact your doctor if any of the following occurs:   Bleeding from your rectum, notify your doctor if you pass a teaspoonful of blood or more.   Black, tarry stools   Severe abdominal pain   Hard, swollen abdomen   Signs of infection, including fever or chills   Inability to pass gas or  stool   Coughing, shortness of breath, chest pain, severe nausea or vomiting     In case of an emergency, CALL 911 .        FOLLOW-UP CARE:  [x]Call the office at [phone] for follow-up appointment in 1 week

## 2017-05-14 ENCOUNTER — Ambulatory Visit
Admit: 2017-05-14 | Discharge: 2017-05-14 | Payer: MEDICARE | Attending: Critical Care Medicine | Primary: Family Medicine

## 2017-05-14 DIAGNOSIS — J45909 Unspecified asthma, uncomplicated: Secondary | ICD-10-CM

## 2017-05-14 LAB — SPIROMETRY WITHOUT BRONCHODILATOR
Expiratory Time: 7.64 s
FEF 25-75% %Pred-Pre: 92 L/sec
FEF 25-75% Pred: 6.74 L/sec
FEF 25-75%-Pre: 2.52 L/sec
FEV1 %Pred-Pre: 105 %
FEV1 Pred: 2.76 L
FEV1/FVC %Pred-Pre: 102 %
FEV1/FVC Pred: 76 %
FEV1/FVC: 78 %
FEV1: 2.92 L
FVC %Pred-Pre: 101 %
FVC Pred: 3.68 L
FVC: 3.74 L

## 2017-05-14 LAB — NITRIC OXIDE: Parts Per Billion: 47

## 2017-05-14 NOTE — Patient Instructions (Addendum)
Patient Education        Asthma in Adults: Care Instructions  Your Care Instructions    During an asthma attack, your airways swell and narrow as a reaction to certain things (triggers). This makes it hard to breathe.  You may be able to prevent asthma attacks if you avoid the things that set off your asthma symptoms. Keeping your asthma under control and treating symptoms before they get bad can help you avoid severe attacks.  If you can control your asthma, you may be able to do all of your normal daily activities. You may also avoid asthma attacks and trips to the hospital.  Follow-up care is a key part of your treatment and safety. Be sure to make and go to all appointments, and call your doctor if you are having problems. It's also a good idea to know your test results and keep a list of the medicines you take.  How can you care for yourself at home?   Follow your asthma action plan so you can manage your symptoms at home. An asthma action plan will help you prevent and control airway reactions and will tell you what to do during an asthma attack. If you do not have an asthma action plan, work with your doctor to build one.   Take your asthma medicine exactly as prescribed. Medicine plays an important role in controlling asthma. Talk to your doctor right away if you have any questions about what to take and how to take it.  ? Use your quick-relief medicine when you have symptoms of an attack. Quick-relief medicine often is an albuterol inhaler. Some people need to use quick-relief medicine before they exercise.  ? Take your controller medicine every day, not just when you have symptoms. Controller medicine is usually an inhaled corticosteroid. The goal is to prevent problems before they occur. Do not use your controller medicine to try to treat an attack that has already started. It does not work fast enough to help.  ? If your doctor prescribed corticosteroid pills to use during an attack, take them as  directed. They may take hours to work, but they may shorten the attack and help you breathe better.  ? Keep your quick-relief medicine with you at all times.   Talk to your doctor before using other medicines. Some medicines, such as aspirin, can cause asthma attacks in some people.   Check yourself for asthma symptoms to know which step to follow in your action plan. Watch for things like being short of breath, having chest tightness, coughing, and wheezing. Also notice if symptoms wake you up at night or if you get tired quickly when you exercise.   If you have a peak flow meter, use it to check how well you are breathing. This can help you predict when an asthma attack is going to occur. Then you can take medicine to prevent the asthma attack or make it less severe.   See your doctor regularly. These visits will help you learn more about asthma and what you can do to control it. Your doctor will monitor your treatment to make sure the medicine is helping you.   Keep track of your asthma attacks and your treatment. After you have had an attack, write down what triggered it, what helped end it, and any concerns you have about your asthma action plan. Take your diary when you see your doctor. You can then review your asthma action plan and decide if it   is working.   Do not smoke or allow others to smoke around you. Avoid smoky places. Smoking makes asthma worse. If you need help quitting, talk to your doctor about stop-smoking programs and medicines. These can increase your chances of quitting for good.   Learn what triggers an asthma attack for you, and avoid the triggers when you can. Common triggers include colds, smoke, air pollution, dust, pollen, mold, pets, cockroaches, stress, and cold air.   Avoid colds and the flu. Get a pneumococcal vaccine shot. If you have had one before, ask your doctor whether you need a second dose. Get a flu vaccine every fall. If you must be around people with colds or the  flu, wash your hands often.  When should you call for help?  Call 911 anytime you think you may need emergency care. For example, call if:    You have severe trouble breathing.   Call your doctor now or seek immediate medical care if:    Your symptoms do not get better after you have followed your asthma action plan.     You cough up yellow, dark brown, or bloody mucus (sputum).   Watch closely for changes in your health, and be sure to contact your doctor if:    Your coughing and wheezing get worse.     You need to use quick-relief medicine on more than 2 days a week (unless it is just for exercise).     You need help figuring out what is triggering your asthma attacks.   Where can you learn more?  Go to [email or web address] and sign in to your MyChart account. Enter P597 in the Search Health Information box to learn more about "Asthma in Adults: Care Instructions."     If you do not have an account, please click on the "Sign Up Now" link.  Current as of: May 20, 2016  Content Version: 11.8   2006-2018 [employer]. Care instructions adapted under license by [facility]. If you have questions about a medical condition or this instruction, always ask your healthcare professional. [employer] disclaims any warranty or liability for your use of this information.

## 2017-05-14 NOTE — Progress Notes (Signed)
Patient to follow up with physician in June.

## 2017-05-14 NOTE — Progress Notes (Signed)
[facility]    Department of Internal Medicine  Division of Pulmonary, Critical Care & Sleep Medicine    [name], MD    We had the pleasure of seeing  Julian Castaneda regarding his chronic sinusitis, GERD S/p Nissen, reactive airway disease & obstructive sleep apnea.     HISTORY OF PRESENT ILLNESS:    Julian Castaneda is a 76 y.o. year old male who has a long standing history of allergies, elevated Ig E level around 600 requiring the administration of Xolair therapy. 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 Last level in 06/2010 was negative. The her recall He under when endoscopic surgery on 06/30/07 antrostomy bilateral with ethmoidectomies and left single sphenoidotomies and septoplasty. His treatment is working well, which included nasal rinses, Patanase, Astelin, and Xolair. I will  With his sleep apnea he is very compliant using the BiPAP machine nightly for at least 6 hour. His current pressure are 18/ 9 cm H20.     Since the last visit Julian Castaneda is doing well. We discussed his trip to [place], his wife's memory and recent death and health and getting a new condo. He has a elevated PSA and a recent biopsy done showed prostate cancer Julian [facility]aneda score 6). MRI was done and ok.  He is holding on treatment.  He has had no pulmonary complaint.  He has been compliant with his CPAP and uses it nightly. His ACT is 22/25. He has no CP, SOB or leg edema. No neurologic symptoms. He has long standing GERD requiring anti-reflux surgery years ago. His last EGD which was negative by Dr. Lavone Nian he has had no repeat complaints. His antireflux surgery anastomosis was stable and clear.  He will return in a 6 months.        ALLERGIES:     Allergies   Allergen Reactions   . Enalapril Swelling     Of lower lip   . Esomeprazole Hives and Swelling   . Nexium [Esomeprazole Magnesium Trihydrate] Swelling   . Nsaids Swelling   . Ranitidine Hives and Swelling   . Reglan [Metoclopramide Hcl] Other (See Comments)     States has bloating with this       PAST MEDICAL HISTORY:       Diagnosis Date   . Allergy    . Asthma     doing well per patient   . Chronic pansinusitis 10/03/2014   . COPD (chronic obstructive pulmonary disease) (HCC)    . CSS (Churg-Strauss syndrome) (HCC)     no current issues   . Elevated PSA     negative biospy   . Encounter for screening colonoscopy     and EGD 12-28-16    . GERD (gastroesophageal reflux disease)    . Hyperlipidemia    . Hypertension    . OSA treated with BiPAP 03/27/2014   . Sepsis (HCC)     06-2015 - resolved    . Sinusitis         MEDICATIONS:   Current Outpatient Prescriptions   Medication Sig Dispense Refill   . vitamin D (CHOLECALCIFEROL) 1000 UNIT TABS tablet Take 1,000 Units by mouth daily LD 12-23-16     . finasteride (PROSCAR) 5 MG tablet Take  5 mg by mouth daily States he will not take until he is done with his daily antibiotics     . colestipol (COLESTID) 1 G tablet Take 1 g by mouth 2 times daily     . amLODIPine (NORVASC) 2.5 MG tablet Take 2.5 mg by mouth nightly     . hydrochlorothiazide (HYDRODIURIL) 25 MG tablet Take 25 mg by mouth daily.       Marland Kitchen. atorvastatin (LIPITOR) 80 MG tablet Take 80 mg by mouth daily.       No current facility-administered medications for this visit.        SOCIAL AND OCCUPATIONAL HEALTH:  He is married with four children. One son died from HF, daughter died from CAD all young. Wife has dementia (advanced). The patient is a never smoked  There is not history of TB or TB exposure.  No Etoh Abuse.  There is not asbestos or silica dust exposure.  The patient reports no coal, foundry, quarry or [employer] exposure.  Travel history reveals no trips.  Denies history of recreational or IV  drug use. Denies hot tub exposure. The patient has no pets. Hobbies include watching soap opera.    Social History   Substance Use Topics   . Smoking status: Never Smoker   . Smokeless tobacco: Never Used   . Alcohol use No       SURGICAL HISTORY:   Past Surgical History:   Procedure Laterality Date   . COLONOSCOPY     . COLONOSCOPY N/A 12/28/2016    [facility]   . CYSTOSCOPY      prostate biopsy - 06/2015    . ENDOSCOPY, COLON, DIAGNOSTIC  01/22/2014   . GASTRIC FUNDOPLICATION  2005   . HERNIA REPAIR     . NASAL POLYP SURGERY     . PROSTATE BIOPSY  2011    negative pathology   . SEPTOPLASTY     . TUMOR REMOVAL  11/2010    fatty tumor removed Dr Lavone NianAwad   . UPPER GASTROINTESTINAL ENDOSCOPY  12/2009    with biopsy, pathology negative   . UPPER GASTROINTESTINAL ENDOSCOPY N/A 12/28/2016    [facility]ir [facility]       FAMILY HISTORY:   Family History   Problem Relation Age of Onset   . High Blood Pressure Father    . High Cholesterol Father    . Asthma Sister    . Diabetes Sister        REVIEW OF SYSTEMS:   Constitutional: General health good . No weight changes.  No fevers, fatigue or weakness.   Head: Patient denies any history of trauma, convulsive disorder or syncope.    Skin:  Patient denies history of changes in pigmentation, eruptions or pruritus.  No easy bruising or bleeding.  EENT: Patient denies any history of color blindness, photophobia, diplopia, inflammation, cataracts or glaucoma.   Patient denies history of deafness, tinnitus, pain, discharge or recurrent infections.    Patient has a long history of rhinitis, chronic nasal discharge, drainage, & nasal polyps; all requiring surgery. Patient denies history of soreness of mouth or tongue.   Patient denies history of hoarseness, voice changes, sore throats or tonsillitis.    Lymphatic:  Patient denies history of enlargement, inflammation, pain or suppuration.     Cardiovascular:  Patient denies history of palpations, heart murmur, irregular rhythm, chest pain, exertional dyspnea, cyanosis, ascites,  rheumatic fever, cold extremities or edema.  Respiration:  Patient denies wheezing, dyspnea, nocturnal dyspnea, cough, hemoptysis, pleurisy, TB or asthma.   Known OSA on [place] 19/ 8 cm H20. 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   Gastrointestinal: Patient denies changes in appetite. No dysphagia, odynophagia or abdominal pain.  No hematochezia, melena, bowel habit changes or hemorrhoids.    No jaundice. GERD Gastic surgery in past with poor results.   Genitourinary:   Patient denies dysuria, frequency, urgency or incontinence.  Complains of difficulty in starting or stopping urinary stream.    Reports history of stones which is followed by [facility].  He has benign prostatic hypertrophy. (2017) prostate cancer. + Cancer Gleeson 6 + Biopsy.   Musculoskeletal: The patient denies history of arthritis or joint pains.  No loss of strength or dislocation.  Breasts:  No history of masses, lumps, pain, nipple changes or painful nipples.  Neurological: Patient denies vertigo, syncope.  No twitching or memory.   Psychological: Patient denies moodiness, depression or anxiety.  No obsessions or hallucinations.    Endocrine:  No history of goiter, exophthalmos or dryness of skin.  No polyuria or polyphagia.  No diabetes.   Hematopoietic:  No history of bleeding disorders or easy bruising.  Rheumatic:  No polyarthritis or inflammatory joint disease. History of positive P-ANCA    PHYSICAL EXAMINATION:  Vitals:    05/14/17 1011   BP: (!) 168/79   Pulse: 59   Resp: 12   Temp: 97.1 F (36.2 C)   SpO2: 98%   Weight: 190 lb (86.2 kg)   Height: 5' 7.5" (1.715 m)     Constitutional: A 76 y.o.  male  who is alert, oriented, cooperative and in no apparent distress.  Head was normocephalic and atraumatic.  Male patten baldness.   EENT: Eyes were equal and reactive to light and accommodation.   Extraocular muscles intact.  No conjunctival injections.  External canals are patent.   No discharge was appreciated.  Septum was mildly deviated, mucosa was without erythema, exudates or cobblestoning.  No thrush was noted.  Swollen turbinates.   Neck: Supple without thyromegaly. No elevated JVP. Trachea was midline. No carotid bruits.    Respiratory: Symmetrical without dullness to percussion.  Breath sounds bilaterally were clear to auscultation.   No wheezes, rhonchi or rales. No intercostal retraction or use of accessory muscles. No egophony noted.   Cardiovascular: Regular without murmur, clicks, gallops or rubs.  No left or right ventricular heave.    Pulses:  Equal bilaterally.    Abdomen: Slightly rounded, soft without organomegaly. No rebound, rigidity or guarding.  Surgical (+) scars  Lymphatic: No lymphadenopathy.  Musculoskeletal:No gross muscle weakness.    Extremities:  No lower extremity edema.  Muscle size, tone and strength are normal.    Sensory function appears intact.  Deep tendon reflexes are normal.   Skin:  Warm and dry.  Good color, turgor and pigmentation. No lesions. Varicoties noted. No rash.   Neurological/Psychiatric: The patient's general behavior, level of consciousness, thought content and emotional status is normal.  Cranial nerves II-XII are intact.      DATA:     Today Spirometry compared to previous demonstrates an FVC of 3.80 which is 102 % of predicted with an FEV1 of 2.89 liters which is 103 % of predicted.  FEV1/FVC ratio is 76%.  Maximum voluntary ventilation is 128 liters per minute or 99 % of predicted.  Flow volume loop shows no signs  of intrathoracic or extrathoracic obstruction. Impression. Normal.    Asthma Control Test [ACT] is a 5 part questionnaire, which has a point value system that ranges from 1- 5. This test is a validated questionnaire to help determine asthma control.  A total score of 19 or below indicates that the patients asthma may not be well controlled.  If used in conjunction with PFTs, the correlation of determining asthma control is even stronger.  Today on testing the ACT was 23 out of max of 25. Impression: Poor control.     Epworth Sleepiness Scale is use this Tool to Measure Sleep Deprivation. The Epworth Sleepiness Scale was developed by researchers in [place] and is widely used by sleep professionals around the world to measure sleep deprivation.  How likely are you to doze off or fall asleep in the following situations, in contrast to feeling just tired? This refers to your usual way of life in recent times.  Results: Total Epworth Score: 3  Interpretation: No hypersomnolence.    IMPRESSION:      Julian Castaneda a 76 y.o. male reactive airway disease, chronic cough from GERD S/p Nissen and severe nasal polyposis s/p surgery x 2. While we have never proven the systemic vasculitis or necrotizing vasculitis part and he has no renal, neuropathic or cardiovascular involvement; the finding of chronic sinusitis, eosinophilia, pANCA (+) and asthma aid in the diagnosis.  He has no neuropathy or neuropathic mononeuritis. His p-ANCA was 1:160 but fluctuates .  However, the clinical utility of ANCA in CSS raises a controversial issue of the sensitivity and specificity of ANCA in systemic vasculitis. In CSS P-ANCA is 50%.  He should never receive leukotriene modifier, allopurinol, propylthiouracil and quinine with this history of CSS for it will cause active flairs.  SO we have been monitoring and he is without flair or recurrence = false + test.  With his chronic allergies, sinusitis with rhinitis and nasal  polyposis is stable after surgery. He also has a failed Nissen fundoplication with chronic gastroparesis and GERD stable based on last. As for his sleep apnea he is doing very well with his BiPAP.  With this in mind, we would like to proceed with the following;                PLAN:    It is always good to see Julian Castaneda. He is still walking miles in the morning.  His wife passed away and we reflected on her death and discussed the issues with hospice and the cemetery. [place] [employer] [place] a [place] there[place]  We have stopped the Xolair for his allergies and elevated IgE level and he is without a decline. Sleep apnea symptoms are excellent and we see no need to adjust therapy.  We discussed the death of his son and daughter and declining health of his wife (dementia).  He will follow up in 6 months to a year for re-evaluation.  Vaccines were reviewed and current. We hope this updates you on my evaluation and clinical thinking. Thank you for allowing us to participate in Julian Castaneda care.     Sincerely,    Elmer Rampimothy J. Mikisha Roseland, D.O., MPH, Ane PaymentFCCP, FACOI, FACP  Professor of Medicine  Director, [facility]

## 2017-05-27 ENCOUNTER — Inpatient Hospital Stay
Admit: 2017-05-27 | Discharge: 2017-05-27 | Disposition: A | Discharge status: Home / Self Care | Payer: PRIVATE HEALTH INSURANCE

## 2017-05-27 DIAGNOSIS — S86911A Strain of unspecified muscle(s) and tendon(s) at lower leg level, right leg, initial encounter: Secondary | ICD-10-CM

## 2017-05-27 MED ORDER — TRAMADOL HCL 50 MG PO TABS
50 MG | ORAL_TABLET | Freq: Four times a day (QID) | ORAL | 0 refills | Status: AC | PRN
Start: 2017-05-27 — End: 2017-06-01

## 2017-05-27 MED ORDER — LIDOCAINE 5 % EX PTCH
5 % | MEDICATED_PATCH | CUTANEOUS | 0 refills | Status: AC
Start: 2017-05-27 — End: 2017-06-06

## 2017-05-27 MED ORDER — METHOCARBAMOL 500 MG PO TABS
500 MG | ORAL_TABLET | Freq: Three times a day (TID) | ORAL | 0 refills | Status: AC
Start: 2017-05-27 — End: 2017-06-01
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
## 2017-07-13 ENCOUNTER — Inpatient Hospital Stay: Admit: 2017-07-13 | Discharge: 2017-07-13 | Payer: PRIVATE HEALTH INSURANCE | Primary: Family Medicine

## 2017-07-13 DIAGNOSIS — S86811A Strain of other muscle(s) and tendon(s) at lower leg level, right leg, initial encounter: Secondary | ICD-10-CM

## 2017-07-13 NOTE — Other (Signed)
[facility]  Phone: [phone] Fax: [phone]     Physical Therapy  Out Patient Initial Evaluation    Date:  07/13/2017    Patient Name:  Julian Castaneda    DOB:  [birth year]  MRN: [id number]    DIAGNOSIS:  Strain Right Calf   ICD 10 Code: S86.111D  EVALUATION DATE:  07/13/2017  REFERRING PHYSICIAN:  Dr Landis MartinsAgnew  ONSET DATE:  05/27/2017  CERTIFICATION PERIOD:  07/13/2017 to 08/31/2017    PROBLEMS FOUND DURING EVALUATION   Pain affecting the mid belly of the right gastroc/soleus complex. Pain noted with direct palpation and stretching of the area but not with muscle contraction    Deficits of strength right calf region     SHORT TERM GOALS   Patient will report a consistent reduction in acute right calf pain    Establish HEP    LONG TERM GOALS   Right calf pain 2/10 or less. Pain will not impact ambulation or ADl's    Strength Right LE 4+/5 or better   Patient will be able to maintain and self direct an appropriate follow up independent exercise program     PATIENT GOALS   Resolve pain right calf    REHAB POTENTIAL:  Good    FREQUENCY/DURATION:  1-2 times per week 3 weeks     PLAN OF CARE:  Modalities HEP       Thank you for the opportunity to work with your patient. If you have questions or comments, please feel free to contact me by phone or fax.      Electronically Signed by Stann Mainlandonald Surie Suchocki  PT [phone]        ___________________________________  07/13/2017    Physician     Date
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
## 2017-07-14 ENCOUNTER — Inpatient Hospital Stay: Admit: 2017-07-14 | Discharge: 2017-07-14 | Payer: PRIVATE HEALTH INSURANCE | Primary: Family Medicine

## 2017-07-14 NOTE — Progress Notes (Signed)
Lanora Manis. [facility]  Phone: [phone] Fax: [phone]       Physical Therapy Daily Treatment Note  Date:  07/14/2017    Patient Name:  Julian Castaneda    DOB:  01/13/1941  MRN: [id number]    Restrictions/Precautions:    Diagnosis:  Right Calf Strain   Treatment Diagnosis:    Insurance/Certification information:  Medicare   Referring Physician:  Dr Landis MartinsAgnew   Plan of care signed (Y/N):    Visit# / total visits:  2/  Pain level: /10  Time In: 1325       Time Out:  1400        Subjective:      Exercises:  Exercise/Equipment Resistance/Repetitions Other comments                                                                                                                                             Other Therapeutic Activities:      Home Exercise Program:      Manual Treatments:      Modalities:  US Right calf 7 min                         Interferential Estim Right Calf 20 min     Timed Code Treatment Minutes:  30    Total Treatment Minutes:  35    Treatment/Activity Tolerance:  [x]  Patient tolerated treatment well []  Patient limited by fatigue  []  Patient limited by pain  []  Patient limited by other medical complications  []  Other:     Prognosis: [x]  Good []  Fair  []  Poor    Patient Requires Follow-up: [x]  Yes  []  No    Plan:   [x]  Continue per plan of care []  Alter current plan (see comments)  []  Plan of care initiated []  Hold pending MD visit []  Discharge  Plan for Next Session:         Electronically signed by:  Stann Mainlandonald Cricket Goodlin, PT [phone]

## 2017-07-19 ENCOUNTER — Inpatient Hospital Stay: Admit: 2017-07-19 | Discharge: 2017-07-19 | Payer: PRIVATE HEALTH INSURANCE | Primary: Family Medicine

## 2017-07-19 DIAGNOSIS — S86111D Strain of other muscle(s) and tendon(s) of posterior muscle group at lower leg level, right leg, subsequent encounter: Secondary | ICD-10-CM

## 2017-07-19 NOTE — Progress Notes (Signed)
Lanora Manis. [facility]  Phone: [phone] Fax: [phone]       Physical Therapy Daily Treatment Note  Date:  07/19/2017    Patient Name:  Rana SnareVincent J Husk    DOB:  January 29, 1941  MRN: [id number]    Restrictions/Precautions:    Diagnosis:  Right Calf Strain   Treatment Diagnosis:    Insurance/Certification information:  Medicare   Referring Physician:  Dr Landis MartinsAgnew   Plan of care signed (Y/N):    Visit# / total visits:  3/  Pain level: /10  Time In: 1025       Time Out:  1050        Subjective:      Exercises:  Exercise/Equipment Resistance/Repetitions Other comments                                                                                                                                             Other Therapeutic Activities:      Home Exercise Program:      Manual Treatments:      Modalities:  US Right calf 7 min nt                        Interferential Estim Right Calf 20 min     Timed Code Treatment Minutes:  15    Total Treatment Minutes:  25    Treatment/Activity Tolerance:  [x]  Patient tolerated treatment well []  Patient limited by fatigue  []  Patient limited by pain  []  Patient limited by other medical complications  []  Other:     Prognosis: [x]  Good []  Fair  []  Poor    Patient Requires Follow-up: [x]  Yes  []  No    Plan:   [x]  Continue per plan of care []  Alter current plan (see comments)  []  Plan of care initiated []  Hold pending MD visit []  Discharge  Plan for Next Session:         Electronically signed by:  Stann Mainlandonald Brayden Betters, PT [phone]

## 2017-07-21 ENCOUNTER — Inpatient Hospital Stay: Admit: 2017-07-21 | Discharge: 2017-07-21 | Payer: PRIVATE HEALTH INSURANCE | Primary: Family Medicine

## 2017-07-21 NOTE — Progress Notes (Signed)
Lanora Manis. [facility]  Phone: [phone] Fax: [phone]       Physical Therapy Daily Treatment Note  Date:  07/21/2017    Patient Name:  Julian Castaneda    DOB:  11-11-1940  MRN: [id number]    Restrictions/Precautions:    Diagnosis:  Right Calf Strain   Treatment Diagnosis:    Insurance/Certification information:  Medicare   Referring Physician:  Dr Landis MartinsAgnew   Plan of care signed (Y/N):    Visit# / total visits:  4/  Pain level: /10  Time In: 1025       Time Out:  1050        Subjective:      Exercises:  Exercise/Equipment Resistance/Repetitions Other comments                                                                                                                                             Other Therapeutic Activities:      Home Exercise Program:      Manual Treatments:      Modalities:  US Right calf 7 min nt                        Interferential Estim Right Calf 20 min     Timed Code Treatment Minutes:  15    Total Treatment Minutes:  25    Treatment/Activity Tolerance:  [x]  Patient tolerated treatment well []  Patient limited by fatigue  []  Patient limited by pain  []  Patient limited by other medical complications  []  Other:     Prognosis: [x]  Good []  Fair  []  Poor    Patient Requires Follow-up: [x]  Yes  []  No    Plan:   [x]  Continue per plan of care []  Alter current plan (see comments)  []  Plan of care initiated []  Hold pending MD visit []  Discharge  Plan for Next Session:         Electronically signed by:  Stann Mainlandonald Shatavia Santor, PT [phone]

## 2017-07-26 ENCOUNTER — Inpatient Hospital Stay: Admit: 2017-07-26 | Discharge: 2017-07-26 | Payer: PRIVATE HEALTH INSURANCE | Primary: Family Medicine

## 2017-07-26 NOTE — Progress Notes (Signed)
Lanora Manis. [facility]  Phone: [phone] Fax: [phone]       Physical Therapy Daily Treatment Note  Date:  07/26/2017    Patient Name:  Julian Castaneda    DOB:  [birth year]  MRN: [id number]    Restrictions/Precautions:    Diagnosis:  Right Calf Strain   Treatment Diagnosis:    Insurance/Certification information:  Medicare   Referring Physician:  Dr Landis MartinsAgnew   Plan of care signed (Y/N):    Visit# / total visits:  5/  Pain level: /10  Time In: 1025       Time Out:  1050        Subjective:      Exercises:  Exercise/Equipment Resistance/Repetitions Other comments                                                                                                                                             Other Therapeutic Activities:      Home Exercise Program:      Manual Treatments:      Modalities:  US Right calf 7 min nt                        Interferential Estim Right Calf 20 min     Timed Code Treatment Minutes:  15    Total Treatment Minutes:  25    Treatment/Activity Tolerance:  [x]  Patient tolerated treatment well []  Patient limited by fatigue  []  Patient limited by pain  []  Patient limited by other medical complications  []  Other:     Prognosis: [x]  Good []  Fair  []  Poor    Patient Requires Follow-up: [x]  Yes  []  No    Plan:   [x]  Continue per plan of care []  Alter current plan (see comments)  []  Plan of care initiated []  Hold pending MD visit []  Discharge  Plan for Next Session:         Electronically signed by:  Stann Mainlandonald Janeane Cozart, PT [phone]

## 2017-07-28 ENCOUNTER — Inpatient Hospital Stay: Admit: 2017-07-28 | Discharge: 2017-07-28 | Payer: PRIVATE HEALTH INSURANCE | Primary: Family Medicine

## 2017-07-28 NOTE — Progress Notes (Signed)
[place]  Phone: [phone] Fax: [phone]       Physical Therapy Daily Treatment Note  Date:  07/28/2017    Patient Name:  Julian Castaneda    DOB:  July 07, 1940  MRN: [id number]    Restrictions/Precautions:    Diagnosis:  Right Calf Strain   Treatment Diagnosis:    Insurance/Certification information:  Medicare   Referring Physician:  Dr Jiles Crocker   Plan of care signed (Y/N):    Visit# / total visits:  6/  Pain level: /10  Time In: 10:30       Time Out:  10:55       Subjective:  Pt states he has no right calf pain. States he can sleep and sit in his recliner without pain from pressure on his calf.     Exercises:  Exercise/Equipment Resistance/Repetitions Other comments                                                                                                                                             Other Therapeutic Activities:      Home Exercise Program:      Manual Treatments:      Modalities:  t                        Interferential Estim Right Calf 20 min     Timed Code Treatment Minutes:  20    Total Treatment Minutes:  25    Treatment/Activity Tolerance:  '[x]'  Patient tolerated treatment well '[]'  Patient limited by fatigue  '[]'  Patient limited by pain  '[]'  Patient limited by other medical complications  '[x]'  Other: Pt has completed current plan of care and met PT goals.   Right calf pain has resolved. Pt is ambulating functional distances without pain.     Pt is returning to [place] this weekend and voices confidence that right calf issues have resolved.     Prognosis: '[x]'  Good '[]'  Fair  '[]'  Poor    Patient Requires Follow-up: '[]'  Yes  '[x]'  No    Plan:   '[x]'  Continue per plan of care '[]'  Alter current plan (see comments)  '[]'  Plan of care initiated '[]'  Hold pending MD visit '[x]'  Discharge  Plan for Next Session:         Electronically signed by:  Burna Sis, LPTA  [phone]

## 2017-11-22 LAB — SPIROMETRY WITHOUT BRONCHODILATOR
Expiratory Time: 6.52 s
FEF 25-75% %Pred-Pre: 113 L/sec
FEF 25-75% Pred: 2.02 L/sec
FEF 25-75%-Pre: 2.3 L/sec
FEV1 %Pred-Pre: 102 %
FEV1 Pred: 2.74 L
FEV1/FVC %Pred-Pre: 103 %
FEV1/FVC Pred: 75 %
FEV1/FVC: 78 %
FEV1: 2.81 L
FVC %Pred-Pre: 98 %
FVC Pred: 3.66 L
FVC: 3.6 L
PEF %Pred-Pre: 126 L/sec
PEF Pred: 7.05 L/sec
PEF-Pre: 8.89 L/sec

## 2017-11-22 NOTE — Progress Notes (Addendum)
[facility]  [facility]  Department of Pulmonary, Critical Care and Sleep Medicine  [facility]  Department of Internal Medicine  Office Note      Dear [name], MD    We had the [facility]  [facility] regarding his chronic sinusitis, GERD S/p Nissen, reactive airway disease & obstructive sleep apnea.     HISTORY OF PRESENT ILLNESS:    Julian Castaneda is a 77 y.o. year old male who has a long standing history of allergies, elevated Ig E level around 600 requiring the administration of Xolair therapy. 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 Last level in 06/2010 was negative. The her recall He under when endoscopic surgery on 06/30/07 antrostomy bilateral with ethmoidectomies and left single sphenoidotomies and septoplasty. His treatment is working well, which included nasal rinses, Patanase, Astelin, and Xolair. I will  With his sleep apnea he is very compliant using the CPAP machine nightly for at least 6 hour.     Since the last visit, Julian Castaneda is doing well. We discussed his trip to [place], his wife's memory and recent death and health and getting a new condo. He has a elevated PSA and a recent biopsy done showed prostate cancer Julian Castaneda(Gleeson score 6). MRI was done and ok.  He is holding on treatment.  He has had no pulmonary complaint.  He has been compliant with his CPAP and uses it nightly. His ACT is 22/25. He has no CP, SOB or leg edema. No neurologic symptoms. He has long standing GERD requiring anti-reflux surgery years ago. His last EGD which was negative by [name] he has had no repeat complaints. His antireflux surgery anastomosis was stable and clear.  He will return in a 6  months.        ALLERGIES:    Allergies   Allergen Reactions   ??? Enalapril Swelling     Of lower lip   ??? Esomeprazole Hives and Swelling   ??? Nexium [Esomeprazole Magnesium Trihydrate] Swelling   ??? Nsaids Swelling   ??? Ranitidine Hives and Swelling   ??? Reglan [Metoclopramide Hcl] Other (See Comments)     States has bloating with this       PAST MEDICAL HISTORY:       Diagnosis Date   ??? Allergy    ??? Asthma     doing well per patient   ??? Chronic pansinusitis 10/03/2014   ??? COPD (chronic obstructive pulmonary disease) (HCC)    ??? CSS (Churg-Strauss syndrome) (HCC)     no current issues   ??? Elevated PSA     negative biospy   ??? Encounter for screening colonoscopy     and EGD 12-28-16    ??? GERD (gastroesophageal reflux disease)    ??? Hyperlipidemia    ??? Hypertension    ??? OSA treated with BiPAP 03/27/2014   ??? Sepsis (HCC)     06-2015 - resolved    ??? Sinusitis         MEDICATIONS:   Current Outpatient Medications   Medication Sig Dispense Refill   ??? vitamin D (CHOLECALCIFEROL) 1000 UNIT TABS tablet Take 1,000 Units by mouth daily LD 12-23-16     ??? finasteride (PROSCAR) 5  MG tablet Take 5 mg by mouth daily States he will not take until he is done with his daily antibiotics     ??? colestipol (COLESTID) 1 G tablet Take 1 g by mouth 2 times daily     ??? amLODIPine (NORVASC) 2.5 MG tablet Take 2.5 mg by mouth nightly     ??? hydrochlorothiazide (HYDRODIURIL) 25 MG tablet Take 25 mg by mouth daily.       ??? atorvastatin (LIPITOR) 80 MG tablet Take 80 mg by mouth daily.       No current facility-administered medications for this visit.        SOCIAL AND OCCUPATIONAL HEALTH:  He is married with four children. One son died from HF, daughter died from CAD all young. Wife has dementia (advanced). The patient is a never smoked  There is not history of TB or TB exposure.  No Etoh Abuse.  There is not asbestos or silica dust exposure.  The patient reports no coal, foundry, quarry or [employer] exposure.  Travel history reveals no trips.  Denies  history of recreational or IV drug use. Denies hot tub exposure. The patient has no pets. Hobbies include watching soap opera.  His wife passed away and we reflected on her death and discussed the issues with hospice and the cemetery.     Social History     Tobacco Use   ??? Smoking status: Never Smoker   ??? Smokeless tobacco: Never Used   Substance Use Topics   ??? Alcohol use: No     Alcohol/week: 0.0 oz   ??? Drug use: No       SURGICAL HISTORY:   Past Surgical History:   Procedure Laterality Date   ??? COLONOSCOPY     ??? COLONOSCOPY N/A 12/28/2016    COLONOSCOPY WITH BIOPSY performed by [name], MD at [facility] ENDOSCOPY   ??? CYSTOSCOPY      prostate biopsy - 06/2015    ??? ENDOSCOPY, COLON, DIAGNOSTIC  01/22/2014   ??? GASTRIC FUNDOPLICATION  2005   ??? HERNIA REPAIR     ??? NASAL POLYP SURGERY     ??? PROSTATE BIOPSY  2011    negative pathology   ??? SEPTOPLASTY     ??? TUMOR REMOVAL  11/2010    fatty tumor removed Dr Lavone Nian   ??? UPPER GASTROINTESTINAL ENDOSCOPY  12/2009    with biopsy, pathology negative   ??? UPPER GASTROINTESTINAL ENDOSCOPY N/A 12/28/2016    EGD BIOPSY [facility] by [name][facility] MD at [facility] ENDOSCOPY       FAMILY HISTORY:   Family History   Problem Relation Age of Onset   ??? High Blood Pressure Father    ??? High Cholesterol Father    ??? Asthma Sister    ??? Diabetes Sister        REVIEW OF SYSTEMS:   Constitutional: General health good . No weight changes.  No fevers, fatigue or weakness.   Head: Patient denies any history of trauma, convulsive disorder or syncope.    Skin:  Patient denies history of changes in pigmentation, eruptions or pruritus.  No easy bruising or bleeding.  EENT: Patient denies any history of color blindness, photophobia, diplopia, inflammation, cataracts or glaucoma.   Patient denies history of deafness, tinnitus, pain, discharge or recurrent infections.    Patient has a long history of rhinitis, chronic nasal discharge, drainage, & nasal polyps; all requiring surgery. Patient denies history of soreness of  mouth or tongue.   Patient denies  history of hoarseness, voice changes, sore throats or tonsillitis.    Lymphatic:  Patient denies history of enlargement, inflammation, pain or suppuration.    Cardiovascular:  Patient denies history of palpations, heart murmur, irregular rhythm, chest pain, exertional dyspnea, cyanosis, ascites, rheumatic fever, cold extremities or edema.  Respiration:  Patient denies wheezing, dyspnea, nocturnal dyspnea, cough, hemoptysis, pleurisy, TB or asthma.   Known OSA on PAP. 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   Gastrointestinal: Patient denies changes in appetite. No dysphagia, odynophagia or abdominal pain.  No hematochezia, melena, bowel habit changes or hemorrhoids.    No jaundice. GERD Gastic surgery in past with poor results.   Genitourinary:   Patient denies dysuria, frequency, urgency or incontinence.  Complains of difficulty in starting or stopping urinary stream.    Reports history of stones which is followed by [facility].  He has benign prostatic hypertrophy. (2017) prostate cancer. + Cancer Gleeson 6 + Biopsy.   Musculoskeletal: The patient denies history of arthritis or joint pains.  No loss of strength or dislocation.  Breasts:  No history of masses, lumps, pain, nipple changes or painful nipples.  Neurological: Patient denies vertigo, syncope.  No twitching or memory.   Psychological: Patient denies moodiness, depression or anxiety.  No obsessions or hallucinations.    Endocrine:  No history of goiter, exophthalmos or dryness of skin.  No polyuria or polyphagia.  No diabetes.   Hematopoietic:  No history of bleeding disorders or easy bruising.  Rheumatic:  No polyarthritis or inflammatory joint disease. History of positive P-ANCA    PHYSICAL EXAMINATION:  Vitals:    11/22/17 1325   BP: (!) 145/66   Site: Right Upper Arm   Position: Sitting   Pulse: 65   Resp: 16   Temp: 98.1 ??F (36.7 ??C)   SpO2: 95%   Weight: 188 lb 4.8 oz (85.4 kg)   Height: 5' 7.5" (1.715 m)      Constitutional: A 77 y.o.  male  who is alert, oriented, cooperative and in no apparent distress.  Head was normocephalic and atraumatic.  Male patten baldness.   EENT: Eyes were equal and reactive to light and accommodation.  No discharge was appreciated.  Septum was mildly deviated, mucosa was without erythema, exudates or cobblestoning.  No thrush.  Swollen turbinates.   Neck: Supple without thyromegaly. No elevated JVP. Trachea was midline. No carotid bruits.    Respiratory: Symmetrical without dullness to percussion.  Breath sounds bilaterally were clear to auscultation. No wheezes, rhonchi or rales. No egophony.   Cardiovascular: Regular without murmur, clicks, gallops or rubs.  No left or right ventricular heave.    Pulses:  Equal bilaterally.    Abdomen: Soft without organomegaly. No rebound, rigidity or guarding.  Surgical (+) scars  Lymphatic: No lymphadenopathy.  Musculoskeletal:No gross muscle weakness.    Extremities:  No lower extremity edema.  Muscle size, tone and strength are normal.    Sensory function appears intact.  Deep tendon reflexes are normal.   Skin:  Warm and dry.  Good color, turgor and pigmentation. No lesions. Varicoties noted. No rash.   Neurological/Psychiatric: General behavior, level of consciousness, thought content and emotional status is normal.  Cranial nerves II-XII are intact.      DATA:     Today Spirometry compared to previous demonstrates an FVC of 3.80 which is 102 % of predicted with an FEV1 of 2.89 liters which is 105 % of predicted.  FEV1/FVC ratio is 76%.  Maximum voluntary ventilation is 128 liters per minute or 99 % of predicted.  Flow volume loop shows no signs of intrathoracic or extrathoracic obstruction. Impression. Normal.    Asthma Control Test [ACT] is a 5 part questionnaire, which has a point value system that ranges from 1- 5. This test is a validated questionnaire to help determine asthma control.  A total score of 19 or below indicates that the  patients asthma may not be well controlled. If used in conjunction with PFTs, the correlation of determining asthma control is even stronger.  Today on testing the ACT was 23 out of max of 25. Impression: Poor control.     Epworth Sleepiness Scale is use this Tool to Measure Sleep Deprivation. The Epworth Sleepiness Scale was developed by researchers in [place] and is widely used by sleep professionals around the world to measure sleep deprivation.  How likely are you to doze off or fall asleep in the following situations, in contrast to feeling just tired? This refers to your usual way of life in recent times.  Results: Total Epworth Score: 3  Interpretation: No hypersomnolence.    IMPRESSION:      RUTILIO YELLOWHAIR a 77 y.o. male reactive airway disease, chronic cough from GERD S/p Nissen and severe nasal polyposis s/p surgery x 2. While we have never proven the systemic vasculitis or necrotizing vasculitis part and he has no renal, neuropathic or cardiovascular involvement; the finding of chronic sinusitis, eosinophilia, pANCA (+) and asthma aid in the diagnosis.  He has no neuropathy or neuropathic mononeuritis. His p-ANCA was 1:160 but fluctuates .  However, the clinical utility of ANCA in CSS raises a controversial issue of the sensitivity and specificity of ANCA in systemic vasculitis. In CSS P-ANCA is 50%.  He should never receive leukotriene modifier, allopurinol, propylthiouracil and quinine with this history of CSS for it will cause active flairs.  SO we have been monitoring and he is without flair or recurrence = false + test.  With his chronic allergies, sinusitis with rhinitis and nasal  polyposis is stable after surgery. He also has a failed Nissen fundoplication with chronic gastroparesis and GERD stable based on last. As for his sleep apnea he is doing very well with his CPAP.  With this in mind, we would like to proceed with the following;                PLAN:   We discussed his new place in  [place] and dating. It is always good to see [place]. He is still walking miles in the morning. We have stopped the Xolair for his allergies and elevated IgE level and he is without a decline. Sleep apnea symptoms are excellent and we see no need to adjust therapy. However his machine is not working and a small was noted - we will get a new machine. He will follow up in 6 months to a year for re-evaluation.  Vaccines were reviewed and current. We hope this updates you on my evaluation and clinical thinking. Thank you for allowing [place] to participate in Julian Snare care.     Sincerely,    Elmer Ramp, D.O., MPH, [employer] [employer], FACP  Professor of Medicine  Director, [facility]

## 2017-11-22 NOTE — Patient Instructions (Addendum)
Patient Education        Learning About CPAP for Sleep Apnea  What is CPAP?    CPAP is a small machine that you use at home every night while you sleep. It increases air pressure in your throat to keep your airway open. When you have sleep apnea, this can help you sleep better so you feel much better. CPAP stands for "continuous positive airway pressure."  The CPAP machine will have one of the following:  ?? A mask that covers your nose and mouth  ?? Prongs that fit into your nose  ?? A mask that covers your nose only, the most common type. This type is called NCPAP. The N stands for "nasal."  Why is it done?  CPAP is usually the best treatment for obstructive sleep apnea. It is the first treatment choice and the most widely used. Your doctor may suggest CPAP if you have:  ?? Moderate to severe sleep apnea.  ?? Sleep apnea and coronary artery disease (CAD).  ?? Sleep apnea and heart failure.  How does it help?  ?? CPAP can help you have more normal sleep, so you feel less sleepy and more alert during the daytime.  ?? CPAP may help keep heart failure or other heart problems from getting worse.  ?? CPAP may help lower your blood pressure.  ?? If you use CPAP, your bed partner may also sleep better because you are not snoring or restless.  What are the side effects?  Some people who use CPAP have:  ?? A dry or stuffy nose and a sore throat.  ?? Irritated skin on the face.  ?? Sore eyes.  ?? Bloating.  If you have any of these problems, work with your doctor to fix them. Here are some things you can try:  ?? Be sure the mask or nasal prongs fit well.  ?? See if your doctor can adjust the pressure of your CPAP.  ?? If your nose is dry, try a humidifier.  ?? If your nose is runny or stuffy, try decongestant medicine or a steroid nasal spray. Be safe with medicines. Read and follow all instructions on the label. Do not use the medicine longer than the label says.  If these things do not help, you might try a different type of machine.  Some machines have air pressure that adjusts on its own. Others have air pressures that are different when you breathe in than when you breathe out. This may reduce discomfort caused by too much pressure in your nose.  Where can you learn more?  Go to [email or web address] and sign in to your MyChart account. Enter X266 in the Search Health Information box to learn more about "Learning About CPAP for Sleep Apnea."     If you do not have an account, please click on the "Sign Up Now" link.  Current as of: February 17, 2017  Content Version: 12.0  ?? 2006-2019 [employer]. Care instructions adapted under license by [facility]. If you have questions about a medical condition or this instruction, always ask your healthcare professional. Healthwise, Incorporated disclaims any warranty or liability for your use of this information.

## 2017-11-22 NOTE — Progress Notes (Signed)
Patient to follow up with physician in 6 months.  Patient needs a new cpap machine

## 2017-11-29 ENCOUNTER — Encounter

## 2018-05-17 ENCOUNTER — Encounter: Attending: Critical Care Medicine | Primary: Family Medicine

## 2018-05-30 ENCOUNTER — Ambulatory Visit
Admit: 2018-05-30 | Discharge: 2018-05-30 | Payer: MEDICARE | Attending: Critical Care Medicine | Primary: Family Medicine

## 2018-05-30 DIAGNOSIS — J4520 Mild intermittent asthma, uncomplicated: Secondary | ICD-10-CM

## 2018-05-30 LAB — SPIROMETRY WITHOUT BRONCHODILATOR
Expiratory Time: 6.71 s
FEF 25-75% %Pred-Pre: 96 L/sec
FEF 25-75% Pred: 2 L/sec
FEF 25-75%-Pre: 1.93 L/sec
FEV1 %Pred-Pre: 100 %
FEV1 Pred: 2.73 L
FEV1/FVC %Pred-Pre: 97 %
FEV1/FVC Pred: 75 %
FEV1/FVC: 73 %
FEV1: 2.73 L
FVC %Pred-Pre: 102 %
FVC Pred: 3.64 L
FVC: 3.73 L

## 2018-05-30 LAB — POCT NITRIC OXIDE: FeNO: 30 [ppb]

## 2018-05-30 NOTE — Progress Notes (Signed)
Patient to follow up with physician in 6 months.

## 2018-05-30 NOTE — Progress Notes (Signed)
[facility]  Department of Pulmonary, Critical Care and Sleep Medicine  [facility]  Department of Internal Medicine  Office Note      Dear [name], MD    We had the pleasure of seeing  [facility]incent [facility] regarding his chronic sinusitis, GERD S/p Nissen, reactive airway disease & obstructive sleep apnea.     HISTORY OF PRESENT ILLNESS:    Julian Castaneda is a 77 y.o. year old male who has a long standing history of allergies, elevated Ig E level around 600 requiring the administration of Xolair therapy. 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 Last level in 06/2010 was negative. The her recall He under when endoscopic surgery on 06/30/07 antrostomy bilateral with ethmoidectomies and left single sphenoidotomies and septoplasty. His treatment is working well, which included nasal rinses, Patanase, Astelin, and Xolair. I will  With his sleep apnea he is very compliant using the CPAP machine nightly for at least 6 hour. In 2019, he has a elevated PSA and a recent biopsy done showed prostate cancer [name](Gleeson score 6).  He is holding on treatment. He has long standing GERD requiring anti-reflux surgery years ago. His last EGD which was negative by [name] he has had no repeat complaints. His antireflux surgery anastomosis was stable and clear.     Since the last visit, [name] is doing well. He is still [place] and [place]  MRI was done and ok. [place] what appears to be [place], he underwent a echocardiogram and stress test. He has had no pulmonary complaint.  He has been compliant with his CPAP 12 cmH20 and uses it nightly. His ACT is 22/25. He has no  CP, SOB or leg edema. No neurologic symptoms.  He will return in a a year.      ALLERGIES:    Allergies   Allergen Reactions   ??? Enalapril Swelling     Of lower lip   ??? Esomeprazole Hives and Swelling   ??? Nexium [Esomeprazole Magnesium Trihydrate] Swelling   ??? Nsaids Swelling   ??? Ranitidine Hives and Swelling   ??? Reglan [Metoclopramide Hcl] Other (See Comments)     States has bloating with this       PAST MEDICAL HISTORY:       Diagnosis Date   ??? Allergy    ??? Asthma     doing well per patient   ??? Chronic pansinusitis 10/03/2014   ??? COPD (chronic obstructive pulmonary disease) (HCC)    ??? CSS (Churg-Strauss syndrome) (HCC)     no current issues   ??? Elevated PSA     negative biospy   ??? Encounter for screening colonoscopy     and EGD 12-28-16    ??? GERD (gastroesophageal reflux disease)    ??? Hyperlipidemia    ??? Hypertension    ??? OSA on CPAP    ??? OSA treated with BiPAP 03/27/2014   ??? Sepsis (HCC)     06-2015 - resolved    ??? Sinusitis         MEDICATIONS:   Current Outpatient Medications   Medication Sig Dispense Refill   ??? vitamin D (CHOLECALCIFEROL) 1000  UNIT TABS tablet Take 1,000 Units by mouth daily LD 12-23-16     ??? finasteride (PROSCAR) 5 MG tablet Take 5 mg by mouth daily States he will not take until he is done with his daily antibiotics     ??? colestipol (COLESTID) 1 G tablet Take 1 g by mouth 2 times daily     ??? amLODIPine (NORVASC) 2.5 MG tablet Take 2.5 mg by mouth nightly     ??? hydrochlorothiazide (HYDRODIURIL) 25 MG tablet Take 25 mg by mouth daily.       ??? atorvastatin (LIPITOR) 80 MG tablet Take 80 mg by mouth daily.       No current facility-administered medications for this visit.        SOCIAL AND OCCUPATIONAL HEALTH:  He is married with four children. One son died from HF, daughter died from CAD all young. Wife has dementia (advanced). The patient is a never smoked  There is not history of TB or TB exposure.  No Etoh Abuse.  There is not asbestos or silica dust exposure.  The patient reports no coal,  foundry, quarry or [employer] exposure.  Travel history reveals no trips.  Denies history of recreational or IV drug use. Denies hot tub exposure. The patient has no pets. Hobbies include watching soap opera.  His wife passed away and we reflected on her death and discussed the issues with hospice and the cemetery.     Social History     Tobacco Use   ??? Smoking status: Never Smoker   ??? Smokeless tobacco: Never Used   Substance Use Topics   ??? Alcohol use: No     Alcohol/week: 0.0 standard drinks   ??? Drug use: No       SURGICAL HISTORY:   Past Surgical History:   Procedure Laterality Date   ??? COLONOSCOPY     ??? COLONOSCOPY N/A 12/28/2016    COLONOSCOPY WITH BIOPSY performed by [name], MD at [facility] ENDOSCOPY   ??? CYSTOSCOPY      prostate biopsy - 06/2015    ??? ENDOSCOPY, COLON, DIAGNOSTIC  01/22/2014   ??? GASTRIC FUNDOPLICATION  2005   ??? HERNIA REPAIR     ??? NASAL POLYP SURGERY     ??? PROSTATE BIOPSY  2011    negative pathology   ??? SEPTOPLASTY     ??? TUMOR REMOVAL  11/2010    fatty tumor removed Dr Lavone Nian   ??? UPPER GASTROINTESTINAL ENDOSCOPY  12/2009    with biopsy, pathology negative   ??? UPPER GASTROINTESTINAL ENDOSCOPY N/A 12/28/2016    EGD BIOPSY [facility] by [name], MD at [facility] ENDOSCOPY       FAMILY HISTORY:   Family History   Problem Relation Age of Onset   ??? High Blood Pressure Father    ??? High Cholesterol Father    ??? Asthma Sister    ??? Diabetes Sister        REVIEW OF SYSTEMS:   Constitutional: General health good . No weight changes.  No fevers, fatigue or weakness.   Head: Patient denies any history of trauma, convulsive disorder or syncope.    Skin:  Patient denies history of changes in pigmentation, eruptions or pruritus.  No easy bruising or bleeding.  EENT: Patient denies any history of color blindness, photophobia, diplopia, inflammation, cataracts or glaucoma.   Patient denies history of deafness, tinnitus, pain, discharge or recurrent infections.    Patient has a long history of rhinitis, chronic nasal  discharge,  drainage, & nasal polyps; all requiring surgery. Patient denies history of soreness of mouth or tongue.   Patient denies history of hoarseness, voice changes, sore throats or tonsillitis.    Lymphatic:  Patient denies history of enlargement, inflammation, pain or suppuration.    Cardiovascular:  Patient denies history of palpations, heart murmur, irregular rhythm, chest pain, exertional dyspnea, cyanosis, ascites, rheumatic fever, cold extremities or edema.  Respiration:  Patient denies wheezing, dyspnea, nocturnal dyspnea, cough, hemoptysis, pleurisy, TB or asthma.   Known OSA on PAP. 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   Gastrointestinal: Patient denies changes in appetite. No dysphagia, odynophagia or abdominal pain.  No hematochezia, melena, bowel habit changes or hemorrhoids.    No jaundice. GERD Gastic surgery in past with poor results.   Genitourinary:   Patient denies dysuria, frequency, urgency or incontinence.  Complains of difficulty in starting or stopping urinary stream.    Reports history of stones which is followed by [facility].  He has benign prostatic hypertrophy. (2017) prostate cancer. + Cancer Gleeson 6 + Biopsy.   Musculoskeletal: The patient denies history of arthritis or joint pains.  No loss of strength or dislocation.  Breasts:  No history of masses, lumps, pain, nipple changes or painful nipples.  Neurological: Patient denies vertigo, syncope.  No twitching or memory.   Psychological: Patient denies moodiness, depression or anxiety.  No obsessions or hallucinations.    Endocrine:  No history of goiter, exophthalmos or dryness of skin.  No polyuria or polyphagia.  No diabetes.   Hematopoietic:  No history of bleeding disorders or easy bruising.  Rheumatic:  No polyarthritis or inflammatory joint disease. History of positive P-ANCA    PHYSICAL EXAMINATION:  Vitals:    05/30/18 1346   BP: (!) 163/72   Pulse: 56   Resp: 18   Temp: 97.5 ??F (36.4 ??C)   TempSrc: Oral   SpO2:  96%   Weight: 190 lb (86.2 kg)   Height: 5' 5.5" (1.664 m)     Constitutional: A 77 y.o.  male  who is alert, oriented, cooperative and in no apparent distress.  Head was normocephalic and atraumatic.  Male patten baldness.   EENT: Eyes were equal and reactive to light and accommodation.  No discharge was appreciated.  Septum was mildly deviated, mucosa was without erythema, exudates or cobblestoning.  No thrush.  Swollen turbinates.   Neck: Supple without thyromegaly. No elevated JVP. Trachea was midline. No carotid bruits.    Respiratory: Symmetrical without dullness to percussion.  Breath sounds bilaterally were clear to auscultation. No wheezes, rhonchi or rales. No egophony.   Cardiovascular: Regular without murmur, clicks, gallops or rubs.  No heave.    Pulses:  Equal bilaterally.    Abdomen: Soft without organomegaly. No rebound, rigidity or guarding.  Surgical (+) scars  Lymphatic: No lymphadenopathy.  Musculoskeletal:No gross muscle weakness.    Extremities:  No lower extremity edema.  Muscle size, tone and strength are normal.    Sensory function appears intact.  Deep tendon reflexes are normal.   Skin:  Warm and dry.  Good color, turgor and pigmentation. No lesions. Varicoties. No rash.   Neurological/Psychiatric: General behavior, level of consciousness, thought content and emotional status is normal.  Cranial nerves II-XII are intact.      DATA:     Today Spirometry compared to previous demonstrates an FVC of 3.80 which is 102 % of predicted with an FEV1 of 2.89 liters which is 105 % of  predicted.  FEV1/FVC ratio is 76%.  Maximum voluntary ventilation is 128 liters per minute or 99 % of predicted.  Flow volume loop shows no signs of intrathoracic or extrathoracic obstruction. Impression. Normal.    Asthma Control Test [ACT] is a 5 part questionnaire, which has a point value system that ranges from 1- 5. This test is a validated questionnaire to help determine asthma control.  A total score of 19 or  below indicates that the patients asthma may not be well controlled. If used in conjunction with PFTs, the correlation of determining asthma control is even stronger.  Today on testing the ACT was 23 out of max of 25. Impression: Poor control.     Epworth Sleepiness Scale is use this Tool to Measure Sleep Deprivation. The Epworth Sleepiness Scale was developed by researchers in [place] and is widely used by sleep professionals around the world to measure sleep deprivation.  How likely are you to doze off or fall asleep in the following situations, in contrast to feeling just tired? This refers to your usual way of life in recent times.  Results: Total Epworth Score: 3  Interpretation: No hypersomnolence.    IMPRESSION:      EXZAVIER RUDERMAN a 77 y.o. male reactive airway disease, chronic cough from GERD S/p Nissen and severe nasal polyposis s/p surgery x 2. While we have never proven the systemic vasculitis or necrotizing vasculitis part and he has no renal, neuropathic or cardiovascular involvement; the finding of chronic sinusitis, eosinophilia, pANCA (+) and asthma aid in the diagnosis.  He has no neuropathy or neuropathic mononeuritis. His p-ANCA was 1:160 but fluctuates.  However, the clinical utility of ANCA in CSS raises a controversial issue of the sensitivity and specificity of ANCA in systemic vasculitis. In CSS P-ANCA is 50%.  He should never receive leukotriene modifier, allopurinol, propylthiouracil and quinine with this history of CSS for it will cause active flairs.  So we have been monitoring and he is without flair or recurrence = false + test.  With his chronic allergies, sinusitis with rhinitis and nasal  polyposis is stable after surgery. He also has a failed Nissen fundoplication with chronic gastroparesis and GERD stable based on last. As for his sleep apnea he is doing very well with his CPAP @ 12 cm H20.  With this in mind, we would like to proceed with the following;                PLAN:    We are happy Cincent is doing well. It is always good to see him. He is still walking miles in the morning. Sleep apnea symptoms are excellent and we see no need to adjust therapy. We adjusted his new machine.  He will follow up in 6 months to a year for re-evaluation.  Vaccines were reviewed and current. We hope this updates you on my evaluation and clinical thinking. Thank you for allowing [place] to participate in Julian Snare care.     Sincerely,    Elmer Ramp, D.O., MPH, [employer], FACP  Professor of Medicine  Director, [facility]

## 2018-05-30 NOTE — Patient Instructions (Addendum)
Patient Education        Learning About CPAP for Sleep Apnea  What is CPAP?    CPAP is a small machine that you use at home every night while you sleep. It increases air pressure in your throat to keep your airway open. When you have sleep apnea, this can help you sleep better so you feel much better. CPAP stands for "continuous positive airway pressure."  The CPAP machine will have one of the following:  ?? A mask that covers your nose and mouth  ?? Prongs that fit into your nose  ?? A mask that covers your nose only, the most common type. This type is called NCPAP. The N stands for "nasal."  Why is it done?  CPAP is usually the best treatment for obstructive sleep apnea. It is the first treatment choice and the most widely used. Your doctor may suggest CPAP if you have:  ?? Moderate to severe sleep apnea.  ?? Sleep apnea and coronary artery disease (CAD).  ?? Sleep apnea and heart failure.  How does it help?  ?? CPAP can help you have more normal sleep, so you feel less sleepy and more alert during the daytime.  ?? CPAP may help keep heart failure or other heart problems from getting worse.  ?? CPAP may help lower your blood pressure.  ?? If you use CPAP, your bed partner may also sleep better because you are not snoring or restless.  What are the side effects?  Some people who use CPAP have:  ?? A dry or stuffy nose and a sore throat.  ?? Irritated skin on the face.  ?? Sore eyes.  ?? Bloating.  If you have any of these problems, work with your doctor to fix them. Here are some things you can try:  ?? Be sure the mask or nasal prongs fit well.  ?? See if your doctor can adjust the pressure of your CPAP.  ?? If your nose is dry, try a humidifier.  ?? If your nose is runny or stuffy, try decongestant medicine or a steroid nasal spray. Be safe with medicines. Read and follow all instructions on the label. Do not use the medicine longer than the label says.  If these things do not help, you might try a different type of machine.  Some machines have air pressure that adjusts on its own. Others have air pressures that are different when you breathe in than when you breathe out. This may reduce discomfort caused by too much pressure in your nose.  Where can you learn more?  Go to [email or web address] and sign in to your MyChart account. Enter X266 in the Search Health Information box to learn more about "Learning About CPAP for Sleep Apnea."     If you do not have an account, please click on the "Sign Up Now" link.  Current as of: February 17, 2017  Content Version: 12.1  ?? 2006-2019 Health[employer], Inc[employer]. Care instructions adapted under license by [facility]. If you have questions about a medical condition or this instruction, always ask your healthcare professional. Health[employer], Incorporated disclaims any warranty or liability for your use of this information.

## 2018-10-18 ENCOUNTER — Encounter

## 2019-01-31 ENCOUNTER — Encounter: Payer: MEDICARE | Attending: Critical Care Medicine | Primary: Family Medicine

## 2019-04-20 ENCOUNTER — Encounter: Attending: Critical Care Medicine | Primary: Family Medicine

## 2019-04-21 ENCOUNTER — Ambulatory Visit
Admit: 2019-04-21 | Discharge: 2019-04-21 | Payer: MEDICARE | Attending: Critical Care Medicine | Primary: Family Medicine

## 2019-04-21 ENCOUNTER — Encounter: Attending: Critical Care Medicine | Primary: Family Medicine

## 2019-04-21 DIAGNOSIS — J45909 Unspecified asthma, uncomplicated: Secondary | ICD-10-CM

## 2019-04-21 LAB — SPIROMETRY WITHOUT BRONCHODILATOR
Expiratory Time: 8.04 s
FEF 25-75% %Pred-Pre: 105 L/sec
FEF 25-75% Pred: 1.97 L/sec
FEF 25-75-Pre: 2.08 L/s
FEV1 %Pred-Pre: 90 %
FEV1 Pred: 2.7 L
FEV1/FVC %Pred-Pre: 103 %
FEV1/FVC Pred: 75 %
FEV1/FVC: 78 %
FEV1: 2.44 L
FVC %Pred-Pre: 86 %
FVC Pred: 3.62 L
FVC: 3.13 L
PEF %Pred-Pre: 114 L/sec
PEF Pred: 6.88 L/sec
PEF-Pre: 7.88 L/sec

## 2019-04-21 LAB — NITRIC OXIDE: Parts Per Billion: 36

## 2019-04-21 NOTE — Progress Notes (Signed)
[facility]  [facility]  Department of Pulmonary, Critical Care and Sleep Medicine  [facility]  Department of Internal Medicine  Office Note      Dear [name], MD    We had the pleasure of seeing  [facility] regarding his chronic sinusitis, GERD S/p Nissen, reactive airway disease & obstructive sleep apnea.     HISTORY OF PRESENT ILLNESS:    Julian Castaneda is a 78 y.o. year old male who has a long standing history of allergies, elevated Ig E level around 600 requiring the administration of Xolair therapy. 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 Last level in 06/2010 was negative. The her recall He under when endoscopic surgery on 06/30/07 antrostomy bilateral with ethmoidectomies and left single sphenoidotomies and septoplasty. His treatment is working well, which included nasal rinses, Patanase, Astelin, and Xolair. I will  With his sleep apnea he is very compliant using the CPAP machine nightly for at least 6 hour. In 2019, he has a elevated PSA and a recent biopsy done showed prostate cancer Julian Castaneda(Gleeson score 6).  He is holding on treatment. He has long standing GERD requiring anti-reflux surgery years ago. His last EGD which was negative by [name] he has had no repeat complaints. His antireflux surgery anastomosis was stable and clear. In 2019, MRI was done and ok. [place] what appears to be dehydration, he underwent a echocardiogram and stress test.     Since the last visit, Julian Castaneda is doing well. He has had no pulmonary complaint.  He has no new complaints.  Still little sore from doing some painting at his daughter in laws appartment.  We discussed the  election.  We discussed various other issues both political and financial.  As far as his sleep apnea is concerned, he has been compliant with his CPAP 12 cmH20 and uses it nightly. His ACT is 22/25. He has no CP, SOB or leg edema. No neurologic symptoms.  He went ahead and got his flu vaccination and for his sleep apnea his sleepiness score is normal.    ALLERGIES:    Allergies   Allergen Reactions   ??? Enalapril Swelling     Of lower lip   ??? Esomeprazole Hives and Swelling   ??? Nexium [Esomeprazole Magnesium Trihydrate] Swelling   ??? Nsaids Swelling   ??? Ranitidine Hives and Swelling   ??? Reglan [Metoclopramide Hcl] Other (See Comments)     States has bloating with this       PAST MEDICAL HISTORY:       Diagnosis Date   ??? Allergy    ??? Asthma     doing well per patient   ??? Chronic pansinusitis 10/03/2014   ??? COPD (chronic obstructive pulmonary disease) (HCC)    ??? CSS (Churg-Strauss syndrome) (HCC)     no current issues   ??? Elevated PSA     negative biospy   ??? Encounter for screening colonoscopy     and EGD 12-28-16    ??? GERD (gastroesophageal reflux disease)    ??? Hyperlipidemia    ??? Hypertension    ??? OSA on CPAP    ??? OSA treated with  BiPAP 03/27/2014   ??? Sepsis (HCC)     06-2015 - resolved    ??? Sinusitis         MEDICATIONS:   Current Outpatient Medications   Medication Sig Dispense Refill   ??? vitamin D (CHOLECALCIFEROL) 1000 UNIT TABS tablet Take 1,000 Units by mouth daily LD 12-23-16     ??? finasteride (PROSCAR) 5 MG tablet Take 5 mg by mouth daily States he will not take until he is done with his daily antibiotics     ??? colestipol (COLESTID) 1 G tablet Take 1 g by mouth 2 times daily     ??? amLODIPine (NORVASC) 2.5 MG tablet Take 2.5 mg by mouth nightly     ??? hydrochlorothiazide (HYDRODIURIL) 25 MG tablet Take 25 mg by mouth daily.       ??? atorvastatin (LIPITOR) 80 MG tablet Take 80 mg by mouth daily.       No current facility-administered medications for this visit.        SOCIAL AND OCCUPATIONAL HEALTH:  He is married  with four children. One son died from HF, daughter died from CAD all young. Wife has dementia (advanced). The patient is a never smoked  There is not history of TB or TB exposure.  No Etoh Abuse.  There is not asbestos or silica dust exposure.  The patient reports no coal, foundry, quarry or [employer] exposure.  Travel history reveals no trips.  Denies history of recreational or IV drug use. Denies hot tub exposure. The patient has no pets. Hobbies include watching soap opera.  His wife passed away and we reflected on her death and discussed the issues with hospice and the cemetery.     Social History     Tobacco Use   ??? Smoking status: Never Smoker   ??? Smokeless tobacco: Never Used   Substance Use Topics   ??? Alcohol use: No     Alcohol/week: 0.0 standard drinks   ??? Drug use: No       SURGICAL HISTORY:   Past Surgical History:   Procedure Laterality Date   ??? COLONOSCOPY     ??? COLONOSCOPY N/A 12/28/2016    COLONOSCOPY WITH BIOPSY performed by [name], MD at [facility]COPY   ??? CYSTOSCOPY      prostate biopsy - 06/2015    ??? ENDOSCOPY, COLON, DIAGNOSTIC  01/22/2014   ??? GASTRIC FUNDOPLICATION  2005   ??? HERNIA REPAIR     ??? NASAL POLYP SURGERY     ??? PROSTATE BIOPSY  2011    negative pathology   ??? SEPTOPLASTY     ??? TUMOR REMOVAL  11/2010    fatty tumor removed Dr Lavone Nian   ??? UPPER GASTROINTESTINAL ENDOSCOPY  12/2009    with biopsy, pathology negative   ??? UPPER GASTROINTESTINAL ENDOSCOPY N/A 12/28/2016    EGD BIOPSY performed by [name], MD at [facility] ENDOSCOPY       FAMILY HISTORY:   Family History   Problem Relation Age of Onset   ??? High Blood Pressure Father    ??? High Cholesterol Father    ??? Asthma Sister    ??? Diabetes Sister        REVIEW OF SYSTEMS:   Constitutional: General health good . No weight changes.  No fevers, fatigue or weakness.   Head: Patient denies any history of trauma, convulsive disorder or syncope.    Skin:  Patient denies history of changes in pigmentation, eruptions or pruritus.  No  easy bruising or  bleeding.  EENT: Patient denies any history of color blindness, photophobia, diplopia, inflammation, cataracts or glaucoma.   Patient denies history of deafness, tinnitus, pain, discharge or recurrent infections.    Patient has a long history of rhinitis, chronic nasal discharge, drainage, & nasal polyps; all requiring surgery. Patient denies history of soreness of mouth or tongue.   Patient denies history of hoarseness, voice changes, sore throats or tonsillitis.    Lymphatic:  Patient denies history of enlargement, inflammation, pain or suppuration.    Cardiovascular:  Patient denies history of palpations, heart murmur, irregular rhythm, chest pain, exertional dyspnea, cyanosis, ascites, rheumatic fever, cold extremities or edema.  Respiration:  Patient denies wheezing, dyspnea, nocturnal dyspnea, cough, hemoptysis, pleurisy, TB or asthma.   Known OSA on PAP. 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   Gastrointestinal: Patient denies changes in appetite. No dysphagia, odynophagia or abdominal pain.  No hematochezia, melena, bowel habit changes or hemorrhoids.    No jaundice. GERD Gastic surgery in past with poor results.   Genitourinary:   Patient denies dysuria, frequency, urgency or incontinence.  Complains of difficulty in starting or stopping urinary stream.    Reports history of stones which is followed by [facility].  He has benign prostatic hypertrophy. (2017) prostate cancer. + Cancer Gleeson 6 + Biopsy.   Musculoskeletal: The patient denies history of arthritis or joint pains.  No loss of strength or dislocation.  Breasts:  No history of masses, lumps, pain, nipple changes or painful nipples.  Neurological: Patient denies vertigo, syncope.  No twitching or memory.   Psychological: Patient denies moodiness, depression or anxiety.  No obsessions or hallucinations.    Endocrine:  No history of goiter, exophthalmos or dryness of skin.  No polyuria or polyphagia.  No diabetes.   Hematopoietic:  No history  of bleeding disorders or easy bruising.  Rheumatic:  No polyarthritis or inflammatory joint disease. History of positive P-ANCA    PHYSICAL EXAMINATION:  Vitals:    04/21/19 1420   BP: (!) 145/61   Pulse: 69   Resp: 16   Temp: 97.4 ??F (36.3 ??C)   SpO2: 98%   Weight: 190 lb (86.2 kg)   Height: 5' 7.5" (1.715 m)     Constitutional: A 78 y.o.  male  who is alert, oriented, cooperative and in no apparent distress.  Head was normocephalic and atraumatic.  Male patten baldness.   EENT: Eyes were equal and reactive to light and accommodation.  No discharge was appreciated.  Septum was mildly deviated, mucosa was without erythema, exudates or cobblestoning.  No thrush.  Swollen turbinates.   Neck: Supple without thyromegaly. No elevated JVP. Trachea was midline. No carotid bruits.    Respiratory: Symmetrical without dullness to percussion.  Breath sounds bilaterally were clear to auscultation. No wheezes, rhonchi or rales. No egophony.   Cardiovascular: Regular without murmur, clicks, gallops or rubs.  No heave.    Pulses:  Equal bilaterally.    Abdomen: Soft without organomegaly. No rebound, rigidity or guarding.  Surgical (+) scars  Lymphatic: No lymphadenopathy.  Musculoskeletal:No gross muscle weakness.    Extremities:  No lower extremity edema.  Muscle size, tone and strength are normal.    Sensory function appears intact.  Deep tendon reflexes are normal.   Skin:  Warm and dry.  Good color, turgor and pigmentation. No lesions. Varicoties. No rash.   Neurological/Psychiatric: General behavior, level of consciousness, thought content and emotional status is normal.  Cranial nerves II-XII are intact.      DATA:     Today Spirometry compared to previous demonstrates an FVC of 3.80 which is 102 % of predicted with an FEV1 of 2.89 liters which is 105 % of predicted.  FEV1/FVC ratio is 76%.  Maximum voluntary ventilation is 128 liters per minute or 99 % of predicted.  Flow volume loop shows no signs of intrathoracic or  extrathoracic obstruction. Impression. Normal.    Asthma Control Test [ACT] is a 5 part questionnaire, which has a point value system that ranges from 1- 5. This test is a validated questionnaire to help determine asthma control.  A total score of 19 or below indicates that the patients asthma may not be well controlled. If used in conjunction with PFTs, the correlation of determining asthma control is even stronger.  Today on testing the ACT was 23 out of max of 25. Impression: Poor control.     Epworth Sleepiness Scale is use this Tool to Measure Sleep Deprivation. The Epworth Sleepiness Scale was developed by researchers in [place] and is widely used by sleep professionals around the world to measure sleep deprivation.  How likely are you to doze off or fall asleep in the following situations, in contrast to feeling just tired? This refers to your usual way of life in recent times.  Results: Total Epworth Score: 3  Interpretation: No hypersomnolence.    IMPRESSION:      DEMBA NIGH a 78 y.o. male reactive airway disease, chronic cough from GERD S/p Nissen and severe nasal polyposis s/p surgery x 2. While we have never proven the systemic vasculitis or necrotizing vasculitis part and he has no renal, neuropathic or cardiovascular involvement; the finding of chronic sinusitis, eosinophilia, pANCA (+) and asthma aid in the diagnosis.  He has no neuropathy or neuropathic mononeuritis. His p-ANCA was 1:160 but fluctuates.  However, the clinical utility of ANCA in CSS raises a controversial issue of the sensitivity and specificity of ANCA in systemic vasculitis. In CSS P-ANCA is 50%.  He should never receive leukotriene modifier, allopurinol, propylthiouracil and quinine with this history of CSS for it will cause active flairs.  So we have been monitoring and he is without flair or recurrence = false + test.  With his chronic allergies, sinusitis with rhinitis and nasal  polyposis is stable after surgery. He  also has a failed Nissen fundoplication with chronic gastroparesis and GERD stable based on last. As for his sleep apnea he is doing very well with his CPAP @ 12 cm H20.  With this in mind, we would like to proceed with the following;                PLAN:   It was great to see Be[place] 7 Im glad his is doing well.  He remains active.  He still eating the same girl.  He was up visiting family and following up on some of his other doctors visits with seem to have gone well.  He still walking every morning.  He remains active.  His weight is stable.  Uses no inhalers.  His Epworth sleepiness score was normal.  Sleep apnea symptoms are excellent and we see no need to adjust therapy. We adjusted his new machine.  He will follow up in 6 months to a year for re-evaluation.  Vaccines were reviewed and current. We hope this updates you on my evaluation and clinical thinking. Thank you for allowing [place] to participate in  [facility] care.     Sincerely,    Elmer Ramp, D.O., MPH, [employer], FACP  Professor of Internal Medicine  Director, [facility]

## 2019-04-21 NOTE — Progress Notes (Signed)
ETCO2 = 23

## 2019-04-24 ENCOUNTER — Encounter: Payer: MEDICARE | Attending: Critical Care Medicine | Primary: Family Medicine

## 2019-08-30 NOTE — Telephone Encounter (Signed)
Called and spoke to pt and informed him that his apt time had been changed to 2:30.  Verified patients address is in [place] and told him apt card was going out in the mail

## 2019-10-27 ENCOUNTER — Ambulatory Visit
Admit: 2019-10-27 | Discharge: 2019-10-27 | Payer: MEDICARE | Attending: Critical Care Medicine | Primary: Family Medicine

## 2019-10-27 DIAGNOSIS — J45909 Unspecified asthma, uncomplicated: Secondary | ICD-10-CM

## 2019-10-27 LAB — SPIROMETRY WITHOUT BRONCHODILATOR
Expiratory Time: 6.1 s
FEF 25-75% %Pred-Pre: 101 L/sec
FEF 25-75% Pred: 1.95 L/sec
FEF 25-75-Pre: 1.97 L/s
FEV1 %Pred-Pre: 78 %
FEV1 Pred: 2.69 L
FEV1/FVC %Pred-Pre: 107 %
FEV1/FVC Pred: 75 %
FEV1/FVC: 80 %
FEV1: 2.12 L
FVC %Pred-Pre: 73 %
FVC Pred: 3.6 L
FVC: 2.64 L
PEF %Pred-Pre: 98 L/sec
PEF Pred: 6.82 L/sec
PEF-Pre: 6.74 L/sec

## 2019-10-27 LAB — NITRIC OXIDE: Parts Per Billion: 26

## 2019-10-27 NOTE — Progress Notes (Signed)
[facility]  Department of Pulmonary, Critical Care and Sleep Medicine  [facility]  Department of Internal Medicine  Office Note      Dear Julian Picket Wayne Sever, MD    We had the pleasure of seeing  Julian Castaneda in the [facility] regarding his chronic sinusitis, GERD S/p Nissen, reactive airway disease & obstructive sleep apnea.     HISTORY OF PRESENT ILLNESS:    Julian Castaneda is a 79 y.o. year old male who has a long standing history of allergies, elevated Ig E level around 600 requiring the administration of Xolair therapy. 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 Last level in 06/2010 was negative. The her recall He under when endoscopic surgery on 06/30/07 antrostomy bilateral with ethmoidectomies and left single sphenoidotomies and septoplasty. His treatment is working well, which included nasal rinses, Patanase, Astelin, and Xolair. I will  With his sleep apnea he is very compliant using the CPAP machine nightly for at least 6 hour. In 2019, he has a elevated PSA and a recent biopsy done showed prostate cancer Julian Castaneda score 6).  He is holding on treatment. He has long standing GERD requiring anti-reflux surgery years ago. His last EGD which was negative by Dr. Lavone Nian he has had no repeat complaints. His antireflux surgery anastomosis was stable and clear. In 2019, MRI was done and ok. He did have a ER visit in [place], [place] for what appears to be dehydration, he underwent a echocardiogram and stress test.     Since the last visit, Julian Castaneda is doing well. He has had no pulmonary complaint.  He has no new complaints. As far as his sleep apnea is concerned, he has been compliant with his CPAP 12 cmH20 and uses it  nightly. His ACT is 22/25. He has no CP, SOB or leg edema. No neurologic symptoms.  He went ahead and got his flu vaccination and for his sleep apnea his sleepiness score is normal.     ALLERGIES:    Allergies   Allergen Reactions   ??? Enalapril Swelling     Of lower lip   ??? Esomeprazole Hives and Swelling   ??? Nexium [Esomeprazole Magnesium Trihydrate] Swelling   ??? Nsaids Swelling   ??? Ranitidine Hives and Swelling   ??? Reglan [Metoclopramide Hcl] Other (See Comments)     States has bloating with this       PAST MEDICAL HISTORY:       Diagnosis Date   ??? Allergy    ??? Asthma     doing well per patient   ??? Chronic pansinusitis 10/03/2014   ??? COPD (chronic obstructive pulmonary disease) (HCC)    ??? CSS (Churg-Strauss syndrome) (HCC)     no current issues   ??? Elevated PSA     negative biospy   ??? Encounter for screening colonoscopy     and EGD 12-28-16    ??? GERD (gastroesophageal reflux disease)    ??? Hyperlipidemia    ??? Hypertension    ??? OSA on CPAP    ??? OSA treated with BiPAP 03/27/2014   ??? Sepsis (HCC)     06-2015 - resolved    ??? Sinusitis         MEDICATIONS:  Current Outpatient Medications   Medication Sig Dispense Refill   ??? vitamin D (CHOLECALCIFEROL) 1000 UNIT TABS tablet Take 1,000 Units by mouth daily LD 12-23-16     ??? finasteride (PROSCAR) 5 MG tablet Take 5 mg by mouth daily States he will not take until he is done with his daily antibiotics     ??? colestipol (COLESTID) 1 G tablet Take 1 g by mouth 2 times daily     ??? amLODIPine (NORVASC) 2.5 MG tablet Take 2.5 mg by mouth nightly     ??? hydrochlorothiazide (HYDRODIURIL) 25 MG tablet Take 25 mg by mouth daily.       ??? atorvastatin (LIPITOR) 80 MG tablet Take 80 mg by mouth daily.       No current facility-administered medications for this visit.       SOCIAL AND OCCUPATIONAL HEALTH:  He is married with four children. One son died from HF, daughter died from CAD all young. Wife has dementia (advanced). The patient is a never smoked  There is not history of TB or TB  exposure.  No Etoh Abuse.  There is not asbestos or silica dust exposure.  The patient reports no coal, foundry, quarry or [employer] exposure.  Travel history reveals no trips.  Denies history of recreational or IV drug use. Denies hot tub exposure. The patient has no pets. Hobbies include watching soap opera.  His wife passed away and we reflected on her death and discussed the issues with hospice and the cemetery.     Social History     Tobacco Use   ??? Smoking status: Never Smoker   ??? Smokeless tobacco: Never Used   Substance Use Topics   ??? Alcohol use: No     Alcohol/week: 0.0 standard drinks   ??? Drug use: No       SURGICAL HISTORY:   Past Surgical History:   Procedure Laterality Date   ??? COLONOSCOPY     ??? COLONOSCOPY N/A 12/28/2016    COLONOSCOPY WITH BIOPSY performed by [name], MD at [facility] ENDOSCOPY   ??? CYSTOSCOPY      prostate biopsy - 06/2015    ??? ENDOSCOPY, COLON, DIAGNOSTIC  01/22/2014   ??? GASTRIC FUNDOPLICATION  2005   ??? HERNIA REPAIR     ??? NASAL POLYP SURGERY     ??? PROSTATE BIOPSY  2011    negative pathology   ??? SEPTOPLASTY     ??? TUMOR REMOVAL  11/2010    fatty tumor removed Dr Lavone Nian   ??? UPPER GASTROINTESTINAL ENDOSCOPY  12/2009    with biopsy, pathology negative   ??? UPPER GASTROINTESTINAL ENDOSCOPY N/A 12/28/2016    EGD BIOPSY performed by [name], MD at [facility] ENDOSCOPY       FAMILY HISTORY:   Family History   Problem Relation Age of Onset   ??? High Blood Pressure Father    ??? High Cholesterol Father    ??? Asthma Sister    ??? Diabetes Sister        REVIEW OF SYSTEMS:   Constitutional: General health good . No weight changes.  No fevers, fatigue or weakness.   Head: Patient denies any history of trauma, convulsive disorder or syncope.    Skin:  Patient denies history of changes in pigmentation, eruptions or pruritus.  No easy bruising or bleeding.  EENT: Patient denies any history of color blindness, photophobia, diplopia, inflammation, cataracts or glaucoma.   Patient denies history of deafness, tinnitus,  pain, discharge or  recurrent infections.    Patient has a long history of rhinitis, chronic nasal discharge, drainage, & nasal polyps; all requiring surgery. Patient denies history of soreness of mouth or tongue.   Patient denies history of hoarseness, voice changes, sore throats or tonsillitis.    Lymphatic:  Patient denies history of enlargement, inflammation, pain or suppuration.    Cardiovascular:  Patient denies history of palpations, heart murmur, irregular rhythm, chest pain, exertional dyspnea, cyanosis, ascites, rheumatic fever, cold extremities or edema.  Respiration:  Patient denies wheezing, dyspnea, nocturnal dyspnea, cough, hemoptysis, pleurisy, TB or asthma.   Known OSA on PAP. 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   Gastrointestinal: Patient denies changes in appetite. No dysphagia, odynophagia or abdominal pain.  No hematochezia, melena, bowel habit changes or hemorrhoids.    No jaundice. GERD Gastic surgery in past with poor results.   Genitourinary:   Patient denies dysuria, frequency, urgency or incontinence.  Complains of difficulty in starting or stopping urinary stream.    Reports history of stones which is followed by [facility].  He has benign prostatic hypertrophy. (2017) prostate cancer. + Cancer Gleeson 6 + Biopsy.   Musculoskeletal: The patient denies history of arthritis or joint pains.  No loss of strength or dislocation.  Breasts:  No history of masses, lumps, pain, nipple changes or painful nipples.  Neurological: Patient denies vertigo, syncope.  No twitching or memory.   Psychological: Patient denies moodiness, depression or anxiety.  No obsessions or hallucinations.    Endocrine:  No history of goiter, exophthalmos or dryness of skin.  No polyuria or polyphagia.  No diabetes.   Hematopoietic:  No history of bleeding disorders or easy bruising.  Rheumatic:  No polyarthritis or inflammatory joint disease. History of positive P-ANCA    PHYSICAL EXAMINATION:  Vitals:    10/27/19  1235   BP: (!) 161/70   Pulse: 59   Resp: 12   Temp: 97.5 ??F (36.4 ??C)   SpO2: 97%   Weight: 194 lb (88 kg)   Height: 5' 7.5" (1.715 m)     Constitutional: A 79 y.o.  male  who is alert, oriented, cooperative and in no apparent distress.  Head was normocephalic and atraumatic.  Male patten baldness.   EENT: Eyes were equal and reactive to light and accommodation.  No discharge was appreciated.  Septum was mildly deviated, mucosa was without erythema, exudates or cobblestoning.  No thrush.  Swollen turbinates.   Neck: Supple without thyromegaly. No elevated JVP. Trachea was midline. No carotid bruits.    Respiratory: Symmetrical without dullness to percussion.  Breath sounds bilaterally were clear to auscultation. No wheezes, rhonchi or rales. No egophony.   Cardiovascular: Regular without murmur, clicks, gallops or rubs.  No heave.    Pulses:  Equal bilaterally.    Abdomen: Soft without organomegaly. No rebound, rigidity or guarding.  Surgical (+) scars  Lymphatic: No lymphadenopathy.  Musculoskeletal:No gross muscle weakness.    Extremities:  No lower extremity edema.  Muscle size, tone and strength are normal.    Sensory function appears intact.  Deep tendon reflexes are normal.   Skin:  Warm and dry.  Good color, turgor and pigmentation. No lesions. Varicoties. No rash.   Neurological/Psychiatric: General behavior, level of consciousness, thought content and emotional status is normal.  Cranial nerves II-XII are intact.      DATA:     Today Spirometry compared to previous demonstrates an FVC of 2.64 which is 73 % of predicted  with an FEV1 of 2.12 liters which is 78 % of predicted.  FEV1/FVC ratio is 80%.  Maximum voluntary ventilation is 128 liters per minute or 99 % of predicted.  Flow volume loop shows no signs of intrathoracic or extrathoracic obstruction. Impression. slight resticition    Asthma Control Test [ACT] is a 5 part questionnaire, which has a point value system that ranges from 1- 5. This test is a  validated questionnaire to help determine asthma control.  A total score of 19 or below indicates that the patients asthma may not be well controlled. If used in conjunction with PFTs, the correlation of determining asthma control is even stronger.  Today on testing the ACT was 22 out of max of 25. Impression: Poor control.     Epworth Sleepiness Scale is use this Tool to Measure Sleep Deprivation. The Epworth Sleepiness Scale was developed by researchers in [place] and is widely used by sleep professionals around the world to measure sleep deprivation.  How likely are you to doze off or fall asleep in the following situations, in contrast to feeling just tired? This refers to your usual way of life in recent times.  Results: Total Epworth Score: 3  Interpretation: No hypersomnolence.    IMPRESSION:      Julian Castaneda a 79 y.o. male reactive airway disease, chronic cough from GERD S/p Nissen and severe nasal polyposis s/p surgery x 2. While we have never proven the systemic vasculitis or necrotizing vasculitis part and he has no renal, neuropathic or cardiovascular involvement; the finding of chronic sinusitis, eosinophilia, pANCA (+) and asthma aid in the diagnosis.  He has no neuropathy or neuropathic mononeuritis. His p-ANCA was 1:160 but fluctuates.  However, the clinical utility of ANCA in CSS raises a controversial issue of the sensitivity and specificity of ANCA in systemic vasculitis. In CSS P-ANCA is 50%.  He should never receive leukotriene modifier, allopurinol, propylthiouracil and quinine with this history of CSS for it will cause active flairs.  So we have been monitoring and he is without flair or recurrence = false + test.  With his chronic allergies, sinusitis with rhinitis and nasal  polyposis is stable after surgery. He also has a failed Nissen fundoplication with chronic gastroparesis and GERD stable based on last. As for his sleep apnea he is doing very well with his CPAP @ 12 cm H20.   With this in mind, we would like to proceed with the following;                PLAN:  Nothing new to add but continue to monitor. Im glad he remains active. He was up visiting family and following up on some of his other doctors visits with seem to have gone well.  He still walking every morning.  He remains active. Uses no inhalers.  His Epworth sleepiness score was normal.  Sleep apnea symptoms are excellent and we see no need to adjust therapy. We adjusted his new machine.  He will follow up in 6 months to a year for re-evaluation.  Vaccines were reviewed and current. We hope this updates you on my evaluation and clinical thinking. Thank you for allowing [place] to participate in Julian Castaneda care.     Sincerely,    Elmer Ramp, D.O., MPH, [employer], FACP  Professor of Internal Medicine  Director, [facility]

## 2020-03-27 ENCOUNTER — Ambulatory Visit: Admit: 2020-03-27 | Discharge: 2020-03-27 | Payer: MEDICARE | Attending: Surgery | Primary: Family Medicine

## 2020-03-27 DIAGNOSIS — K219 Gastro-esophageal reflux disease without esophagitis: Secondary | ICD-10-CM

## 2020-03-27 NOTE — Telephone Encounter (Addendum)
Prior Authorization Form:      DEMOGRAPHICS:                     Patient Name:  Julian Castaneda  Patient DOB:  1940/08/12            Insurance:  Payor: MEDICARE / Plan: MEDICARE PART A AND B / Product Type: *No Product type* /   Insurance ID Number:    Payor/Plan Subscr DOB Sex Relation Sub. Ins. ID Effective Group Num   1. PROGRESSIVE -* Lirette,Minor* 02/06/1941 Male Self [id number] 05/27/17                                     [address]   2. MEDICARE - [place]* Kaczmarek,Karnell* 15-Mar-1941 Male Self [id number] 06/15/13                                    [address] [address]         DIAGNOSIS & PROCEDURE:                       Procedure/Operation:   EGD  COLONOSCOPY          CPT Code:   26834   19622    Diagnosis:    GERD    H/O COLON POLYPS    ICD10 Code: K21.9   Z86.010    Location:  [facility]    Surgeon:  Yaakov Guthrie INFORMATION:                          Date:   04-24-2020   Time: 8:00             Anesthesia:  MAC/TIVA                                                       Status:  Outpatient        Special Comments:     RESCHEDULED    Electronically signed by Mellody Dance, MA on 03/27/2020 at 11:46 AM

## 2020-04-10 NOTE — Progress Notes (Signed)
Pt had covid vaccination. To email card

## 2020-04-22 NOTE — Progress Notes (Signed)
[facility] PRE-ADMISSION TESTING INSTRUCTIONS    The Preadmission Testing patient is instructed accordingly using the following criteria (check applicable):    ARRIVAL INSTRUCTIONS:  [x] Parking the day of Surgery is located in the Main Entrance lot.  Upon entering the door, make an immediate right to the surgery reception desk    [x] Bring photo ID and insurance card    [] Bring in a copy of Living will or Durable Power of attorney papers.    [x] Please be sure to arrange for responsible adult to provide transportation to and from the hospital    [x] Please arrange for responsible adult to be with you for the 24 hour period post procedure due to having anesthesia      GENERAL INSTRUCTIONS:    [x] Nothing by mouth after midnight, including gum, candy, mints or water    [x] You may brush your teeth, but do not swallow any water    [x] Take medications as instructed with 1-2 oz of water    [x] Stop herbal supplements and vitamins 5 days prior to procedure    [] Follow preop dosing of blood thinners per physician instructions    [] Take 1/2 dose of evening insulin, but no insulin after midnight    [] No oral diabetic medications after midnight    [] If diabetic and have low blood sugar or feel symptomatic, take 1-2oz apple juice only    [] Bring inhalers day of surgery    [] Bring C-PAP/ Bi-Pap day of surgery    [] Bring urine specimen day of surgery    [x] Shower or bath with soap, lather and rinse well, AM of Surgery, no lotion, powders or creams to surgical site    [x] Follow bowel prep as instructed per surgeon    [x] No tobacco products within 24 hours of surgery     [x] No alcohol or illegal drug use within 24 hours of surgery.    [x] Jewelry, body piercing's, eyeglasses, contact lenses and dentures are not permitted into surgery (bring cases)      [x] Please do not wear any nail polish, make up or hair products on the day of surgery    [x] You can expect a call the business day prior  to procedure to notify you if your arrival time changes    [x] If you receive a survey after surgery we would greatly appreciate your comments    [] Parent/guardian of a minor must accompany their child and remain on the premises  the entire time they are under our care     [] Pediatric patients may bring favorite toy, blanket or comfort item with them    [] A caregiver or family member must remain with the patient during their stay if they are mentally handicapped, have dementia, disoriented or unable to use a call light or would be a safety concern if left unattended    [x] Please notify surgeon if you develop any illness between now and time of surgery (cold, cough, sore throat, fever, nausea, vomiting) or any signs of infections  including skin, wounds, and dental.    [x]  The Outpatient Pharmacy is available to fill your prescription here on your day of surgery, ask your preop nurse for details    [] Other instructions    EDUCATIONAL MATERIALS PROVIDED:    [] PAT Preoperative Education Packet/Booklet     [] Medication List    []   Transfusion bracelet applied with instructions    [] Shower with soap, lather and rinse well, and use CHG wipes provided the evening before surgery as instructed    [] Incentive spirometer with instructions

## 2020-04-22 NOTE — Progress Notes (Signed)
Have you been tested for COVID  No  Vaccinated         Have you been told you were positive for COVID No  Have you had any known exposure to someone that is positive for COVID No  Do you have a cough                   No              Do you have shortness of breath No                 Do you have a sore throat            No                Are you having chills                    No                Are you having muscle aches.     No                    Please come to the hospital wearing a mask and have your significant other wear a mask as well.  Both of you should check your temperature before leaving to come here,  if it is 100 or higher please call [phone] for instruction.

## 2020-04-24 ENCOUNTER — Encounter: Attending: Surgery | Primary: Family Medicine

## 2020-04-24 ENCOUNTER — Inpatient Hospital Stay: Payer: MEDICARE

## 2020-04-24 MED ORDER — SODIUM CHLORIDE 0.9 % IV SOLN
0.9 % | INTRAVENOUS | Status: DC
Start: 2020-04-24 — End: 2020-04-24

## 2020-04-24 MED ORDER — NORMAL SALINE FLUSH 0.9 % IV SOLN
0.9 | INTRAVENOUS | Status: DC | PRN
Start: 2020-04-24 — End: 2020-04-24

## 2020-04-24 MED ORDER — OMEPRAZOLE 40 MG PO CPDR
40 MG | ORAL_CAPSULE | Freq: Every day | ORAL | 1 refills | Status: DC
Start: 2020-04-24 — End: 2020-12-09

## 2020-04-24 MED ORDER — PROPOFOL 200 MG/20ML IV EMUL
200 | INTRAVENOUS | Status: AC
Start: 2020-04-24 — End: 2020-04-24

## 2020-04-24 MED ORDER — NORMAL SALINE FLUSH 0.9 % IV SOLN
0.9 | Freq: Two times a day (BID) | INTRAVENOUS | Status: DC
Start: 2020-04-24 — End: 2020-04-24

## 2020-04-24 MED ORDER — PROPOFOL 200 MG/20ML IV EMUL
200 MG/20ML | INTRAVENOUS | Status: DC | PRN
Start: 2020-04-24 — End: 2020-04-24
  Administered 2020-04-24: 13:00:00 450 via INTRAVENOUS

## 2020-04-24 MED ORDER — SODIUM CHLORIDE 0.9 % IV SOLN
0.9 % | INTRAVENOUS | Status: DC | PRN
Start: 2020-04-24 — End: 2020-04-24
  Administered 2020-04-24: 13:00:00 via INTRAVENOUS

## 2020-04-24 MED ORDER — SODIUM CHLORIDE 0.9 % IV SOLN
0.9 | INTRAVENOUS | Status: DC | PRN
Start: 2020-04-24 — End: 2020-04-24

## 2020-04-24 MED FILL — DIPRIVAN 200 MG/20ML IV EMUL: 200 MG/20ML | INTRAVENOUS | Qty: 40

## 2020-04-24 MED FILL — DIPRIVAN 200 MG/20ML IV EMUL: 200 MG/20ML | INTRAVENOUS | Qty: 20

## 2020-04-24 NOTE — Progress Notes (Signed)
[id number] Dr Lavone Nian called for pre op orders will put them in when he gets here

## 2020-04-24 NOTE — H&P (Signed)
[name], MD   Physician   Specialty:  General Surgery   Progress Notes ??    Signed   Encounter Date:  03/27/2020                 Signed                 ??  Patient's Name/Date of Birth: Julian Castaneda / 03/21/1941  ??  Date: 03/27/2020  ??  PCP: [name], MD  ??       Chief Complaint   Patient presents with   ??? Colonoscopy   ?? ?? 3 yr recall    ??  ??  HPI:  Patient seen for evaluation of 2 problems:  1. Recurrent diarrhea, Hx of colon polyps  2. Heart burn for a long time. Previous Hx of GERD  ??  Patient's medications, allergies, past medical, surgical, social and family histories were reviewed and updated as appropriate.  ??        Allergies   Allergen Reactions   ??? Enalapril Swelling   ?? ?? Of lower lip   ??? Esomeprazole Hives and Swelling   ??? Nexium [Esomeprazole Magnesium Trihydrate] Swelling   ??? Nsaids Swelling   ??? Ranitidine Hives and Swelling   ??? Reglan [Metoclopramide Hcl] Other (See Comments)   ?? ?? States has bloating with this   ??  ??       Past Medical History:   Diagnosis Date   ??? Allergy ??   ??? Asthma ??   ?? doing well per patient   ??? Chronic pansinusitis 10/03/2014   ??? COPD (chronic obstructive pulmonary disease) (HCC) ??   ??? CSS (Churg-Strauss syndrome) (HCC) ??   ?? no current issues   ??? Elevated PSA ??   ?? negative biospy   ??? Encounter for screening colonoscopy ??   ?? and EGD 12-28-16    ??? GERD (gastroesophageal reflux disease) ??   ??? Hyperlipidemia ??   ??? Hypertension ??   ??? OSA on CPAP ??   ??? OSA treated with BiPAP 03/27/2014   ??? Sepsis (HCC) ??   ?? 06-2015 - resolved    ??? Sinusitis ??      ??        Past Surgical History:   Procedure Laterality Date   ??? COLONOSCOPY ?? ??   ??? COLONOSCOPY N/A 12/28/2016   ?? COLONOSCOPY WITH BIOPSY performed by [name], MD at [facility] ENDOSCOPY   ??? CYSTOSCOPY ?? ??   ?? prostate biopsy - 06/2015    ??? ENDOSCOPY, COLON, DIAGNOSTIC ?? 01/22/2014   ??? GASTRIC FUNDOPLICATION ?? 2005   ??? HERNIA REPAIR ?? ??   ??? NASAL POLYP SURGERY ?? ??   ??? PROSTATE BIOPSY ?? 2011   ?? negative pathology   ??? SEPTOPLASTY  ?? ??   ??? TUMOR REMOVAL ?? 11/2010   ?? fatty tumor removed Dr Lavone Nian   ??? UPPER GASTROINTESTINAL ENDOSCOPY ?? 12/2009   ?? with biopsy, pathology negative   ??? UPPER GASTROINTESTINAL ENDOSCOPY N/A 12/28/2016   ?? EGD BIOPSY performed by [name], MD at [facility] ENDOSCOPY      ??  Social History   ??        Tobacco Use   ??? Smoking status: Never Smoker   ??? Smokeless tobacco: Never Used   Substance Use Topics   ??? Alcohol use: No   ?? ?? Alcohol/week: 0.0 standard drinks   ??  ??  Current Outpatient Medications   Medication Sig Dispense Refill   ??? loratadine (CLARITIN) 10 MG tablet Take 10 mg by mouth daily ?? ??   ??? losartan (COZAAR) 25 MG tablet ?? ?? ??   ??? montelukast (SINGULAIR) 10 MG tablet Take 10 mg by mouth nightly ?? ??   ??? vitamin D (CHOLECALCIFEROL) 1000 UNIT TABS tablet Take 1,000 Units by mouth daily LD 12-23-16 ?? ??   ??? finasteride (PROSCAR) 5 MG tablet Take 5 mg by mouth daily States he will not take until he is done with his daily antibiotics ?? ??   ??? amLODIPine (NORVASC) 5 MG tablet Take 5 mg by mouth nightly  ?? ??   ??? atorvastatin (LIPITOR) 80 MG tablet Take 80 mg by mouth daily. ?? ??   ??  No current facility-administered medications for this visit.   ??  ??  Review of Systems  Constitutional: negative  Eyes: negative  Ears, nose, mouth, throat, and face: negative  Respiratory: negative  Cardiovascular: negative  Gastrointestinal: negative  Genitourinary:negative  Integument/breast: negative  Hematologic/lymphatic: negative  Musculoskeletal:negative  Neurological: negative  Allergic/Immunologic: negative  ??  Physical exam:  BP (!) 176/64    Pulse 73    Temp 97.3 ??F (36.3 ??C)    Resp 16    Ht 5' 9.5" (1.765 m)    Wt 201 lb (91.2 kg)    BMI 29.26 kg/m??   General appearance: no acute distress  Head:NCAT, EOMI, PERRLA, conjunctiva pink  Neck: no masses, supple  Lungs: CTABL  Heart: RRR  Abdomen: soft, nondistended, nontender, no guarding, no peritoneal signs, normoactive bowel sounds  Extremities:no edema  Neuro exam: normal  Skin:  no lesions, no rashes  ??  Assessment/Plan:  .proceed with panendoscopy  ??  The procedure risks, benfits, possible complications and alternative options where explained to the patient, he understands and agrees to proceed with surgery.  ??  No follow-ups on file.  ??  [name], MD  ??  ??  Send copy of H&P to PCP, [name], MD  ??  ??  ??               Office Visit on 03/27/2020           Office Visit on 03/27/2020                Detailed Report            Note shared with patient        Progress Notes Info    Author Note Status Last Update User Last Update Date/Time   [name], MD Signed [name], MD 03/27/2020 11:35 AM     Chart Review Routing History    No routing history on file.    Patient's History and Physical was reviewed.    Patient examined.    There has been no change.

## 2020-04-24 NOTE — Anesthesia Post-Procedure Evaluation (Signed)
Department of Anesthesiology  Postprocedure Note    Patient: Julian Castaneda  MRN: [id number]  Stewart: 08-07-1940  Date of evaluation: 04/24/2020  Time:  11:05 AM     Procedure Summary     Date: 04/24/20 Room / Location: [facility] 02 / [facility]    Anesthesia Start: [id number] Anesthesia Stop: [phone]    Procedures:       EGD BIOPSY (N/A )      COLONOSCOPY POLYPECTOMY SNARE/COLD BIOPSY (N/A ) Diagnosis: (GERD, HISTORY OF COLON POLYPS)    Surgeons: Gearldine Bienenstock, MD Responsible Provider: Idell Pickles, MD    Anesthesia Type: MAC ASA Status: 3          Anesthesia Type: MAC    Aldrete Phase I: Aldrete Score: 10    Aldrete Phase II: Aldrete Score: 10    Last vitals: Reviewed and per EMR flowsheets.       Anesthesia Post Evaluation    Patient location during evaluation: PACU  Patient participation: complete - patient participated  Level of consciousness: awake and alert  Airway patency: patent  Nausea & Vomiting: no vomiting and no nausea  Complications: no  Cardiovascular status: hemodynamically stable  Respiratory status: acceptable  Hydration status: stable  Multimodal analgesia pain management approach

## 2020-04-24 NOTE — Discharge Instructions (Signed)
[facility] AMBULATORY PROCEDURE DISCHARGE INSTRUCTIONS  You may be drowsy or lightheaded after receiving sedation or anesthesia.    A responsible person should be with you for the next 24 hours.    Please follow the instructions checked below:    DIET INSTRUCTIONS:  [x]Start with light diet and progress to your normal diet as you feel like eating. If you experience nausea or repeated episodes of vomiting which persist beyond 12-24 hours, notify your doctor.  []Other     ACTIVITY INSTRUCTIONS:  [x]Rest today. Increase activity as tolerated    []No heavy lifting or strenuous activity     [x]No driving for today  []Other      MEDICATION INSTRUCTIONS:    [x]Prescriptions sent with you.  Use as directed.  When taking pain medications, you may experience dizziness or drowsiness.  Do not drink alcohol or drive when taking these medications.  [x]Continue preop medications                               Post-procedure Care   If any tissue was removed:   It will be sent to a lab to be examined. It may take 1-2 weeks for results. The doctor will usually give an initial report after the scope is removed. Other tests may be recommended.   A small amount of bleeding may occur during the first few days after the procedure.     When you return home after the procedure, be sure to follow your doctor's instructions, which may include:   Resume medicines as instructed by your doctor.   Resume normal diet, unless directed otherwise by your doctor.   The sedative will make you drowsy. Avoid driving, operating machinery, or making important decisions for the rest of the day.   Rest for the remainder of the day.     After arriving home, contact your doctor if any of the following occurs:   Bleeding from your rectum, notify your doctor if you pass a teaspoonful of blood or more.   Black, tarry stools   Severe abdominal pain   Hard, swollen abdomen   Signs of infection, including fever or chills   Inability to pass gas or  stool   Coughing, shortness of breath, chest pain, severe nausea or vomiting     In case of an emergency, CALL 911 .        FOLLOW-UP CARE:  [x]Call the office at [phone] for follow-up appointment in 1 week

## 2020-04-24 NOTE — Brief Op Note (Signed)
Brief Postoperative Note      Patient: Julian Castaneda  Date of Birth: [birth year]  MRN: [id number]    Date of Procedure: 04/24/2020    Pre-Op Diagnosis: GERD, HISTORY OF COLON POLYPS    Post-Op Diagnosis: Same       Procedure(s):  EGD BIOPSY  COLONOSCOPY POLYPECTOMY SNARE/COLD BIOPSY    Surgeon(s):  [name], MD    Assistant:  * No surgical staff found *    Anesthesia: Monitor Anesthesia Care    Estimated Blood Loss (mL): Minimal    Complications: None    Specimens:   ID Type Source Tests Collected by Time Destination   A : ANTRAL BX Tissue Stomach SURGICAL PATHOLOGY [name], MD 04/24/2020 [phone]    B : POLYPECTOMY CECUM Tissue Colon SURGICAL PATHOLOGY [name], MD 04/24/2020 [phone]        Implants:  * No implants in log *      Drains: * No LDAs found *    Findings:     Electronically signed by [name], MD on 04/24/2020 at 8:55 AM

## 2020-04-24 NOTE — Progress Notes (Signed)
0915 discharge instructions given to pt and hi on and they verbalized understanding of instructions   0940 Dr Lavone Nian here and spoke to pt and his son  11 discharged into the care of his son

## 2020-04-24 NOTE — Anesthesia Pre-Procedure Evaluation (Signed)
Department of Anesthesiology  Preprocedure Note       Name:  Julian Castaneda   Age:  79 y.o.  DOB:  [birth year]                                          MRN:  [id number]         Date:  04/24/2020      Surgeon: Juliann Mule):  Gearldine Bienenstock, MD    Procedure: Procedure(s):  EGD ESOPHAGOGASTRODUODENOSCOPY  COLONOSCOPY DIAGNOSTIC    Medications prior to admission:   Prior to Admission medications    Medication Sig Start Date End Date Taking? Authorizing Provider   fluticasone (FLONASE) 50 MCG/ACT nasal spray fluticasone propionate 50 mcg/actuation nasal spray,suspension    Historical Provider, MD   losartan (COZAAR) 25 MG tablet Take 25 mg by mouth daily  01/27/20   Historical Provider, MD   vitamin D (CHOLECALCIFEROL) 1000 UNIT TABS tablet Take 1,000 Units by mouth daily LD 12-23-16    Historical Provider, MD   finasteride (PROSCAR) 5 MG tablet Take 5 mg by mouth daily States he will not take until he is done with his daily antibiotics    Historical Provider, MD   amLODIPine (NORVASC) 5 MG tablet Take 5 mg by mouth 2 times daily     Historical Provider, MD   atorvastatin (LIPITOR) 80 MG tablet Take 80 mg by mouth daily.    Historical Provider, MD       Current medications:    No current outpatient medications on file.     No current facility-administered medications for this visit.       Allergies:    Allergies   Allergen Reactions   ??? Enalapril Swelling     Of lower lip   ??? Esomeprazole Hives and Swelling   ??? Nexium [Esomeprazole Magnesium Trihydrate] Swelling   ??? Nsaids Swelling   ??? Ranitidine Hives and Swelling   ??? Reglan [Metoclopramide Hcl] Other (See Comments)     States has bloating with this       Problem List:    Patient Active Problem List   Diagnosis Code   ??? Chronic sinusitis J32.9   ??? 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 Asthma J45.909   ??? Elevated PSA R97.20   ??? Environmental allergies Z91.09   ??? OSA treated with BiPAP G47.33   ??? 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 SIRS (systemic inflammatory response  syndrome) (HCC) R65.10   ??? BPH (benign prostatic hyperplasia) N40.0   ??? Complicated UTI (urinary tract infection) N39.0   ??? Gastroesophageal reflux disease K21.9       Past Medical History:        Diagnosis Date   ??? Asthma     doing well per patient   ??? Chronic pansinusitis 10/03/2014   ??? COPD (chronic obstructive pulmonary disease) ([facility])    ??? CSS (Churg-Strauss syndrome) (HCC)     no current issues   ??? Elevated PSA     negative biospy   ??? Encounter for screening colonoscopy     and EGD 12-28-16    ??? GERD (gastroesophageal reflux disease)    ??? Hyperlipidemia    ??? Hypertension    ??? OSA on CPAP    ??? OSA treated with BiPAP 03/27/2014   ??? Sepsis ([place])     06-2015 - resolved  Past Surgical History:        Procedure Laterality Date   ??? COLONOSCOPY     ??? COLONOSCOPY N/A 12/28/2016    COLONOSCOPY WITH BIOPSY performed by Gearldine Bienenstock, MD at [facility]   ??? CYSTOSCOPY      prostate biopsy - 06/2015    ??? ENDOSCOPY, COLON, DIAGNOSTIC  01/22/2014   ??? GASTRIC FUNDOPLICATION  1245   ??? HERNIA REPAIR     ??? NASAL POLYP SURGERY     ??? PROSTATE BIOPSY  2011    negative pathology   ??? SEPTOPLASTY     ??? TUMOR REMOVAL  11/2010    fatty tumor removed Dr Freddrick March   ??? UPPER GASTROINTESTINAL ENDOSCOPY  12/2009    with biopsy, pathology negative   ??? UPPER GASTROINTESTINAL ENDOSCOPY N/A 12/28/2016    EGD BIOPSY performed by Gearldine Bienenstock, MD at [facility] History:    Social History     Tobacco Use   ??? Smoking status: Never Smoker   ??? Smokeless tobacco: Never Used   Substance Use Topics   ??? Alcohol use: No     Alcohol/week: 0.0 standard drinks                                Counseling given: Not Answered      Vital Signs (Current):   There were no vitals filed for this visit.                                           BP Readings from Last 3 Encounters:   04/24/20 (!) 169/79   03/27/20 (!) 176/64   10/27/19 (!) 161/70       NPO Status:                                                                                 BMI:   Wt Readings from  Last 3 Encounters:   04/24/20 195 lb (88.5 kg)   03/27/20 201 lb (91.2 kg)   10/27/19 194 lb (88 kg)     There is no height or weight on file to calculate BMI.    CBC:   Lab Results   Component Value Date    WBC 4.6 07/15/2015    RBC 4.51 07/15/2015    HGB 12.7 07/15/2015    HCT 38.5 07/15/2015    MCV 85.4 07/15/2015    RDW 13.7 07/15/2015    PLT 187 07/15/2015       CMP:   Lab Results   Component Value Date    NA 139 07/15/2015    K 3.6 07/15/2015    CL 101 07/15/2015    CO2 25 07/15/2015    BUN 21 07/15/2015    CREATININE 0.8 07/15/2015    GFRAA >60 07/15/2015    LABGLOM >60 07/15/2015    GLUCOSE 98 07/15/2015    GLUCOSE 86 06/21/2009    PROT 7.0 07/15/2015    CALCIUM 9.2 07/15/2015  BILITOT 1.1 07/15/2015    ALKPHOS 83 07/15/2015    AST 23 07/15/2015    ALT 21 07/15/2015       POC Tests: No results for input(s): POCGLU, POCNA, POCK, POCCL, POCBUN, POCHEMO, POCHCT in the last 72 hours.    Coags:   Lab Results   Component Value Date    PROTIME 10.4 07/14/2014    INR 1.0 07/14/2014    APTT 29.1 07/14/2014       HCG (If Applicable): No results found for: PREGTESTUR, PREGSERUM, HCG, HCGQUANT     ABGs: No results found for: PHART, PO2ART, PCO2ART, HCO3ART, BEART, O2SATART     Type & Screen (If Applicable):  No results found for: LABABO, [place]    Anesthesia Evaluation  Patient summary reviewed no history of anesthetic complications:   Airway: Mallampati: II  TM distance: >3 FB   Neck ROM: full  Mouth opening: > = 3 FB Dental: normal exam         Pulmonary: breath sounds clear to auscultation  (+) COPD:  sleep apnea (BiPap):  asthma:                            Cardiovascular:  Exercise tolerance: good (>4 METS),   (+) hypertension: moderate,       ECG reviewed  Rhythm: regular  Rate: normal                    Neuro/Psych:   Negative Neuro/Psych ROS              GI/Hepatic/Renal:   (+) GERD:, bowel prep,           Endo/Other:    (+) malignancy/cancer (PROSTATE).                 Abdominal:         (-) obese        Vascular: negative vascular ROS.         Other Findings:               Anesthesia Plan      MAC     ASA 3       Induction: intravenous.    MIPS: Prophylactic antiemetics administered.  Anesthetic plan and risks discussed with patient and child/children.      Plan discussed with CRNA.                  Idell Pickles, MD   04/24/2020

## 2020-04-24 NOTE — Progress Notes (Signed)
Abdomen semi firm with hypoactive bowel sounds.

## 2020-04-24 NOTE — Op Note (Signed)
[facility]                  [address] [place], [place] [address]                                OPERATIVE REPORT    PATIENT NAME: Julian Castaneda, Julian Castaneda                 DOB:        Nov 05, 1940  MED REC NO:   [id number]                            ROOM:  ACCOUNT NO:   [id number]                           ADMIT DATE: 04/24/2020  PROVIDER:     [name], MD    DATE OF PROCEDURE:  04/24/2020    PREOPERATIVE DIAGNOSES:  1.  Gastroesophageal reflux disease.  2.  History of colon polyps.    POSTOPERATIVE DIAGNOSES:  1.  Gastric ulcer, antral.  2.  Gastroesophageal reflux disease.  3.  Polyp, cecum.  4.  Diverticulosis coli.    PROCEDURES PERFORMED:  1.  Esophagogastroduodenoscopy with gastric biopsies.  2.  Total colonoscopy with snare polypectomy.    SURGEON:  [name], M.D.    ESTIMATED BLOOD LOSS:  Minimal.    DESCRIPTION OF PROCEDURE:  With the patient in the left lateral position  on the operating table, after adequate sedation was administered by  Anesthesia, a standard Olympus gastroscope was introduced through the  mouth and advanced into the esophagus.  Free gastroesophageal reflux was  noted.  There was no esophageal ulcers, no stricture, and no Barrett's.   GE junction was normal.  Stomach revealed normal gastric folds.  The  stomach was visualized by retroflexing the end of the scope and was  normal.  Body of the stomach was unremarkable as well.  Antrum revealed  a 1-cm prepyloric gastric ulcer along with associated antral gastritis.   Photos and biopsies were taken.  Pyloric opening was normal.   Visualization of the first, second, third, and fourth parts of duodenum  showed no evidence of ulcers, bleeding, or other pathology.  The scope  was slowly withdrawn and totally removed.  The patient tolerated the  procedure well.    Digital rectal examination and dilatation was performed, showed evidence  of good sphincter tone.  There were no palpable masses and no  blood on  the examining finger.  A standard Olympus colonoscope was introduced  through the anal opening and advanced into the rectosigmoid.  The anus  and rectum were normal.  Sigmoid colon showed evidence of diverticulosis  without evidence of diverticulitis.  Remainder of the left colon  visualized was normal.  Transverse colon was visualized in its entirety  and was normal.  Ascending colon and cecum were reached and visualized.   Just above the ileocecal valve, a 1-cm sessile polyp was noted, was  removed with a snare and retrieved.  The scope was slowly withdrawn.   Further observation of the colon the way out revealed no other  pathology.  The scope was totally removed.  The patient tolerated the  procedure well.    RECOMMENDATIONS:  1.  High-fiber diet.  2.  Repeat colonoscopy  in five years or earlier if indicated.        [name], MD    D: 04/24/2020 8:59:37       T: 04/24/2020 9:02:26     [email or web address]  Job#: [id number]     Doc#: [id number]    CC:

## 2020-04-26 ENCOUNTER — Ambulatory Visit
Admit: 2020-04-26 | Discharge: 2020-04-26 | Payer: MEDICARE | Attending: Critical Care Medicine | Primary: Family Medicine

## 2020-04-26 DIAGNOSIS — J45909 Unspecified asthma, uncomplicated: Secondary | ICD-10-CM

## 2020-04-26 LAB — SPIROMETRY WITHOUT BRONCHODILATOR
FEV1 %Pred-Pre: 72 %
FEV1 Pred: 2.81 L
FEV1/FVC %Pred-Pre: 100 %
FEV1/FVC Pred: 75 %
FEV1/FVC: 76 %
FEV1: 2.04 L
FVC %Pred-Pre: 71 %
FVC Pred: 3.78 L
FVC: 2.69 L

## 2020-04-26 LAB — POCT NITRIC OXIDE: FeNO: 24 [ppb]

## 2020-04-26 NOTE — Progress Notes (Signed)
Pt to follow up in 6 months.

## 2020-04-26 NOTE — Progress Notes (Signed)
[facility]  Department of Pulmonary, Critical Care and Sleep Medicine  [facility]  Department of Internal Medicine  Office Note      Dear [name], MD    We had the pleasure of seeing  Julian Castaneda in the [facility] regarding his chronic sinusitis, GERD S/p Nissen, reactive airway disease & obstructive sleep apnea.     HISTORY OF PRESENT ILLNESS:    Julian Castaneda is a 79 y.o. year old male who has a long standing history of allergies, elevated Ig E level around 600 requiring the administration of Xolair therapy. 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 Last level in 06/2010 was negative. The her recall He under when endoscopic surgery on 06/30/07 antrostomy bilateral with ethmoidectomies and left single sphenoidotomies and septoplasty. His treatment is working well, which included nasal rinses, Patanase, Astelin, and Xolair. I will  With his sleep apnea he is very compliant using the CPAP machine nightly for at least 6 hour. In 2019, he has a elevated PSA and a recent biopsy done showed prostate cancer [name] score 6).  He is holding on treatment. He has long standing GERD requiring anti-reflux surgery years ago. His last EGD which was negative by Dr. Lavone Nian he has had no repeat complaints. His antireflux surgery anastomosis was stable and clear. In 2019, MRI was done and ok. He did have a ER visit in [place], [place] for what appears to be dehydration, he underwent a echocardiogram and stress test.     Since the last visit, Julian Castaneda is doing well. He has had no pulmonary complaint.  He has no new complaints. As far as his sleep apnea is concerned, he has been compliant with his CPAP 12 cm H20 and uses it  nightly. His ACT is 22/25. He has no CP, SOB or leg edema. No neurologic symptoms.  He went ahead and got his flu vaccination and for his sleep apnea his sleepiness score is normal.  He is feeling well but is concerned about is BP with high systolic's and low diastolic. He is on BP medication.     ALLERGIES:    Allergies   Allergen Reactions   ??? Enalapril Swelling     Of lower lip   ??? Esomeprazole Hives and Swelling   ??? Nexium [Esomeprazole Magnesium Trihydrate] Swelling   ??? Nsaids Swelling   ??? Ranitidine Hives and Swelling   ??? Reglan [Metoclopramide Hcl] Other (See Comments)     States has bloating with this       PAST MEDICAL HISTORY:       Diagnosis Date   ??? Asthma     doing well per patient   ??? Chronic pansinusitis 10/03/2014   ??? COPD (chronic obstructive pulmonary disease) (HCC)    ??? CSS (Churg-Strauss syndrome) (HCC)     no current issues   ??? Elevated PSA     negative biospy   ??? Encounter for screening colonoscopy     and EGD 12-28-16    ??? GERD (gastroesophageal reflux disease)    ??? Hyperlipidemia    ??? Hypertension    ??? OSA on CPAP    ??? OSA treated with BiPAP 03/27/2014   ??? Sepsis (HCC)  06-2015 - resolved         MEDICATIONS:   Current Outpatient Medications   Medication Sig Dispense Refill   ??? omeprazole (PRILOSEC) 40 MG delayed release capsule Take 1 capsule by mouth every morning (before breakfast) 90 capsule 1   ??? fluticasone (FLONASE) 50 MCG/ACT nasal spray fluticasone propionate 50 mcg/actuation nasal spray,suspension     ??? losartan (COZAAR) 25 MG tablet Take 25 mg by mouth daily      ??? vitamin D (CHOLECALCIFEROL) 1000 UNIT TABS tablet Take 1,000 Units by mouth daily LD 12-23-16     ??? finasteride (PROSCAR) 5 MG tablet Take 5 mg by mouth daily States he will not take until he is done with his daily antibiotics     ??? amLODIPine (NORVASC) 5 MG tablet Take 5 mg by mouth 2 times daily      ??? atorvastatin (LIPITOR) 80 MG tablet Take 80 mg by mouth daily.       No current facility-administered medications for  this visit.       SOCIAL AND OCCUPATIONAL HEALTH:  He is married with four children. One son died from HF, daughter died from CAD all young. Wife has dementia (advanced). The patient is a never smoked  There is not history of TB or TB exposure.  No Etoh Abuse.  There is not asbestos or silica dust exposure.  The patient reports no coal, foundry, quarry or [employer] exposure.  Travel history reveals no trips.  Denies history of recreational or IV drug use. Denies hot tub exposure. The patient has no pets. Hobbies include watching soap opera.  His wife passed away and we reflected on her death and discussed the issues with hospice and the cemetery.     Social History     Tobacco Use   ??? Smoking status: Never Smoker   ??? Smokeless tobacco: Never Used   Vaping Use   ??? Vaping Use: Never used   Substance Use Topics   ??? Alcohol use: No     Alcohol/week: 0.0 standard drinks   ??? Drug use: No       SURGICAL HISTORY:   Past Surgical History:   Procedure Laterality Date   ??? COLONOSCOPY     ??? COLONOSCOPY N/A 12/28/2016    COLONOSCOPY WITH BIOPSY performed by [name], MD at [facility] ENDOSCOPY   ??? COLONOSCOPY N/A 04/24/2020    COLONOSCOPY POLYPECTOMY SNARE/COLD BIOPSY performed by [name], MD at [facility] ENDOSCOPY   ??? CYSTOSCOPY      prostate biopsy - 06/2015    ??? ENDOSCOPY, COLON, DIAGNOSTIC  01/22/2014   ??? GASTRIC FUNDOPLICATION  2005   ??? HERNIA REPAIR     ??? NASAL POLYP SURGERY     ??? PROSTATE BIOPSY  2011    negative pathology   ??? SEPTOPLASTY     ??? TUMOR REMOVAL  11/2010    fatty tumor removed Dr Lavone Nian   ??? UPPER GASTROINTESTINAL ENDOSCOPY  12/2009    with biopsy, pathology negative   ??? UPPER GASTROINTESTINAL ENDOSCOPY N/A 12/28/2016    EGD BIOPSY performed by [name], MD at [facility] ENDOSCOPY   ??? UPPER GASTROINTESTINAL ENDOSCOPY N/A 04/24/2020    EGD BIOPSY performed by [name], MD at [facility] ENDOSCOPY       FAMILY HISTORY:   Family History   Problem Relation Age of Onset   ??? High Blood Pressure Father    ??? High Cholesterol Father     ??? Asthma Sister    ???  Diabetes Sister        REVIEW OF SYSTEMS:   Constitutional: General health good . No weight changes.  No fevers, fatigue or weakness.   Head: Patient denies any history of trauma, convulsive disorder or syncope.    Skin:  Patient denies history of changes in pigmentation, eruptions or pruritus.  No easy bruising or bleeding.  EENT: Patient denies any history of color blindness, photophobia, diplopia, inflammation, cataracts or glaucoma.   Patient denies history of deafness, tinnitus, pain, discharge or recurrent infections.    Patient has a long history of rhinitis, chronic nasal discharge, drainage, & nasal polyps; all requiring surgery. Patient denies history of soreness of mouth or tongue.   Patient denies history of hoarseness, voice changes, sore throats or tonsillitis.    Lymphatic:  Patient denies history of enlargement, inflammation, pain or suppuration.    Cardiovascular:  Patient denies history of palpations, heart murmur, irregular rhythm, chest pain, exertional dyspnea, cyanosis, ascites, rheumatic fever, cold extremities or edema.  Respiration:  Patient denies wheezing, dyspnea, nocturnal dyspnea, cough, hemoptysis, pleurisy, TB or asthma.   Known OSA on PAP. 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   Gastrointestinal: Patient denies changes in appetite. No dysphagia, odynophagia or abdominal pain.  No hematochezia, melena, bowel habit changes or hemorrhoids.    No jaundice. GERD Gastic surgery in past with poor results.   Genitourinary:   Patient denies dysuria, frequency, urgency or incontinence.  Complains of difficulty in starting or stopping urinary stream.    Reports history of stones which is followed by [facility].  He has benign prostatic hypertrophy. (2017) prostate cancer. + Cancer Gleeson 6 + Biopsy.   Musculoskeletal: The patient denies history of arthritis or joint pains.  No loss of strength or dislocation.  Breasts:  No history of masses, lumps, pain, nipple  changes or painful nipples.  Neurological: Patient denies vertigo, syncope.  No twitching or memory.   Psychological: Patient denies moodiness, depression or anxiety.  No obsessions or hallucinations.    Endocrine:  No history of goiter, exophthalmos or dryness of skin.  No polyuria or polyphagia.  No diabetes.   Hematopoietic:  No history of bleeding disorders or easy bruising.  Rheumatic:  No polyarthritis or inflammatory joint disease. History of positive P-ANCA    PHYSICAL EXAMINATION:  Vitals:    04/26/20 1308   BP: (!) 168/74   Pulse: 61   Resp: 18   SpO2: 97%   Weight: 204 lb (92.5 kg)   Height: 5\' 9"  (1.753 m)     Constitutional: A 79 y.o.  male  who is alert, oriented, cooperative and in no apparent distress.  Head was normocephalic and atraumatic.  Male patten baldness.   EENT: Eyes were equal and reactive to light and accommodation.  No discharge was appreciated.  Septum was mildly deviated, mucosa was without erythema, exudates or cobblestoning.  No thrush.  Swollen turbinates.   Neck: Supple without thyromegaly. No elevated JVP. Trachea was midline. No carotid bruits.    Respiratory: Symmetrical without dullness to percussion.  Breath sounds bilaterally were clear to auscultation. No wheezes, rhonchi or rales. No egophony.   Cardiovascular: Regular without murmur, clicks, gallops or rubs.  No heave.    Pulses:  Equal bilaterally.    Abdomen: Soft without organomegaly. No rebound, rigidity or guarding.  Surgical (+) scars  Lymphatic: No lymphadenopathy.  Musculoskeletal:No gross muscle weakness.    Extremities:  No lower extremity edema.  Muscle size, tone and  strength are normal.    Sensory function appears intact.  Deep tendon reflexes are normal.   Skin:  Warm and dry.  Good color, turgor and pigmentation. No lesions. Varicoties. No rash.   Neurological/Psychiatric: General behavior, level of consciousness, thought content and emotional status is normal.  Cranial nerves II-XII are intact.      DATA:      Today Spirometry compared to previous demonstrates an FVC of 2.64 which is 73 % of predicted with an FEV1 of 2.12 liters which is 78 % of predicted.  FEV1/FVC ratio is 80%.  Maximum voluntary ventilation is 128 liters per minute or 99 % of predicted.  Flow volume loop shows no signs of intrathoracic or extrathoracic obstruction. Impression. slight restriction    Asthma Control Test [ACT] is a 5 part questionnaire, which has a point value system that ranges from 1- 5. This test is a validated questionnaire to help determine asthma control.  A total score of 19 or below indicates that the patients asthma may not be well controlled. If used in conjunction with PFTs, the correlation of determining asthma control is even stronger.  Today on testing the ACT was 22 out of max of 25. Impression: Poor control.     Epworth Sleepiness Scale is use this Tool to Measure Sleep Deprivation. The Epworth Sleepiness Scale was developed by researchers in [place] and is widely used by sleep professionals around the world to measure sleep deprivation.  How likely are you to doze off or fall asleep in the following situations, in contrast to feeling just tired? This refers to your usual way of life in recent times.  Results: Total Epworth Score: 3  Interpretation: No hypersomnolence.    IMPRESSION:      Julian Castaneda a 79 y.o. male reactive airway disease, chronic cough from GERD S/p Nissen and severe nasal polyposis S/p surgery x 2. While we have never proven the systemic vasculitis or necrotizing vasculitis part and he has no renal, neuropathic or cardiovascular involvement; the finding of chronic sinusitis, eosinophilia, pANCA (+) and asthma aid in the diagnosis.  He has no neuropathy or neuropathic mononeuritis. His p-ANCA was 1:160 but fluctuates.  However, the clinical utility of ANCA in CSS raises a controversial issue of the sensitivity and specificity of ANCA in systemic vasculitis. In CSS P-ANCA is 50%.  He should  never receive leukotriene modifier, allopurinol, propylthiouracil and quinine with this history of CSS for it will cause active flairs.  So we have been monitoring and he is without flair or recurrence = false + test.  With his chronic allergies, sinusitis with rhinitis and nasal  polyposis is stable after surgery. He also has a failed Nissen fundoplication with chronic gastroparesis and GERD stable based on last. As for his sleep apnea he is doing very well with his CPAP @ 12 cm H20.  With this in mind, we would like to proceed with the following;                PLAN:  Nothing new to add but continue to monitor. He is concerned of his high blood pressure and weight gain. He remains active. He was up visiting family and following up on some of his other doctors visits with seem to have gone well.  He still walking every morning.  He does not use inhalers.  His Epworth sleepiness score was normal.  Sleep apnea symptoms are excellent and we see no need to adjust therapy. We adjusted his  new machine.  He will follow up in 6 months to a year for re-evaluation.  Vaccines were reviewed and current. We hope this updates you on my evaluation and clinical thinking. Thank you for allowing us to participate in Rana SnareVincent J Castaneda care.     Sincerely,    Elmer Rampimothy J. Charmin Aguiniga, D.O., MPH, Ane PaymentFCCP, FACOI, FACP  Professor of Internal Medicine  Director, [facility]

## 2020-05-13 NOTE — Telephone Encounter (Signed)
Pt was given results of egd/colon per Dr Lavone Nian

## 2020-10-16 ENCOUNTER — Encounter: Admit: 2020-10-16 | Discharge: 2020-10-16 | Payer: MEDICARE | Attending: Surgery | Primary: Family Medicine

## 2020-10-16 DIAGNOSIS — K253 Acute gastric ulcer without hemorrhage or perforation: Secondary | ICD-10-CM

## 2020-10-16 NOTE — Progress Notes (Signed)
Patient's Name/Date of Birth: DELOIS TOLBERT / December 08, 1940    Date: 10/16/2020    PCP: [name], MD    Chief Complaint   Patient presents with   ??? Results     EGD/COLON   ??? Discuss Medications       HPI:  Post-op follow up  Patient presents to the clinic 2 weeks following panendoscopy. Found to have gastric ulcer, treated accordingly. Eating a regular diet without difficulty. Bowel movements are loose. The patient is not having any pain. no problems with eating, bowel movements, voiding.    Patient's medications, allergies, past medical, surgical, social and family histories were reviewed and updated as appropriate.    Review of Systems  Constitutional: negative  Eyes: negative  Ears, nose, mouth, throat, and face: negative  Respiratory: negative  Cardiovascular: negative  Gastrointestinal: negative  Genitourinary:negative  Integument/breast: negative  Hematologic/lymphatic: negative  Musculoskeletal:negative  Neurological: negative  Allergic/Immunologic: negative      Physical Exam:  BP (!) 145/71    Pulse 63    Resp 16    Ht 5\' 9"  (1.753 m)    Wt 205 lb (93 kg)    BMI 30.27 kg/m??   General Appearance: alert and oriented to person, place and time, well-developed and well-nourished, in no acute distress  Oropharynx:  lips, mucosa, and tongue normal; teeth and gums normal   Neck:  no adenopathy, no carotid bruit, no JVD, supple, symmetrical, trachea midline and thyroid not enlarged, symmetric, no tenderness/mass/nodules   Lung:  clear to auscultation bilaterally   Heart:   regular rate and rhythm, S1, S2 normal, no murmur, click, rub or gallop   Abdomen:  soft, non-tender; bowel sounds normal; no masses,  no organomegaly   Extremities:  extremities normal, atraumatic, no cyanosis or edema   Skin:  warm and dry, no hyperpigmentation, vitiligo, or suspicious lesions   Genitourinary:  defer exam     Data Reviewed: surgical pathology results and endoscopy results    Assessment/Plan:  Navon was seen today for  results and discuss medications.    Diagnoses and all orders for this visit:    Acute gastric ulcer without hemorrhage or perforation    Continue PPI  High fiber diet    No follow-ups on file.    Oswaldo Done, MD

## 2020-10-18 ENCOUNTER — Ambulatory Visit
Admit: 2020-10-18 | Discharge: 2020-10-18 | Payer: MEDICARE | Attending: Critical Care Medicine | Primary: Family Medicine

## 2020-10-18 DIAGNOSIS — J45909 Unspecified asthma, uncomplicated: Secondary | ICD-10-CM

## 2020-10-18 LAB — SPIROMETRY WITHOUT BRONCHODILATOR
FEV1 %Pred-Pre: 67 %
FEV1 Pred: 2.79 L
FEV1/FVC %Pred-Pre: 96 %
FEV1/FVC Pred: 75 %
FEV1/FVC: 72 %
FEV1: 1.87 L
FVC %Pred-Pre: 69 %
FVC Pred: 3.76 L
FVC: 2.6 L

## 2020-10-18 NOTE — Progress Notes (Signed)
[facility]  Department of Pulmonary, Critical Care and Sleep Medicine  [facility]  Department of Internal Medicine  Office Note      Dear Julian Picket Wayne Sever, MD    We had the pleasure of seeing  Julian Castaneda in the [facility] regarding his chronic sinusitis, GERD S/p Nissen, reactive airway disease & obstructive sleep apnea.     HISTORY OF PRESENT ILLNESS:    Julian Castaneda is a 80 y.o. year old male who has a long standing history of allergies, elevated Ig E level around 600 requiring the administration of Xolair therapy. 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 Last level in 06/2010 was negative. The her recall He under when endoscopic surgery on 06/30/07 antrostomy bilateral with ethmoidectomies and left single sphenoidotomies and septoplasty. His treatment is working well, which included nasal rinses, Patanase, Astelin, and Xolair. I will  With his sleep apnea he is very compliant using the CPAP machine nightly for at least 6 hour. In 2019, he has a elevated PSA and a recent biopsy done showed prostate cancer Julian Castaneda score 6).  He is holding on treatment. He has long standing GERD requiring anti-reflux surgery years ago. His last EGD which was negative by Dr. Lavone Castaneda he has had no repeat complaints. His antireflux surgery anastomosis was stable and clear. In 2019, MRI was done and ok. He did have a ER visit in [place], [place] for what appears to be dehydration, he underwent a echocardiogram and stress test.     Since the last visit, Julian Castaneda is doing well. He has had no pulmonary complaint.  He has no new complaints. As far as his sleep apnea is concerned, he has been compliant with his CPAP 12 cm H20 and uses it  nightly. His ACT is 24/25. He has no CP, SOB or leg edema. No neurologic symptoms.      ALLERGIES:    Allergies   Allergen Reactions   ??? Enalapril Swelling     Of lower lip   ??? Esomeprazole Hives and Swelling   ??? Nexium [Esomeprazole Magnesium Trihydrate] Swelling   ??? Nsaids Swelling   ??? Ranitidine Hives and Swelling   ??? Reglan [Metoclopramide Hcl] Other (See Comments)     States has bloating with this       PAST MEDICAL HISTORY:       Diagnosis Date   ??? Asthma     doing well per patient   ??? Chronic pansinusitis 10/03/2014   ??? COPD (chronic obstructive pulmonary disease) (HCC)    ??? CSS (Churg-Strauss syndrome) (HCC)     no current issues   ??? Elevated PSA     negative biospy   ??? Encounter for screening colonoscopy     and EGD 12-28-16    ??? GERD (gastroesophageal reflux disease)    ??? Hyperlipidemia    ??? Hypertension    ??? OSA on CPAP    ??? OSA treated with BiPAP 03/27/2014   ??? Sepsis (HCC)     06-2015 - resolved         MEDICATIONS:   Current Outpatient Medications   Medication Sig Dispense Refill   ??? omeprazole (PRILOSEC) 40 MG delayed release capsule Take 1 capsule by mouth every morning (  before breakfast) 90 capsule 1   ??? fluticasone (FLONASE) 50 MCG/ACT nasal spray fluticasone propionate 50 mcg/actuation nasal spray,suspension     ??? losartan (COZAAR) 100 MG tablet Take 100 mg by mouth daily      ??? vitamin D (CHOLECALCIFEROL) 1000 UNIT TABS tablet Take 1,000 Units by mouth daily LD 12-23-16     ??? finasteride (PROSCAR) 5 MG tablet Take 5 mg by mouth daily States he will not take until he is done with his daily antibiotics     ??? amLODIPine (NORVASC) 10 MG tablet Take 10 mg by mouth daily      ??? atorvastatin (LIPITOR) 80 MG tablet Take 80 mg by mouth daily.       No current facility-administered medications for this visit.       SOCIAL AND OCCUPATIONAL HEALTH:  He is married with four children. One son died from HF, daughter died from CAD all young. Wife has dementia (advanced). The patient is a never smoked  There is not  history of TB or TB exposure.  No Etoh Abuse.  There is not asbestos or silica dust exposure.  The patient reports no coal, foundry, quarry or [employer] exposure.  Travel history reveals no trips.  Denies history of recreational or IV drug use. Denies hot tub exposure. The patient has no pets. Hobbies include watching soap opera.  His wife passed away and we reflected on her death and discussed the issues with hospice and the cemetery.     Social History     Tobacco Use   ??? Smoking status: Never Smoker   ??? Smokeless tobacco: Never Used   Vaping Use   ??? Vaping Use: Never used   Substance Use Topics   ??? Alcohol use: No     Alcohol/week: 0.0 standard drinks   ??? Drug use: No       SURGICAL HISTORY:   Past Surgical History:   Procedure Laterality Date   ??? COLONOSCOPY     ??? COLONOSCOPY N/A 12/28/2016    COLONOSCOPY WITH BIOPSY performed by Julian Maizes, MD at [facility] ENDOSCOPY   ??? COLONOSCOPY N/A 04/24/2020    COLONOSCOPY POLYPECTOMY SNARE/COLD BIOPSY performed by Julian Maizes, MD at [facility] ENDOSCOPY   ??? CYSTOSCOPY      prostate biopsy - 06/2015    ??? ENDOSCOPY, COLON, DIAGNOSTIC  01/22/2014   ??? GASTRIC FUNDOPLICATION  2005   ??? HERNIA REPAIR     ??? NASAL POLYP SURGERY     ??? PROSTATE BIOPSY  2011    negative pathology   ??? SEPTOPLASTY     ??? TUMOR REMOVAL  11/2010    fatty tumor removed Dr Julian Castaneda   ??? UPPER GASTROINTESTINAL ENDOSCOPY  12/2009    with biopsy, pathology negative   ??? UPPER GASTROINTESTINAL ENDOSCOPY N/A 12/28/2016    EGD BIOPSY performed by Julian Maizes, MD at [facility] ENDOSCOPY   ??? UPPER GASTROINTESTINAL ENDOSCOPY N/A 04/24/2020    EGD BIOPSY performed by Julian Maizes, MD at [facility] ENDOSCOPY       FAMILY HISTORY:   Family History   Problem Relation Age of Onset   ??? High Blood Pressure Father    ??? High Cholesterol Father    ??? Asthma Sister    ??? Diabetes Sister        REVIEW OF SYSTEMS:   Constitutional: General health good . No weight changes.  No fevers, fatigue or weakness.   Head: Patient denies any history of trauma, convulsive  disorder or  syncope.    Skin:  Patient denies history of changes in pigmentation, eruptions or pruritus.  No easy bruising or bleeding.  EENT: Patient denies any history of color blindness, photophobia, diplopia, inflammation, cataracts or glaucoma.   Patient denies history of deafness, tinnitus, pain, discharge or recurrent infections.    Patient has a long history of rhinitis, chronic nasal discharge, drainage, & nasal polyps; all requiring surgery. Patient denies history of soreness of mouth or tongue.   Patient denies history of hoarseness, voice changes, sore throats or tonsillitis.    Lymphatic:  Patient denies history of enlargement, inflammation, pain or suppuration.    Cardiovascular:  Patient denies history of palpations, heart murmur, irregular rhythm, chest pain, exertional dyspnea, cyanosis, ascites, rheumatic fever, cold extremities or edema.  Respiration:  Patient denies wheezing, dyspnea, nocturnal dyspnea, cough, hemoptysis, pleurisy, TB or asthma.   Known OSA on PAP. 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   Gastrointestinal: Patient denies changes in appetite. No dysphagia, odynophagia or abdominal pain.  No hematochezia, melena, bowel habit changes or hemorrhoids.    No jaundice. GERD Gastic surgery in past with poor results.   Genitourinary:   Patient denies dysuria, frequency, urgency or incontinence.  Complains of difficulty in starting or stopping urinary stream.    Reports history of stones which is followed by [facility].  He has benign prostatic hypertrophy. (2017) prostate cancer. + Cancer Gleeson 6 + Biopsy.   Musculoskeletal: The patient denies history of arthritis or joint pains.  No loss of strength or dislocation.  Breasts:  No history of masses, lumps, pain, nipple changes or painful nipples.  Neurological: Patient denies vertigo, syncope.  No twitching or memory.   Psychological: Patient denies moodiness, depression or anxiety.  No obsessions or hallucinations.    Endocrine:   No history of goiter, exophthalmos or dryness of skin.  No polyuria or polyphagia.  No diabetes.   Hematopoietic:  No history of bleeding disorders or easy bruising.  Rheumatic:  No polyarthritis or inflammatory joint disease. History of positive P-ANCA    PHYSICAL EXAMINATION:  Vitals:    10/18/20 1327   BP: (!) 189/86   Pulse: 66   Resp: 18   Temp: 93.9 ??F (34.4 ??C)   SpO2: 96%   Weight: 204 lb (92.5 kg)   Height: 5\' 9"  (1.753 m)     Constitutional: A 80 y.o.  male  who is alert, oriented, cooperative and in no apparent distress.  Head was normocephalic and atraumatic.  Male patten baldness.   EENT: Eyes were equal and reactive to light and accommodation.  No discharge was appreciated.  Septum was mildly deviated, mucosa was without erythema, exudates or cobblestoning.  No thrush.  Swollen turbinates.   Neck: Supple without thyromegaly. No elevated JVP. Trachea was midline. No carotid bruits.    Respiratory: Symmetrical without dullness to percussion.  Breath sounds bilaterally were clear to auscultation. No wheezes, rhonchi or rales. No egophony.   Cardiovascular: Regular without murmur, clicks, gallops or rubs.  No heave.    Pulses:  Equal bilaterally.    Abdomen: Soft without organomegaly. No rebound, rigidity or guarding.  Surgical (+) scars  Lymphatic: No lymphadenopathy.  Musculoskeletal:No gross muscle weakness.    Extremities:  No lower extremity edema.  Muscle size, tone and strength are normal.    Sensory function appears intact.  Deep tendon reflexes are normal.   Skin:  Warm and dry.  Good color, turgor and pigmentation. No lesions. Varicoties. No  rash.   Neurological/Psychiatric: General behavior, level of consciousness, thought content and emotional status is normal.  Cranial nerves II-XII are intact.      DATA:     Today Spirometry compared to previous demonstrates an FVC of 2.64 which is 74 % of predicted with an FEV1 of 2.12 liters which is 77 % of predicted.  FEV1/FVC ratio is 79%.  Maximum  voluntary ventilation is 128 liters per minute or 99 % of predicted.  Flow volume loop shows no signs of intrathoracic or extrathoracic obstruction. Impression.  Nonspecific ventilatory limitation favoring restrictive physiology    Asthma Control Test [ACT] is a 5 part questionnaire, which has a point value system that ranges from 1- 5. This test is a validated questionnaire to help determine asthma control.  A total score of 19 or below indicates that the patients asthma may not be well controlled. If used in conjunction with PFTs, the correlation of determining asthma control is even stronger.  Today on testing the ACT was 24 out of max of 25. Impression: Poor control.     Epworth Sleepiness Scale is use this Tool to Measure Sleep Deprivation. The Epworth Sleepiness Scale was developed by researchers in [place] and is widely used by sleep professionals around the world to measure sleep deprivation.  How likely are you to doze off or fall asleep in the following situations, in contrast to feeling just tired? This refers to your usual way of life in recent times.  Results: Total Epworth Score: 3  Interpretation: No hypersomnolence.    IMPRESSION:      Julian Castaneda a 80 y.o. male reactive airway disease, chronic cough from GERD S/p Nissen and severe nasal polyposis S/p surgery x 2. While we have never proven the systemic vasculitis or necrotizing vasculitis part and he has no renal, neuropathic or cardiovascular involvement; the finding of chronic sinusitis, eosinophilia, pANCA (+) and asthma aid in the diagnosis.  He has no neuropathy or neuropathic mononeuritis. His p-ANCA was 1:160 but fluctuates.  However, the clinical utility of ANCA in CSS raises a controversial issue of the sensitivity and specificity of ANCA in systemic vasculitis. In CSS P-ANCA is 50%.  He should never receive leukotriene modifier, allopurinol, propylthiouracil and quinine with this history of CSS for it will cause active flairs.  So  we have been monitoring and he is without flair or recurrence = false + test.  With his chronic allergies, sinusitis with rhinitis and nasal  polyposis is stable after surgery. He also has a failed Nissen fundoplication with chronic gastroparesis and GERD stable based on last. As for his sleep apnea he is doing very well with his CPAP @ 12 cm H20.  With this in mind, we would like to proceed with the following;                PLAN:  Nothing new to add but continue to monitor.  Touched base about politics, his girlfriend, his new Corvette and [place] that he brought with his son.  He is concerned of his high blood pressure and weight gain. He remains active. He was up visiting family and following up on some of his other doctors visits with seem to have gone well.  He still walking every morning.  He does not use inhalers.  His Epworth sleepiness score was normal.  Sleep apnea symptoms are excellent and we see no need to adjust therapy. We adjusted his new machine.  He will follow up in 6  months to a year for re-evaluation.  Vaccines were reviewed and current.  We hope this updates you on my evaluation and clinical thinking. Thank you for allowing us to participate in Julian Castaneda care.     Sincerely,    Elmer Rampimothy J. Khadeeja Elden, D.O., MPH, Ane PaymentFCCP, FACOI, FACP  Professor of Internal Medicine  Director, [facility]

## 2020-10-18 NOTE — Progress Notes (Signed)
No change.  Patient to follow up with physician in 6 months.

## 2020-12-09 MED ORDER — OMEPRAZOLE 40 MG PO CPDR
40 MG | ORAL_CAPSULE | ORAL | 1 refills | Status: AC
Start: 2020-12-09 — End: ?

## 2020-12-09 NOTE — Telephone Encounter (Signed)
LOV 10/16/20

## 2021-04-25 ENCOUNTER — Encounter: Attending: Critical Care Medicine | Primary: Family Medicine

## 2021-08-11 IMAGING — DX KNEE 3 VIEWS RIGHT
1 series · 3 of 3 positions shown · non-contrast
Comparison: None.

________________________________________________________________________________________________ 
KNEE 3 VIEWS RIGHT, KNEE 3 VIEWS LEFT, 08/11/2021 [DATE]: 
CLINICAL INDICATION: Bilateral knee pain

[Series 1: ap int rot · U · 0.14mm/px · 3 of 3 slices shown]
[im 1/3]
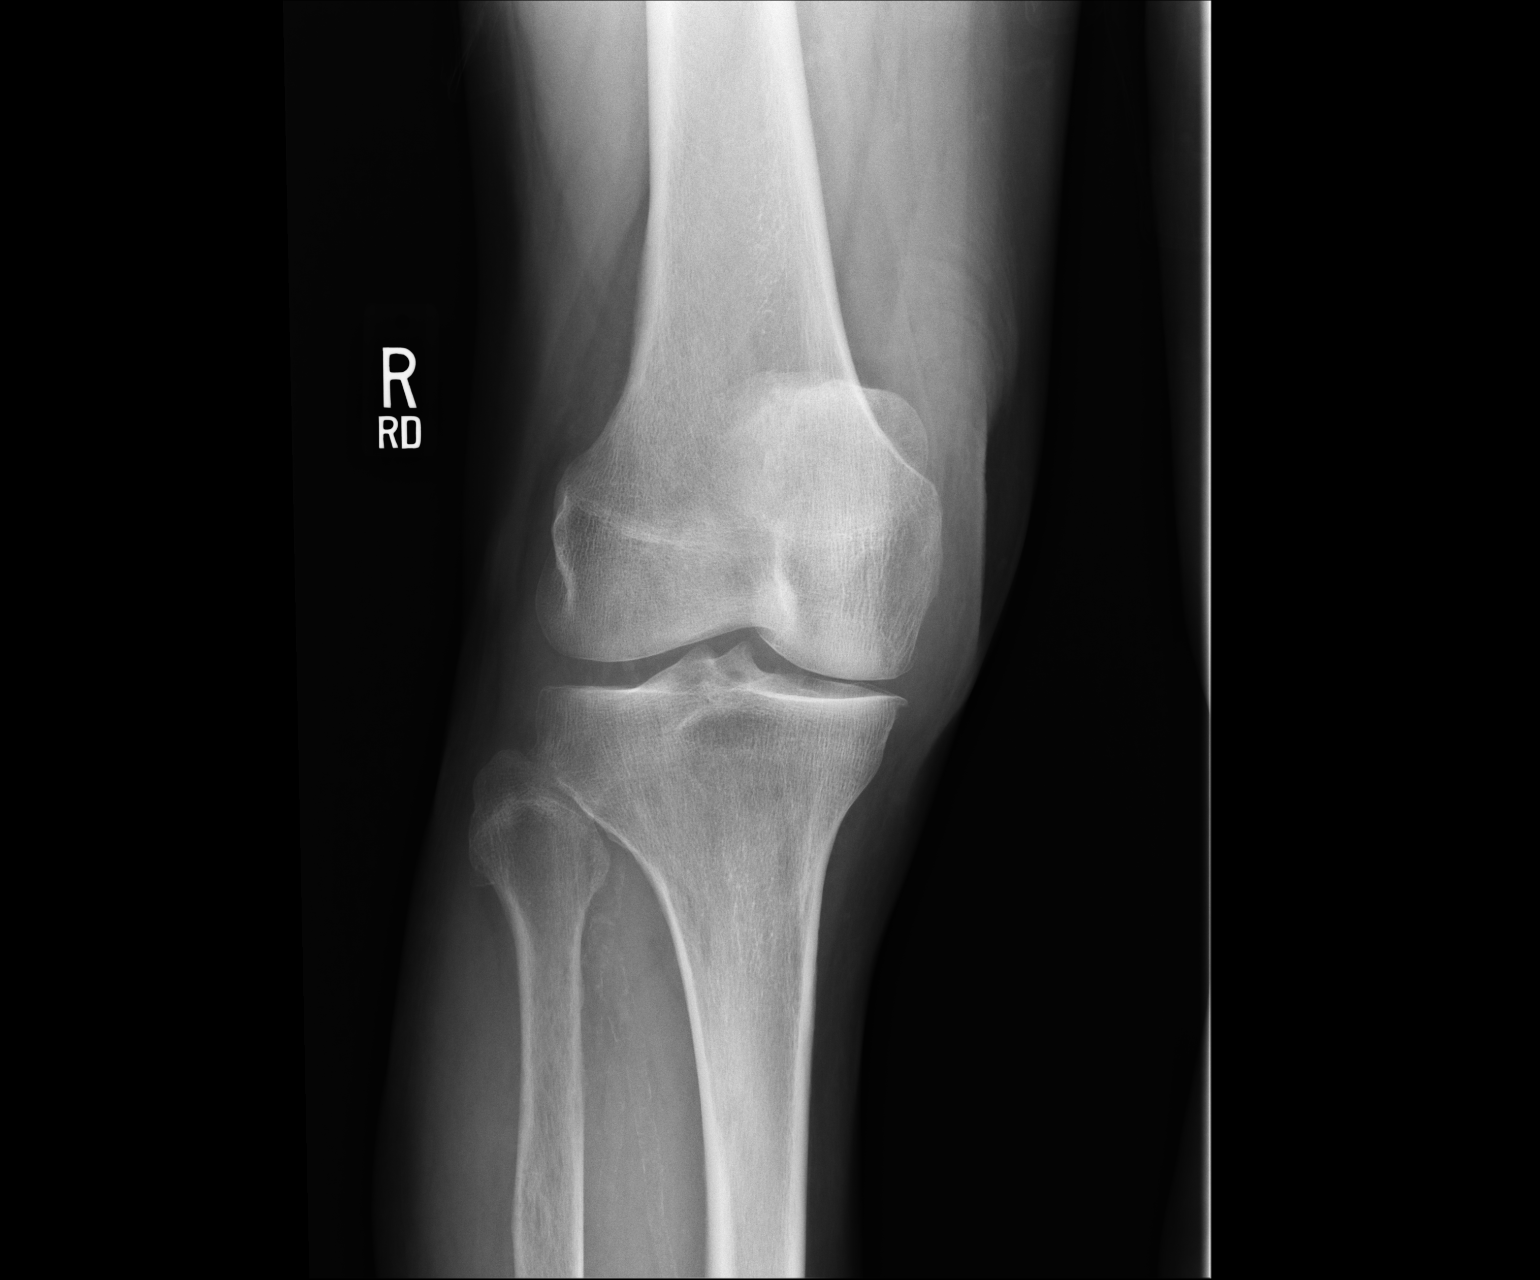
[im 2/3]
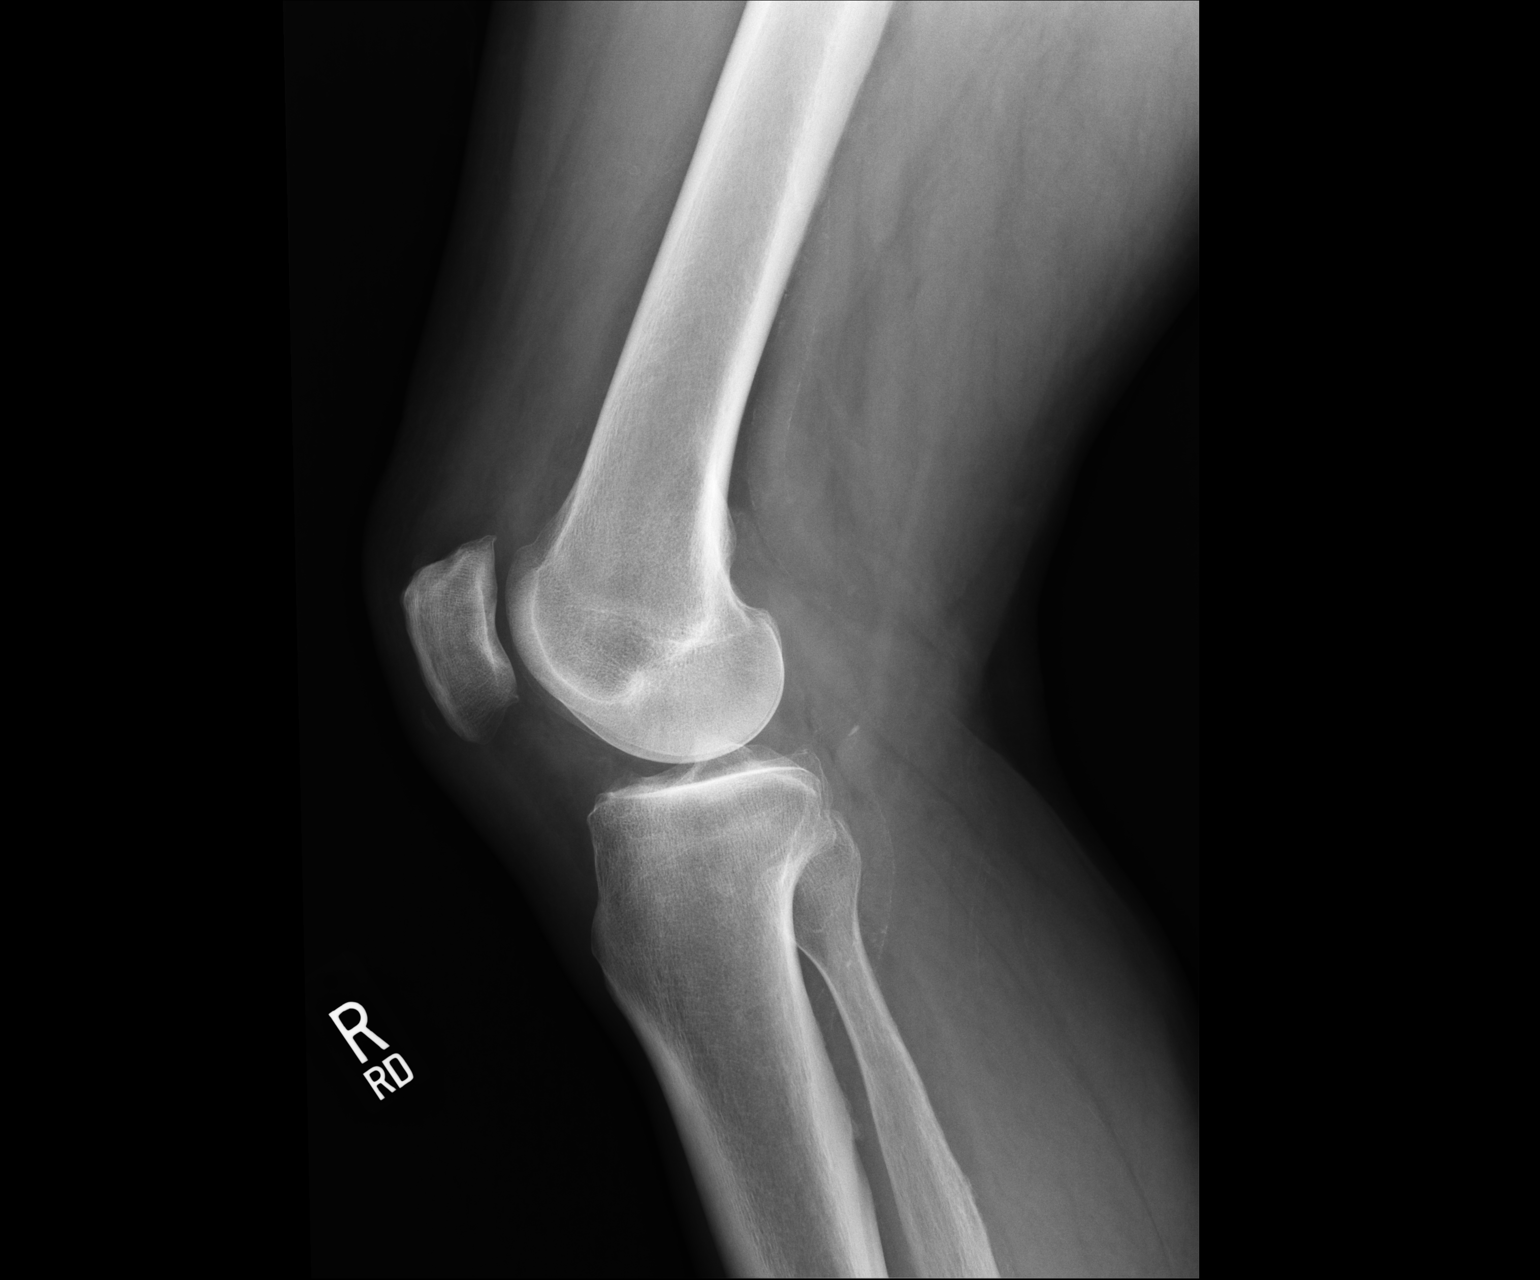
[im 3/3]
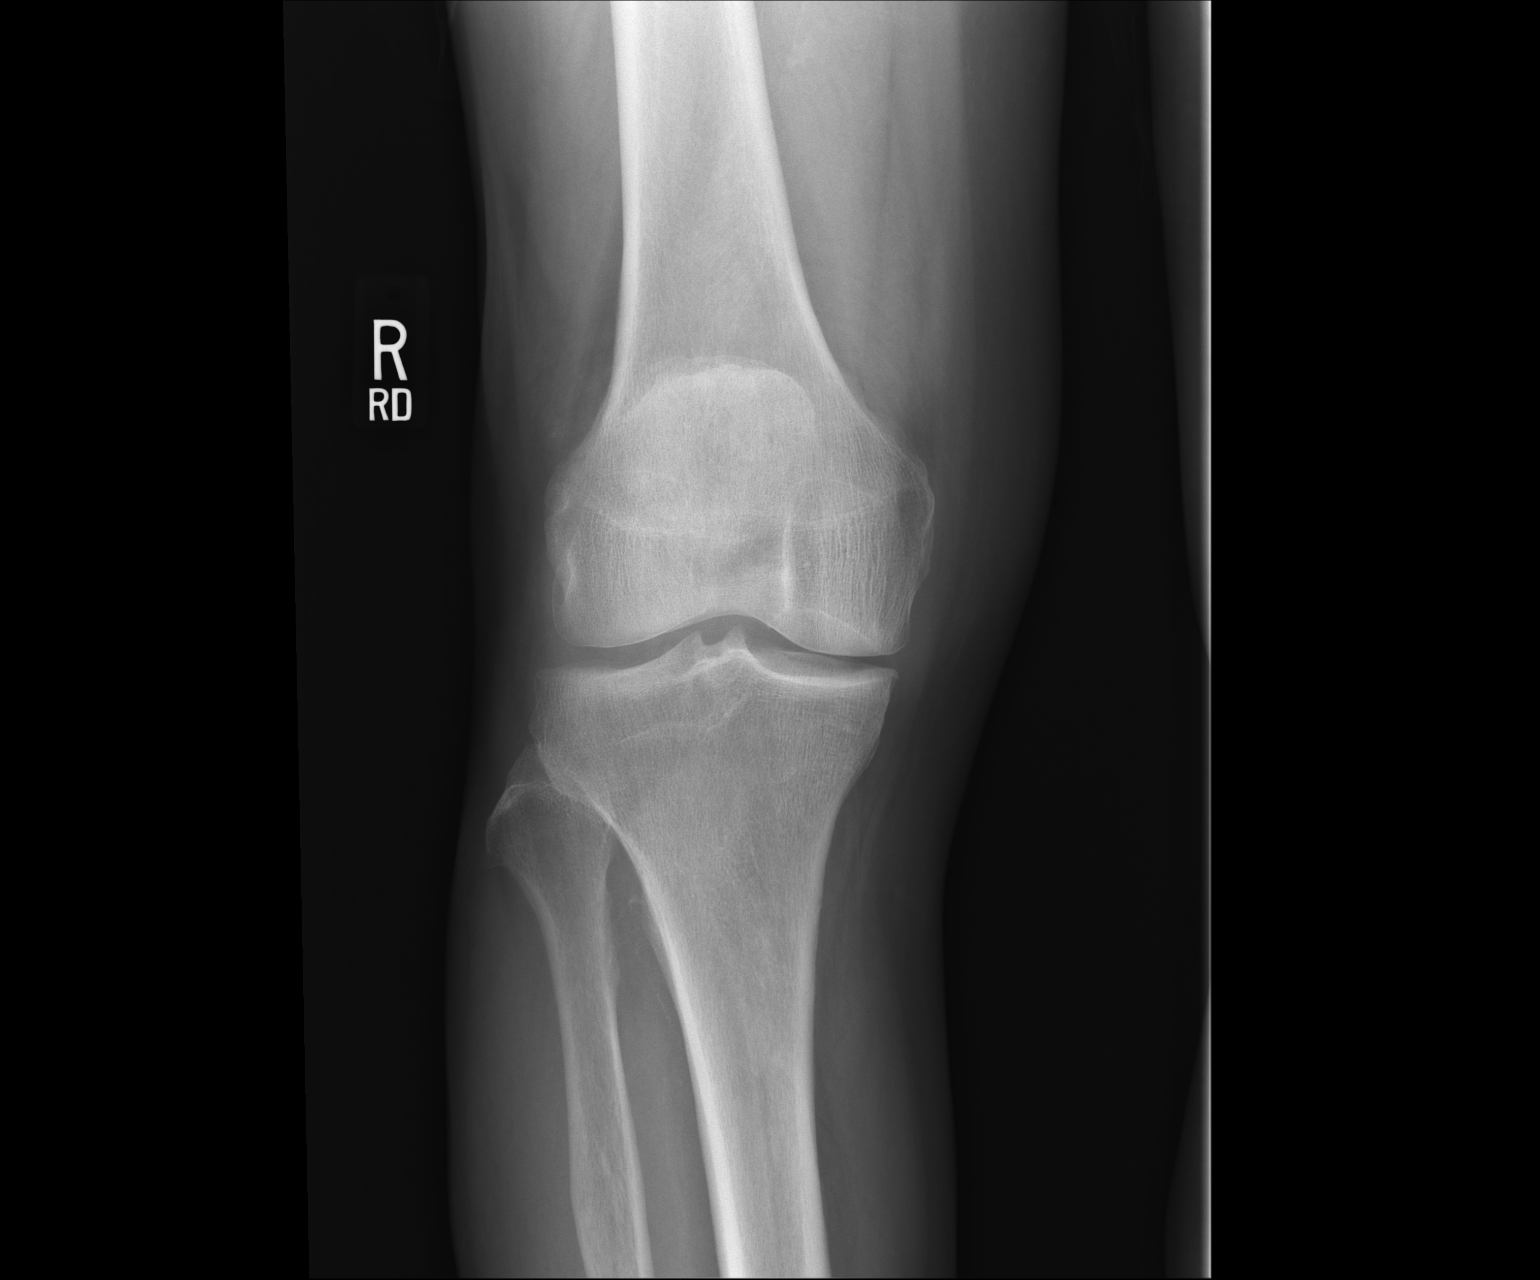

[3 of 3 positions shown; findings below may reference images not displayed]

FINDINGS: No fracture. Mild medial compartment joint space narrowing and mild 
genu varum. Mild medial/patellofemoral joint osteophytes. Small right joint 
effusion. Atherosclerosis.
IMPRESSION: Mild degenerative change of the knees and small right joint effusion.

## 2021-08-11 IMAGING — DX KNEE 3 VIEWS LEFT
1 series · 3 of 3 positions shown · non-contrast
Comparison: None.

________________________________________________________________________________________________ 
KNEE 3 VIEWS RIGHT, KNEE 3 VIEWS LEFT, 08/11/2021 [DATE]: 
CLINICAL INDICATION: Bilateral knee pain

[Series 1: ap int rot · U · 0.14mm/px · 3 of 3 slices shown]
[im 1/3]
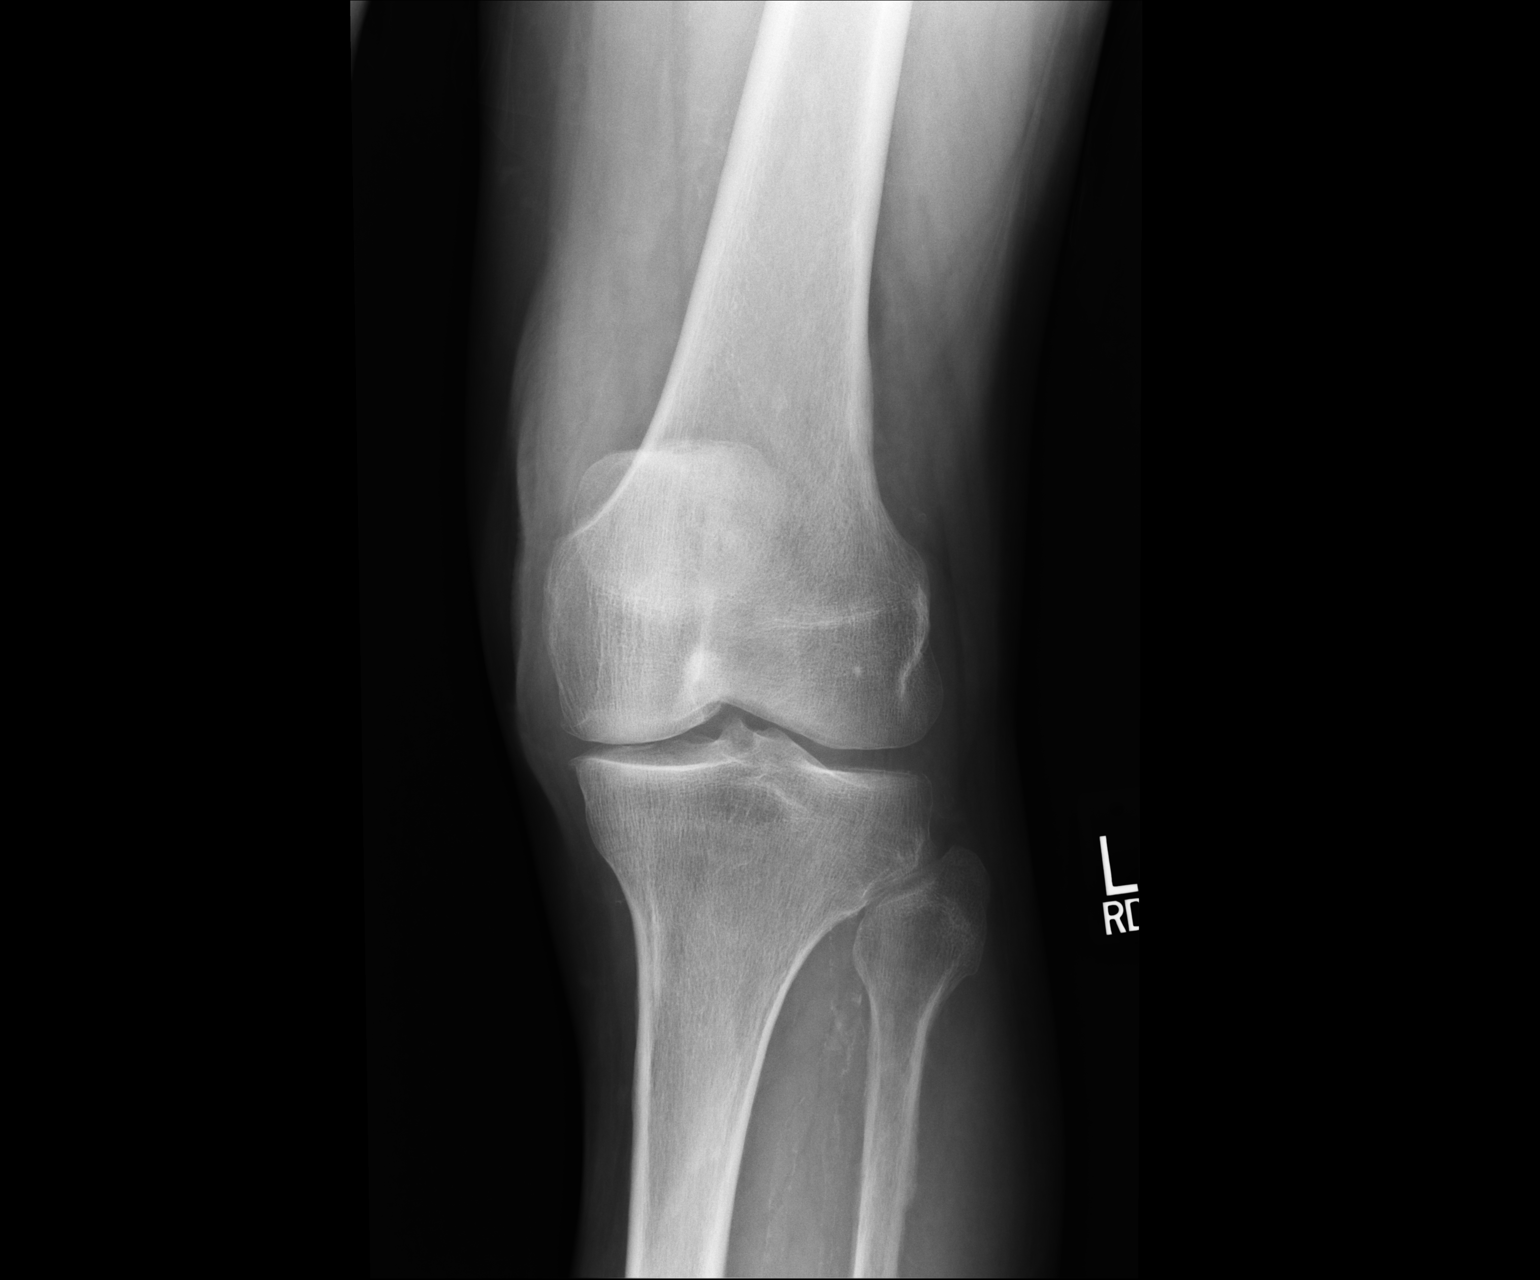
[im 2/3]
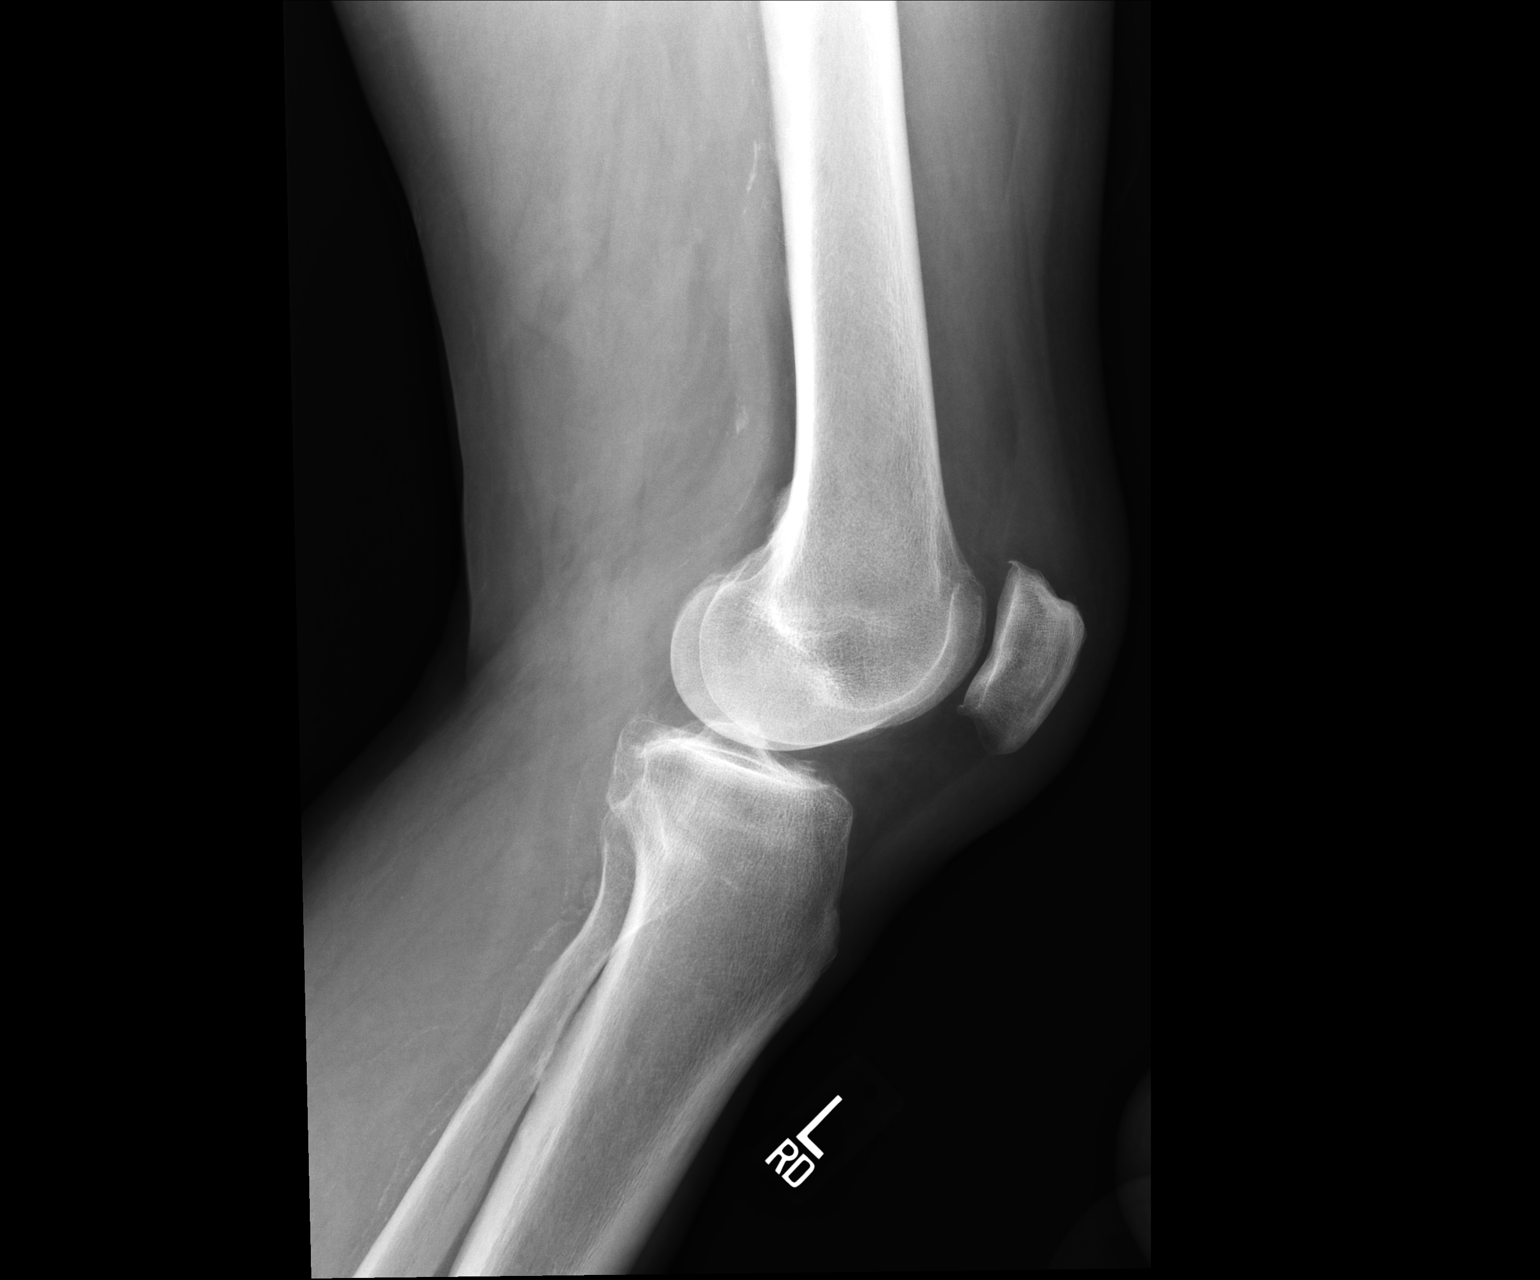
[im 3/3]
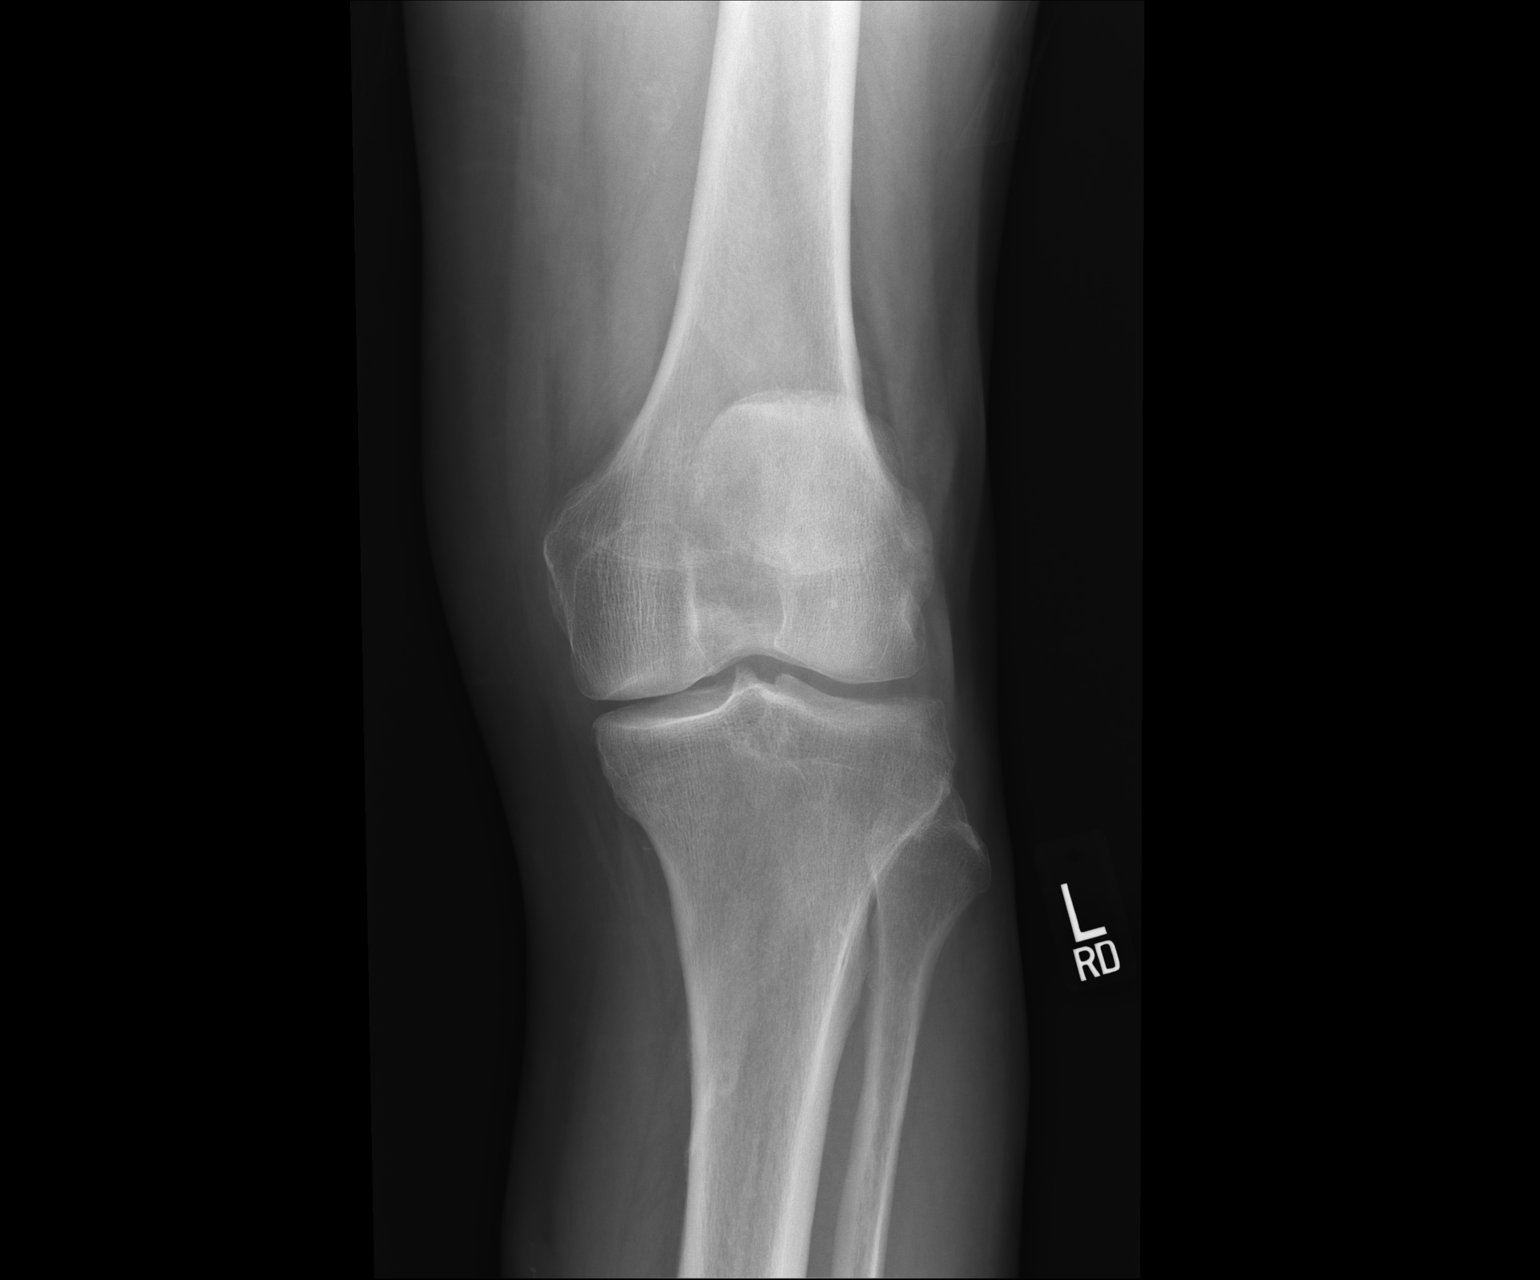

[3 of 3 positions shown; findings below may reference images not displayed]

FINDINGS: No fracture. Mild medial compartment joint space narrowing and mild 
genu varum. Mild medial/patellofemoral joint osteophytes. Small right joint 
effusion. Atherosclerosis.
IMPRESSION: Mild degenerative change of the knees and small right joint effusion.

## 2021-08-11 IMAGING — DX HIP 2 VIEWS LEFT WITH PELVIS
1 series · 3 of 3 positions shown · non-contrast
Comparison: None.

________________________________________________________________________________________________ 
HIP 2 VIEWS LEFT WITH PELVIS, 08/11/2021 [DATE]: 
CLINICAL INDICATION: Left hip pain

[Series 1: AP · U · 0.14mm/px · 3 of 3 slices shown]
[im 1/3]
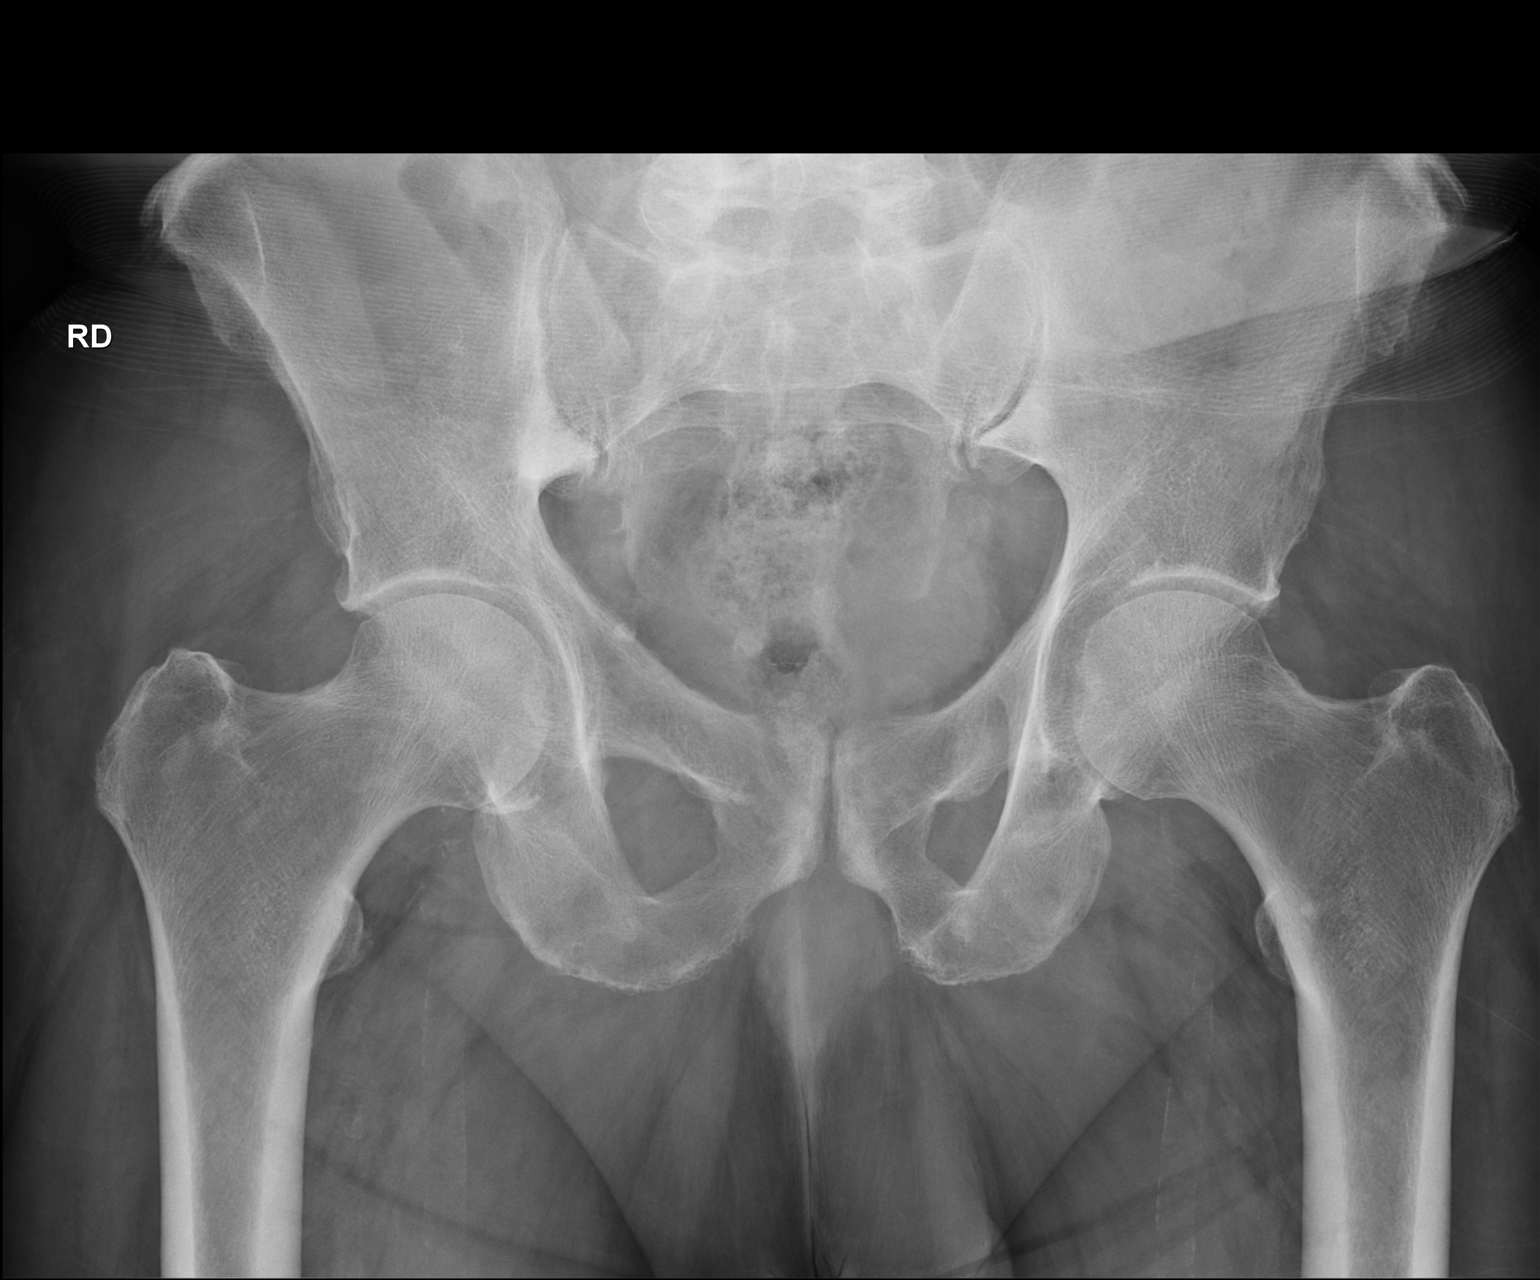
[im 2/3]
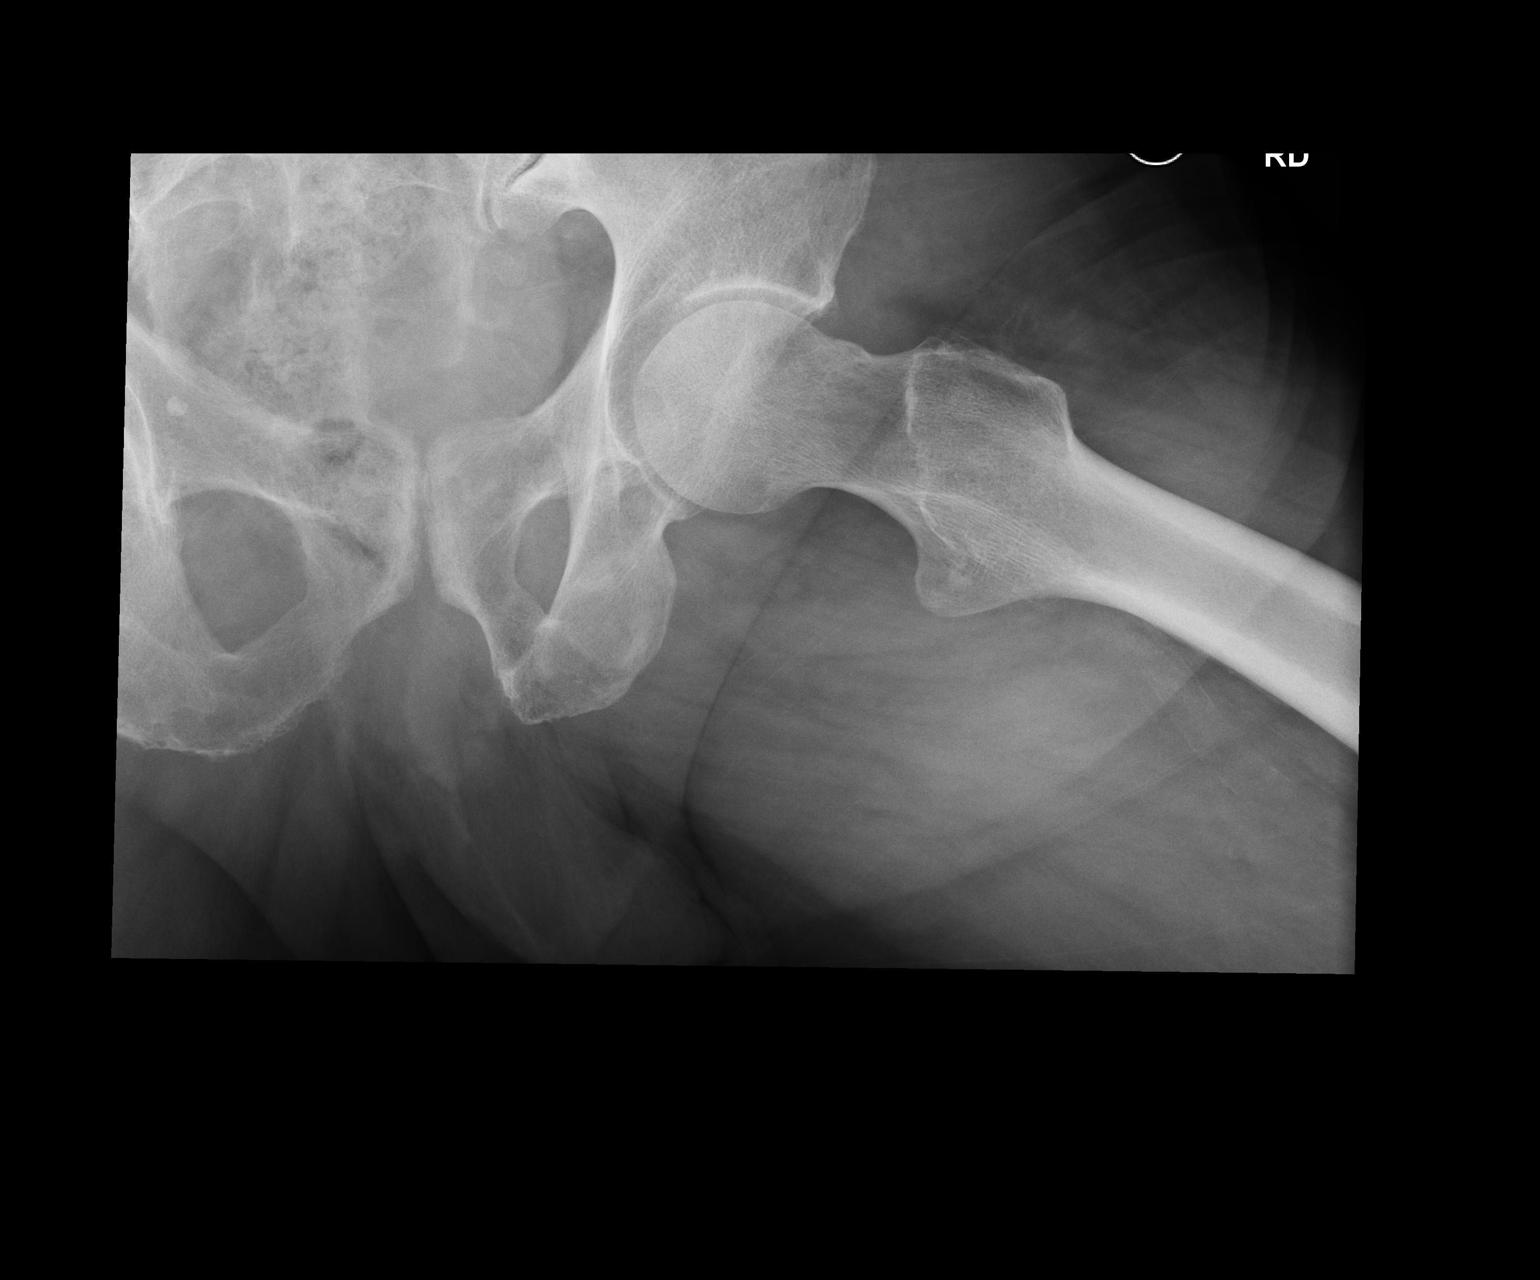
[im 3/3]
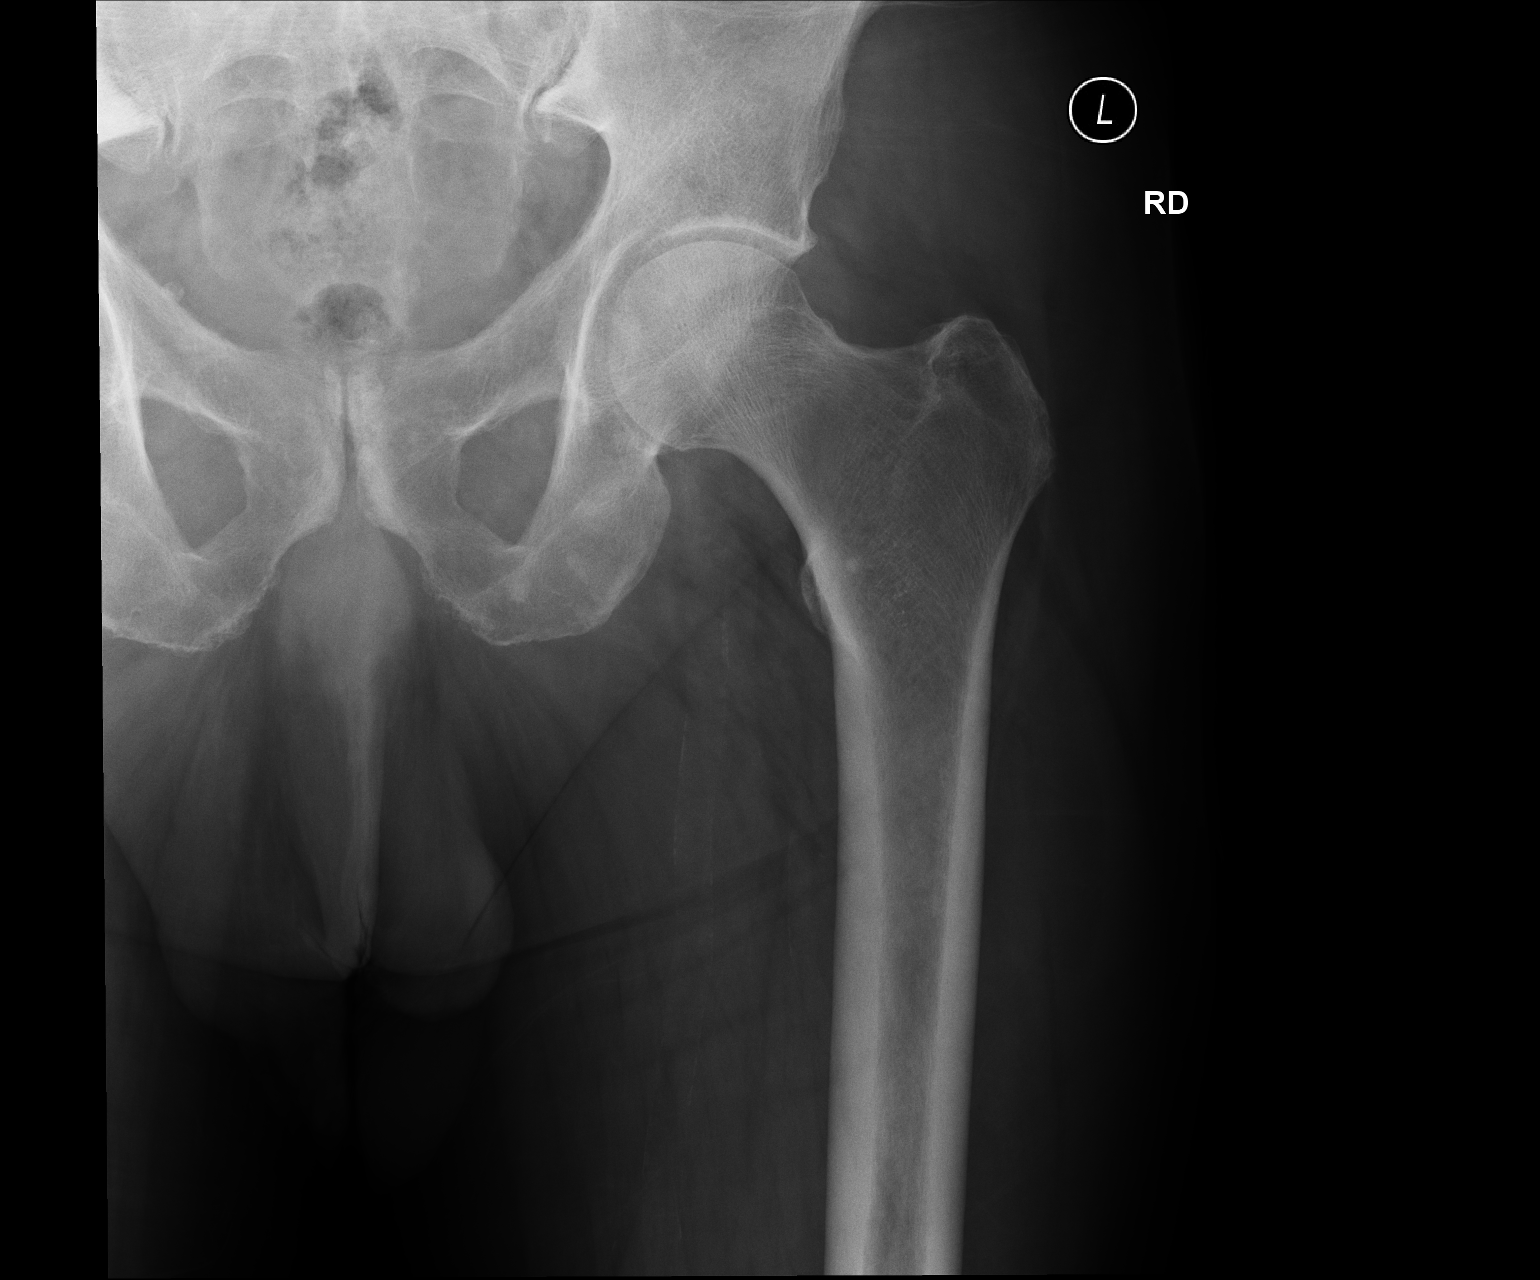

[3 of 3 positions shown; findings below may reference images not displayed]

FINDINGS: No fracture. Normal alignment. Hip joint spaces are preserved. Mild 
degenerative change of the pubic symphysis. Degenerative change of the SI joints 
(greater on the right) and spine. Pelvic enthesopathy. Atherosclerosis.
IMPRESSION: 1.  Hip joint spaces are preserved.  
2.  Degenerative change of the remaining pelvis and spine.

## 2021-10-21 NOTE — Progress Notes (Signed)
Formatting of this note is different from the original.  Images from the original note were not included.  PATIENT: Julian Castaneda [id number]  10/21/2021  This clinic note was copied and updated from previous note from 04/15/2021          Chief Complaint:   Follow up for prostate cancer and hematuria         History of Present Illness:   Julian Castaneda is a very pleasant 81 year old male who presents with a history of  Prostate Cancer.     Prostate Cancer Active Surveillance History in Brief  Date (mm/yy)                      Test                       Result          10/20/2021  03/03/2021  10/21/2020   03/23/2020   04/17/2019   10/23/2017   05/10/2017   12/25/2016   05/25/2016   02/05/2016    PSA 5.29  6.23  5.48   4.52   3.11   3.83   2.91   2.41   2.58   2.97       11/09/2018 1st Prostate Biopsy      positive,          1st Biopsy Grade Group     1         # of positive cores     (4/12)         Highest % of core involved     70%     01/28/2021 2nd Prostate Biopsy      positive,          1st Biopsy Grade Group     2         # of positive cores     (3/13)         Highest % of core involved     80%       Genomics Completed?     No        Genomics Type:    Decipher  Oncotype        Genomics Score:          10/24/2019 1st MRI of Prostate           Prostate Volume (ccs/grams)  60 cc         Highest PIRADS    4     Interval Hx: The patient is visiting today to review results of PSA 10/20/21  PSA (ng/mL)   Date Value   10/20/2021 5.29   10/21/2020 5.48   03/23/2020 4.52   04/17/2019 3.11   10/23/2017 3.83   05/10/2017 2.91   12/25/2016 2.41   05/25/2016 2.58   02/05/2016 2.97          Physical Exam:   Patient is a 81 year old male   Constitutional: Vitals: Blood pressure 163/72, pulse 62.  General Appearance Adult: Alert, no acute distress, oriented  Mouth: throat/mouth:normal as far as I can tell behind the mask  Lungs/Repiratory: no respiratory distress, or pursed lip breathing  Heart: No obvious jugular venous  distension present, normal heart rate  Abdomen: soft, nontender, no organomegaly or masses, Estimated body mass index is 28.8 kg/m as calculated from the following:    Height as of 04/23/20: 175.3 cm (5\' 9" ).    Weight  as of 04/23/20: 88.5 kg (195 lb).,   Musculoskeltal: extremities normal, no peripheral edema  Skin: no noted suspicious lesions or rashes  Neuro: Alert, oriented, speech and mentation normal  Psych: affect and mood normal  Gait: Normal without assistance      Labs and Pathology:      PSA (ng/mL)   Date Value   10/21/2020 5.48   03/23/2020 4.52   04/17/2019 3.11   10/23/2017 3.83   05/10/2017 2.91   12/25/2016 2.41   05/25/2016 2.58   02/05/2016 2.97       Surgical Pathology 01/28/2021  FINAL DIAGNOSIS   A. 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  - Percentage of Gleason pattern 4 = 10%.  - Focal small cribriform morphology present.    B. Prostate, left posterior medial, core biopsy:  - Benign prostatic tissue.     C. Prostate, left posterior lateral, core biopsy:  - Benign prostatic tissue.     D. Prostate, right anterior lateral, core biopsy:  - Benign prostatic tissue.     E. Prostate, right anterior medial, core biopsy:  - Benign prostatic tissue.     F. Prostate, right posterior lateral, core biopsy:  - Benign prostatic tissue.     G. Prostate, right posterior medial, core biopsy:  - Benign prostatic tissue.     Prostate Cancer Biopsy Summary    Number of cores examined: 13  Number of cores positive: 3  Highest Grade Group: 2  Highest % of core involvement: 80%  Cribriform pattern 4: Present  Intraductal carcinoma: Absent  Representative tumor block to use for additional studies: A1       Imaging:    MRI prostate 10/24/2019  IMPRESSION:    UNCHANGED 1.6 CM PI RADS 4 LEFT APEX ANTERIOR TRANSITION ZONE LESION.    NO PELVIC  LYMPHADENOPATHY OR SUSPICIOUS PELVIC BONE LESIONS.         Assessment:   1.This is a 81 year old male with history of presumably localized prostate cancer Grade Group 2 and active surveillance with stable PSA.         Plan:   Follow up in 6 months with PSA     [name], APRN.CNP    Scribed for Dr. Cristal Deer Weight by Kallie Edward, medical scribe on 10/21/2021    I agree with the Chief Complaint, ROS, and Past Histories independently gathered by the clinical support staff including scribe and or medical student and or APP and or resident or fellow and the remaining scribed note accurately describes my personal service to the patient.    Cristal Deer Weight, MD, MS  [facility]   [facility]      Electronically signed by Demetrius Charity, Cristal Deer, MD at 11/07/2021  5:09 PM EDT

## 2021-10-23 ENCOUNTER — Ambulatory Visit
Admit: 2021-10-23 | Discharge: 2021-10-23 | Payer: MEDICARE | Attending: Critical Care Medicine | Primary: Family Medicine

## 2021-10-23 DIAGNOSIS — J45909 Unspecified asthma, uncomplicated: Secondary | ICD-10-CM

## 2021-10-23 DIAGNOSIS — G4733 Obstructive sleep apnea (adult) (pediatric): Secondary | ICD-10-CM

## 2021-10-23 LAB — SPIROMETRY WITHOUT BRONCHODILATOR
Expiratory Time: 7.56 s
FEF 25-75% %Pred-Pre: 73 L/sec
FEF 25-75% Pred: 1.95 L/sec
FEF 25-75%-Pre: 1.43 L/sec
FEV1 %Pred-Pre: 78 %
FEV1 Pred: 2.76 L
FEV1/FVC %Pred-Pre: 96 %
FEV1/FVC Pred: 75 %
FEV1/FVC: 72 %
FEV1: 2.16 L
FVC %Pred-Pre: 80 %
FVC Pred: 3.73 L
FVC: 2.99 L
PEF %Pred-Pre: 112 L/sec
PEF Pred: 6.9 L/sec
PEF-Pre: 7.75 L/sec
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
## 2022-03-30 ENCOUNTER — Ambulatory Visit: Admit: 2022-03-30 | Payer: MEDICARE | Attending: Critical Care Medicine | Primary: Family Medicine

## 2022-03-30 DIAGNOSIS — J45909 Unspecified asthma, uncomplicated: Secondary | ICD-10-CM

## 2022-03-30 LAB — SPIROMETRY WITHOUT BRONCHODILATOR
Expiratory Time: 7 s
FEF 25-75% %Pred-Pre: 101 L/sec
FEF 25-75% Pred: 1.89 L/sec
FEF 25-75-Pre: 1.92 L/sec
FEV1 %Pred-Pre: 81 %
FEV1 Pred: 2.66 L
FEV1/FVC %Pred-Pre: 103 %
FEV1/FVC Pred: 75 %
FEV1/FVC: 77 %
FEV1: 2.18 L
FVC %Pred-Pre: 78 %
FVC Pred: 3.59 L
FVC: 2.81 L

## 2022-03-30 LAB — POCT NITRIC OXIDE: FeNO: 80 [ppb]
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
## 2022-03-31 NOTE — Progress Notes (Signed)
Formatting of this note is different from the original.  PATIENT: Julian Castaneda [id number]  03/30/2022  This clinic note was copied and updated from previous note from 10/21/2021        Chief Complaint:   Follow up for prostate cancer on AS and hematuria         History of Present Illness:   Julian Castaneda is a very pleasant 81 year old male who presents with a history of Prostate Cancer.     Prostate Cancer Active Surveillance History in Brief  Date (mm/yy)                      Test                       Result          03/30/2022  10/20/2021  03/03/2021  10/21/2020   03/23/2020   04/17/2019   10/23/2017   05/10/2017   12/25/2016   05/25/2016   02/05/2016    PSA 6.58  5.29  6.23  5.48   4.52   3.11   3.83   2.91   2.41   2.58   2.97       11/09/2018 1st Prostate Biopsy      positive,          1st Biopsy Grade Group     1         # of positive cores     (4/12)         Highest % of core involved     70%     01/28/2021 2nd Prostate Biopsy      positive,          1st Biopsy Grade Group     2         # of positive cores     (3/13)         Highest % of core involved     80%       Genomics Completed?     No        Genomics Type:    Decipher  Oncotype        Genomics Score:          10/24/2019 1st MRI of Prostate           Prostate Volume (ccs/grams)  60 cc         Highest PIRADS    4     Interval Hx: The patient is visiting today to review results of PSA 03/30/2022  In Process       Physical Exam:   There were no vitals taken for this visit.  General: Alert, no acute distress, oriented  Lungs: No respiratory distress or pursed lip breathing  Psych: Affect and mood normal  Abdomen: Estimated body mass index is 28.8 kg/m as calculated from the following:    Height as of 04/23/20: 175.3 cm (5\' 9" ).    Weight as of 04/23/20: 88.5 kg (195 lb).       Labs and Pathology:    PSA 03/30/2022  6.58  Surgical Pathology 01/28/2021  FINAL DIAGNOSIS   A. 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  - Percentage of Gleason pattern 4 = 10%.  -  Focal small cribriform morphology present.    B. Prostate, left posterior medial, core biopsy:  - Benign prostatic tissue.     C. Prostate, left posterior lateral, core biopsy:  - Benign prostatic tissue.     D. Prostate, right anterior lateral, core biopsy:  - Benign prostatic tissue.     E. Prostate, right anterior medial, core biopsy:  - Benign prostatic tissue.     F. Prostate, right posterior lateral, core biopsy:  - Benign prostatic tissue.     G. Prostate, right posterior medial, core biopsy:  - Benign prostatic tissue.     Prostate Cancer Biopsy Summary    Number of cores examined: 13  Number of cores positive: 3  Highest Grade Group: 2  Highest % of core involvement: 80%  Cribriform pattern 4: Present  Intraductal carcinoma: Absent  Representative tumor block to use for additional studies: A1       Imaging:    CT Urogram 03/31/21  IMPRESSION:     Status post LEFT partial nephrectomy.  No urolithiasis or urothelial   filling defect.     Multiple renal cysts.  No definite enhancing renal mass.     Median lobe hypertrophy of prostate.     Small hiatal hernia.     MRI prostate 10/24/2019~ 60 cc  IMPRESSION:    UNCHANGED 1.6 CM PI RADS 4 LEFT APEX ANTERIOR TRANSITION ZONE LESION.    NO PELVIC LYMPHADENOPATHY OR SUSPICIOUS PELVIC BONE LESIONS.       Assessment:   1.This is a 81 year old male with history of presumably localized prostate cancer Grade Group 2 and active surveillance since 11/09/18 with stable PSA.         Plan:   1.Follow up visit in 6 months with a PSA completed prior to visit.   Discussed the process of AS and what it entails.   If PSA looks concerning when done in a couple of months then we can do MRI Prostate when patient is back in town in May 2024.    Recommend watching the low risk prostate cancer with  a combination of MRIs every one to two years, PSAs every 6 months, digital rectal exams periodically, and repeat biopsy when indicated (usually at least one more if only one biopsy has occurred)    Discussed the literature supporting ACTIVE SURVEILLANCE including the observational data in the [place] and [place] showing an extremely high cancer specific survival.  We also discussed the PIVOT trial demonstrating that treating low risk prostate cancer does not extend survival.    The patient has an extremely low risk of dying from [facility] in the coming years with observation.  There are significant risks with all the prostate cancer treatment types. We do acknowledge that 1-3% die from prostate cancer on surveillance and there is a risk of progression to non-curable disease and even death even while following the protocol exactly.    About 40% will progress to need treatment in the next 10 years, but 60% will stay disease free and untreated for 10-15 years at least.    Scribed for Dr. Harrell Gave Weight by Tomasita Crumble, medical scribe on 03/31/2022    I agree with the Chief Complaint, ROS, and Past Histories independently gathered by the clinical support staff including scribe and or medical student and or APP and or resident or fellow and the remaining scribed note accurately describes my personal service to the patient.    Christopher Weight, MD, [facility]   [facility]  [facility]    Electronically signed by Demetrius Charity, Cristal Deer, MD at 04/26/2022 12:52 AM EST

## 2022-08-28 IMAGING — MR MRI LEFT FEMUR WITHOUT CONTRAST
5 of 7 series · 20 of 40 positions shown · IV contrast (gadolinium)
Comparison: 08/11/2021 pelvic radiographs

________________________________________________________________________________________________ 
MRI LEFT FEMUR WITHOUT CONTRAST, 08/28/2022 [DATE]: 
CLINICAL INDICATION: Strain of muscle, fascia and tendon of the posterior 
muscle. Left hamstring pain for 8 months. No trauma.
TECHNIQUE: Multiplanar, multiecho position MR images of the left thigh/femur 
were performed without intravenous gadolinium enhancement. Patient was scanned 
on a 3T magnet.

[Series 201: survey · sagittal · 8.0mm · 1.57mm/px · 1 of 8 slices shown (1 of 2)]
[im 1/8]
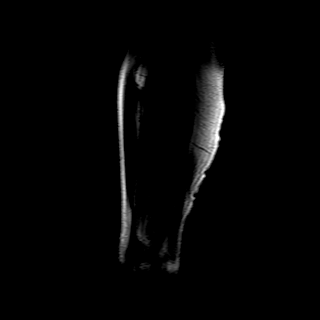

[Series 301: survey · axial · 15.0mm · 1.74mm/px · z∈[+3,+263]mm · 2 of 11 slices shown (2 of 2)]
[im 1/11]
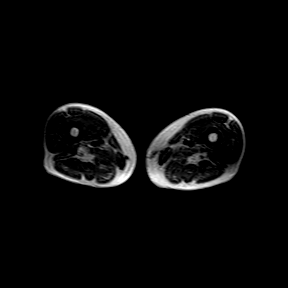
[im 11/11]
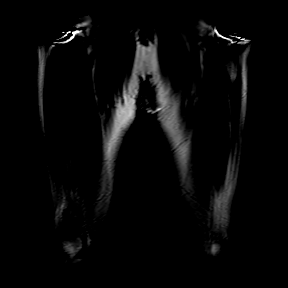

[Series 401: stir_cor · coronal · 5.0mm · 0.78mm/px · 4 of 26 slices shown]
[im 1/26]
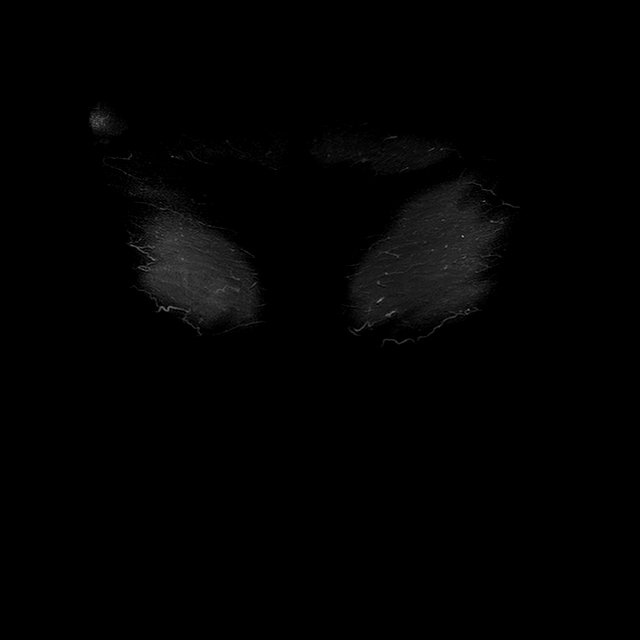
[im 9/26]
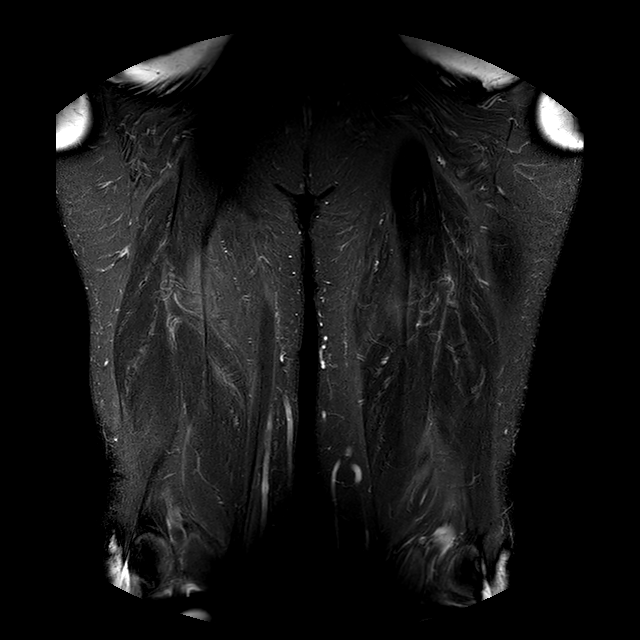
[im 17/26]
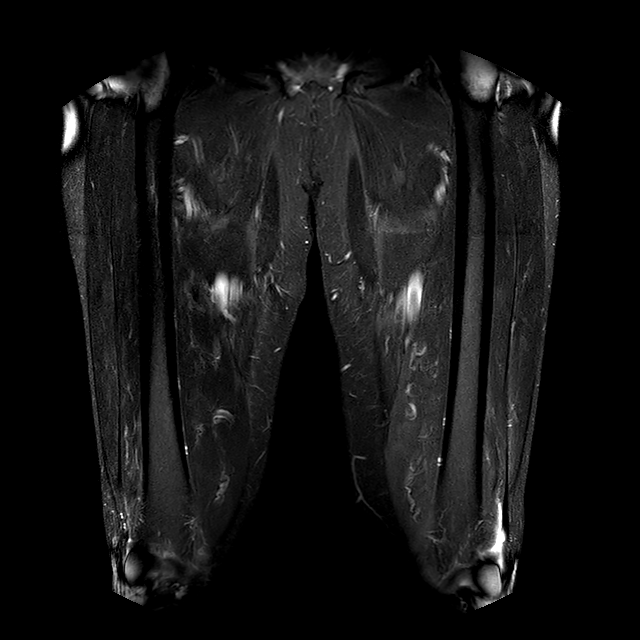
[im 26/26]
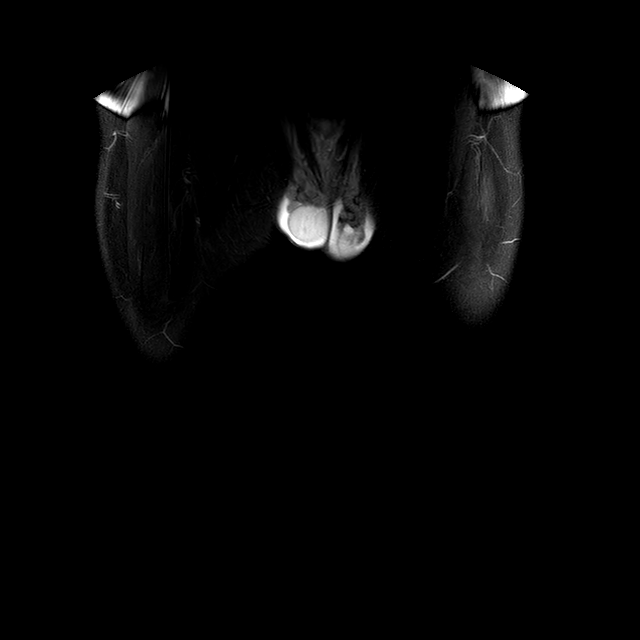

[Series 501: t1w_tse · coronal · 5.0mm · 0.74mm/px · 4 of 26 slices shown]
[im 1/26]
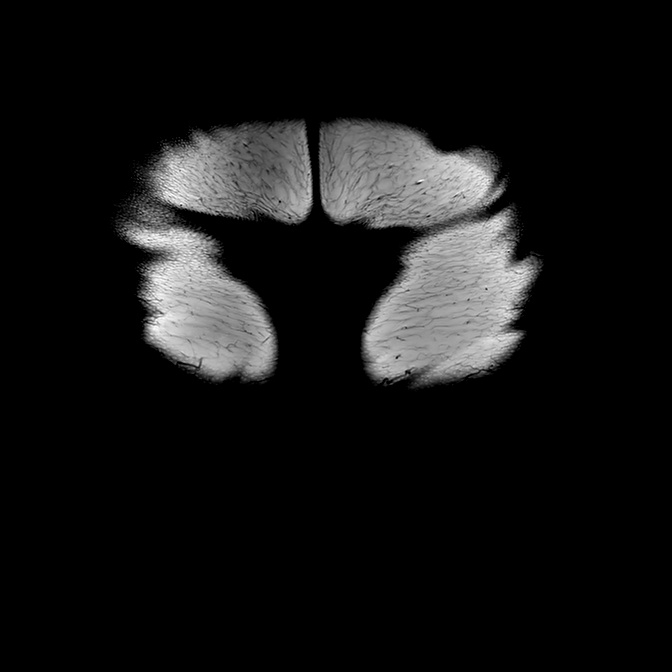
[im 9/26]
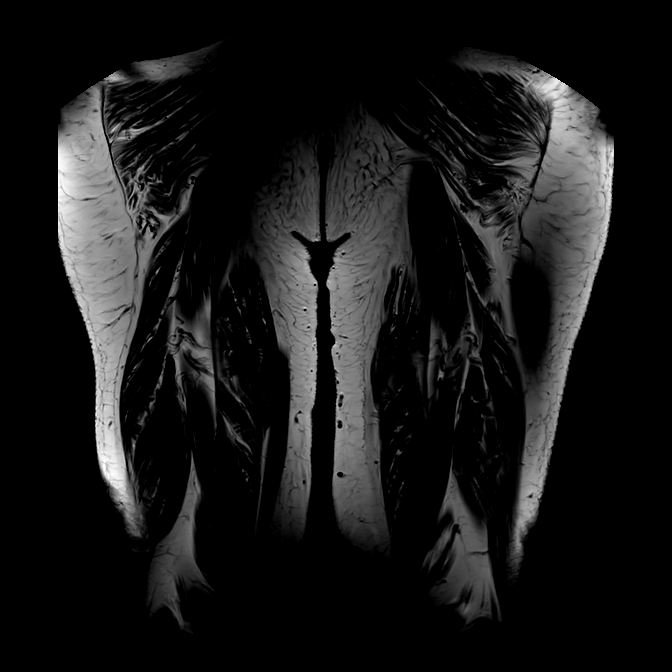
[im 17/26]
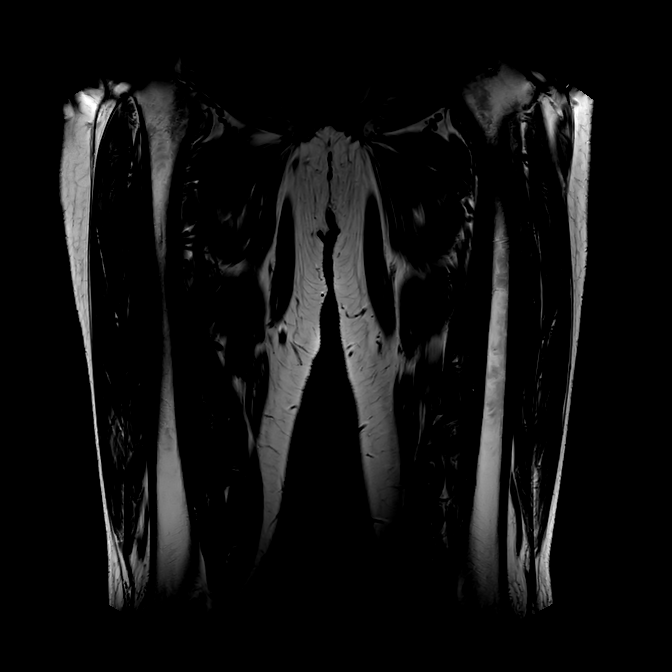
[im 26/26]
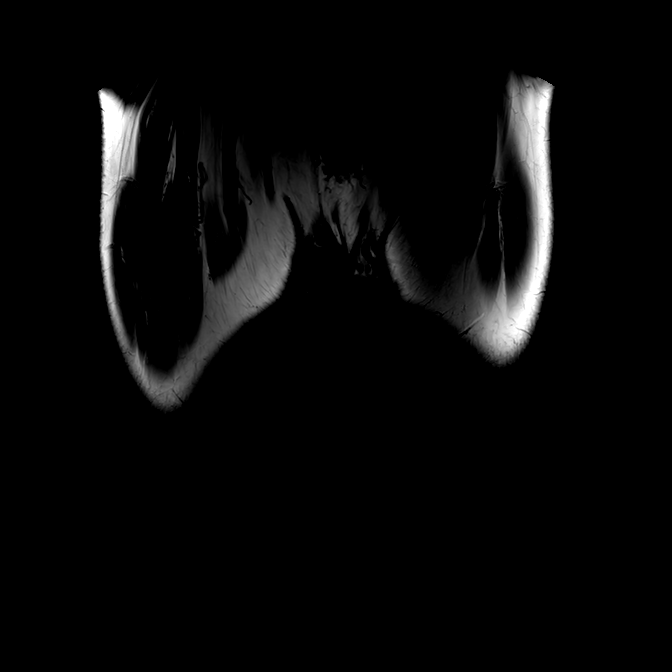

[Series 701: T2 fat-sat · axial · 5.0mm · 0.62mm/px · z∈[-182,+289]mm · 9 of 80 slices shown]
[im 1/80]
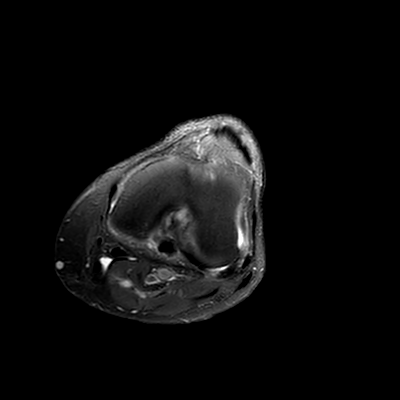
[im 15/80]
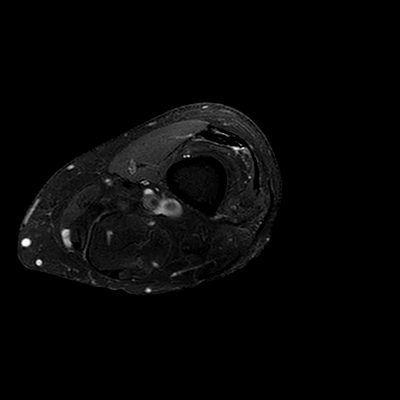
[im 22/80]
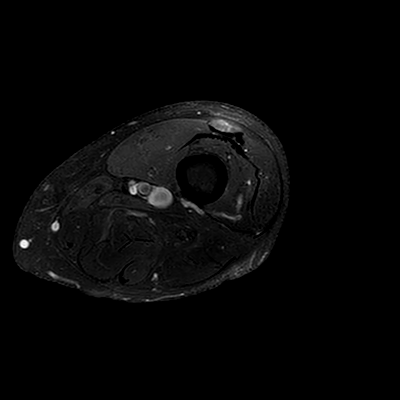
[im 36/80]
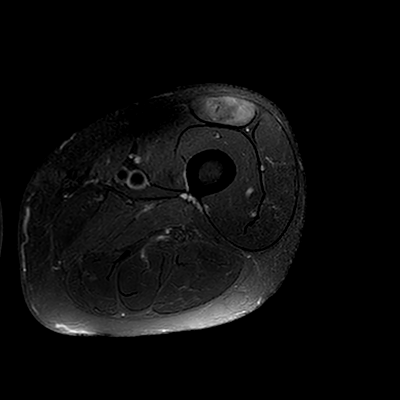
[im 44/80]
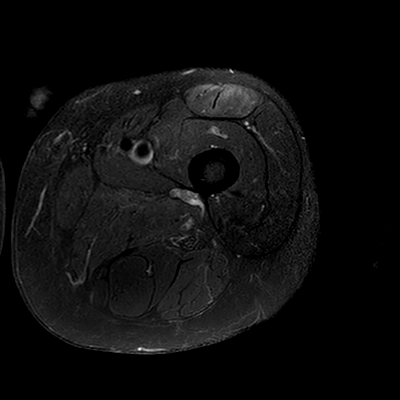
[im 58/80]
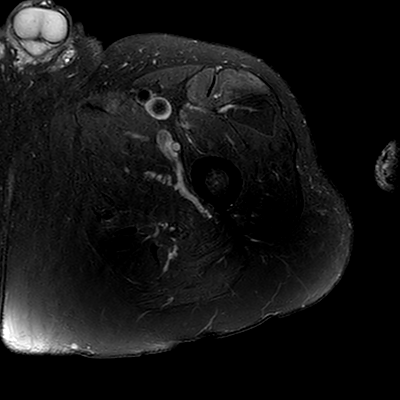
[im 65/80]
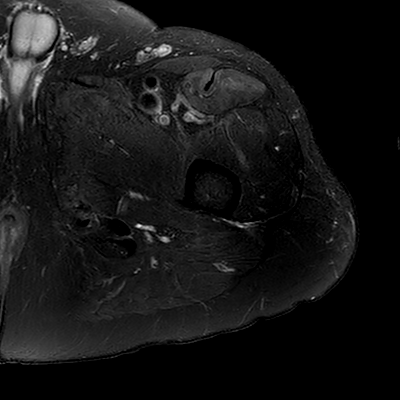
[im 72/80]
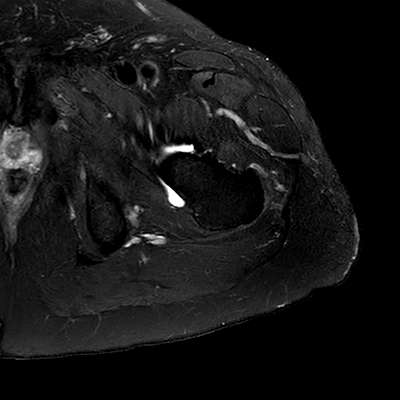
[im 80/80]
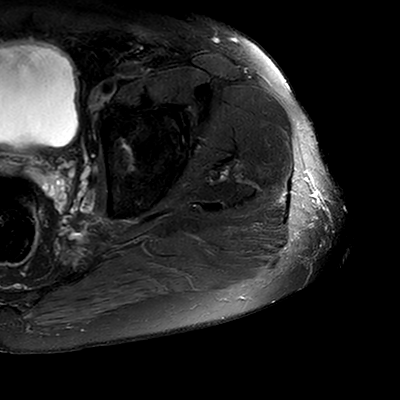

[20 of 40 positions shown; findings below may reference images not displayed]

FINDINGS: BONES: No fracture or subluxation.  Left femoral greater trochanteric 
enthesopathy. 
JOINTS: Hip joint cartilage is preserved. Left anterior acetabular labral tear. 
Degenerative change of the knee, truncation/tear of the posterior horn of the 
medial meniscus and small joint effusion. 
MUSCLES/TENDONS: Tiny low-grade left hamstring origin tendon tears and 
tendinosis. Mild rectus femoris muscle edema, most marked involving the 
mid/distal muscle, consistent with grade 1 muscle strain. 
OTHER SOFT TISSUES: 3.1 x 2.4 x 6.2 cm fatty mass in the left proximal vastus 
lateralis without thick septae or nodular nonadipose components, likely a 
lipoma. Left inguinal herniorrhaphy.No fluid collection. Prostatomegaly and 
prostatic nodularity (partially imaged).
IMPRESSION: 1.  Tiny low-grade left hamstring origin tendon tears and tendinosis.  
2.  Grade 1 rectus femoris muscle strain. 
3.  Left femoral greater trochanteric enthesopathy and left anterior acetabular 
labral tear.  
4.  Degenerative change of the knee, truncation/tear of the posterior horn of 
the medial meniscus and small joint effusion. 
5.  3.1 x 2.4 x 6.2 cm fatty mass in the left proximal vastus lateralis is 
likely a lipoma.  
6.  Left inguinal herniorrhaphy. 
7.  Prostatomegaly and prostatic nodularity.

## 2022-10-26 ENCOUNTER — Ambulatory Visit: Admit: 2022-10-26 | Payer: MEDICARE | Attending: Critical Care Medicine | Primary: Family Medicine

## 2022-10-26 DIAGNOSIS — J45909 Unspecified asthma, uncomplicated: Secondary | ICD-10-CM

## 2022-10-26 LAB — SPIROMETRY WITHOUT BRONCHODILATOR
Expiratory Time: 6.95 s
FEF 25-75% %Pred-Pre: 81 L/sec
FEF 25-75% Pred: 1.87 L/sec
FEF 25-75-Pre: 1.53 L/sec
FEV1 %Pred-Pre: 77 %
FEV1 Pred: 2.65 L
FEV1/FVC %Pred-Pre: 99 %
FEV1/FVC Pred: 75 %
FEV1/FVC: 75 %
FEV1: 2.05 L
FVC %Pred-Pre: 76 %
FVC Pred: 3.57 L
FVC: 2.73 L

## 2022-10-26 LAB — POCT NITRIC OXIDE: FeNO: 83 [ppb]
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
## 2023-10-29 ENCOUNTER — Encounter: Payer: MEDICARE | Attending: Critical Care Medicine | Primary: Family Medicine

## 2023-11-11 ENCOUNTER — Encounter: Payer: MEDICARE | Attending: Critical Care Medicine | Primary: Family Medicine

## 2023-11-19 ENCOUNTER — Ambulatory Visit
Admit: 2023-11-19 | Discharge: 2023-11-24 | Payer: MEDICARE | Attending: Critical Care Medicine | Primary: Family Medicine

## 2023-11-19 VITALS — BP 155/70 | HR 74 | Temp 98.00000°F | Resp 18

## 2023-11-19 DIAGNOSIS — J45909 Unspecified asthma, uncomplicated: Secondary | ICD-10-CM

## 2023-11-19 LAB — NITRIC OXIDE: Parts Per Billion: 38

## 2023-11-19 LAB — SPIROMETRY WITHOUT BRONCHODILATOR
Expiratory Time: 7 s
FEF 25-75% %Pred-Pre: 87 L/s
FEF 25-75% Pred: 1.84 L/s
FEF 25-75-Pre: 1.6 L/s
FEV1 %Pred-Pre: 76 %
FEV1 Pred: 2.62 L
FEV1/FVC %Pred-Pre: 102 %
FEV1/FVC Pred: 75 %
FEV1/FVC: 77 %
FEV1: 2 L
FVC %Pred-Pre: 73 %
FVC Pred: 3.54 L
FVC: 2.61 L

## 2023-11-19 MED ORDER — METHYLPREDNISOLONE 4 MG PO TBPK
4 | PACK | ORAL | 1 refills | 6.00000 days | Status: AC
Start: 2023-11-19 — End: 2023-11-25

## 2023-11-19 MED ORDER — BREZTRI AEROSPHERE 160-9-4.8 MCG/ACT IN AERO
160-9-4.8 | Freq: Two times a day (BID) | RESPIRATORY_TRACT | 0 refills | 120.00000 days | Status: AC
Start: 2023-11-19 — End: ?

## 2023-11-19 NOTE — Progress Notes (Signed)
 [facility]  Department of Pulmonary, Critical Care and Sleep Medicine  Pulmonary Health & [facility]  Department of Internal Medicine  Office Note      Dear Lovella Rubin, Raenelle Bumpers, MD    We had the pleasure of seeing  Julian Castaneda in the [facility] regarding his chronic sinusitis, GERD S/p Nissen, reactive airway disease & obstructive sleep apnea.     HISTORY OF PRESENT ILLNESS:    Julian Castaneda is a 83 y.o. year old male who has a long standing history of allergies, elevated Ig E level around 600 requiring the administration of Xolair  therapy. 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 Last level in 06/2010 was negative. The her recall He under when endoscopic surgery on 06/30/07 antrostomy bilateral with ethmoidectomies and left single sphenoidotomies and septoplasty. His treatment is working well, which included nasal rinses, Patanase, Astelin, and Xolair . I will  With his sleep apnea he is very compliant using the CPAP machine nightly for at least 6 hour. In 2019, he has a elevated PSA and a recent biopsy done showed prostate cancer Karenann Other score 6).  He is holding on treatment. He has long standing GERD requiring anti-reflux surgery years ago. His last EGD which was negative by Dr. Santos Cullens he has had no repeat complaints. His antireflux surgery anastomosis was stable and clear. In 2019, MRI was done and ok. He did have a ER visit in [place], [place] for what appears to be dehydration, he underwent a echocardiogram and stress test.     Since the last visit, Julian Castaneda is doing well. He has noted an increase in sputum clear and yellow in color and SOB. He was given albuterol and it helped. He has no new complaints. As far as his sleep apnea  is concerned, he has been compliant with his CPAP 12 cm H20 and uses it nightly. His ACT is 24/25. He has no CP, SOB or leg edema. No neurologic symptoms. He is leaving to go back to [place]  on Sunday. Weight loss discussed. We told as recommened as needed ICS/LABA     ALLERGIES:    Allergies   Allergen Reactions    Enalapril Swelling     Of lower lip    Esomeprazole Hives and Swelling    Nexium [Esomeprazole Magnesium Trihydrate] Swelling    Nsaids Swelling    Ranitidine Hives and Swelling    Reglan [Metoclopramide Hcl] Other (See Comments)     States has bloating with this       PAST MEDICAL HISTORY:       Diagnosis Date    Asthma     doing well per patient    Chronic pansinusitis 10/03/2014    COPD (chronic obstructive pulmonary disease) (HCC)     CSS (Churg-Strauss syndrome) (HCC)     no current issues    Elevated PSA     negative biospy    Encounter for screening colonoscopy     and EGD 12-28-16     GERD (gastroesophageal reflux disease)     Hyperlipidemia     Hypertension     OSA on CPAP     OSA treated with BiPAP 03/27/2014    Sepsis (HCC)     06-2015 - resolved  MEDICATIONS:   Current Outpatient Medications   Medication Sig Dispense Refill    aspirin 81 MG chewable tablet Take 1 tablet by mouth daily      hydroCHLOROthiazide  (HYDRODIURIL ) 25 MG tablet Take 1 tablet by mouth daily      losartan (COZAAR) 100 MG tablet Take 1 tablet by mouth daily      vitamin D (CHOLECALCIFEROL) 1000 UNIT TABS tablet Take 1 tablet by mouth daily LD 12-23-16      finasteride (PROSCAR) 5 MG tablet Take 1 tablet by mouth daily States he will not take until he is done with his daily antibiotics      amLODIPine  (NORVASC ) 10 MG tablet Take 1 tablet by mouth daily      atorvastatin  (LIPITOR ) 80 MG tablet Take 1 tablet by mouth daily       No current facility-administered medications for this visit.       SOCIAL AND OCCUPATIONAL HEALTH:  He is married with four children. One son died from HF, daughter died from CAD all young. Wife  has dementia (advanced). The patient is a never smoked  There is not history of TB or TB exposure.  No Etoh Abuse.  There is not asbestos or silica dust exposure.  The patient reports no coal, foundry, quarry or [employer] exposure.  Travel history reveals no trips.  Denies history of recreational or IV drug use. Denies hot tub exposure. The patient has no pets. Hobbies include watching soap opera.  His wife passed away and we reflected on her death and discussed the issues with hospice and the cemetery.     Social History     Tobacco Use    Smoking status: Never    Smokeless tobacco: Never   Vaping Use    Vaping status: Never Used   Substance Use Topics    Alcohol use: No     Alcohol/week: 0.0 standard drinks of alcohol    Drug use: No       SURGICAL HISTORY:   Past Surgical History:   Procedure Laterality Date    COLONOSCOPY      COLONOSCOPY N/A 12/28/2016    COLONOSCOPY WITH BIOPSY performed by Kristan Petit, MD at [facility] ENDOSCOPY    COLONOSCOPY N/A 04/24/2020    COLONOSCOPY POLYPECTOMY SNARE/COLD BIOPSY performed by Kristan Petit, MD at  [facility] ENDOSCOPY    CYSTOSCOPY      prostate biopsy - 06/2015     ENDOSCOPY, COLON, DIAGNOSTIC  01/22/2014    GASTRIC FUNDOPLICATION  2005    HERNIA REPAIR      NASAL POLYP SURGERY      PROSTATE BIOPSY  2011    negative pathology    SEPTOPLASTY      TUMOR REMOVAL  11/2010    fatty tumor removed Dr Santos Cullens    UPPER GASTROINTESTINAL ENDOSCOPY  12/2009    with biopsy, pathology negative    UPPER GASTROINTESTINAL ENDOSCOPY N/A 12/28/2016    EGD BIOPSY performed by Kristan Petit, MD at [facility] ENDOSCOPY    UPPER GASTROINTESTINAL ENDOSCOPY N/A 04/24/2020    EGD BIOPSY performed by Kristan Petit, MD at [facility] ENDOSCOPY       FAMILY HISTORY:   Family History   Problem Relation Age of Onset    High Blood Pressure Father     High Cholesterol Father     Asthma Sister     Diabetes Sister        REVIEW OF SYSTEMS:   Constitutional: General health good .  No  weight changes.   No  fevers, fatigue or weakness.   Head:  Patient denies any history of trauma, convulsive disorder or syncope.    Skin:  Patient denies history of changes in pigmentation, eruptions or pruritus.  No easy bruising or bleeding.  EENT: Patient denies any history of color blindness, photophobia, diplopia, inflammation, cataracts or glaucoma.   Patient denies history of deafness, tinnitus, pain, discharge or recurrent infections.    Patient has a long history of rhinitis, chronic nasal discharge, drainage, & nasal polyps; all requiring surgery. Patient denies history of soreness of mouth or tongue.   Patient denies history of hoarseness, voice changes, sore throats or tonsillitis.    Lymphatic:  Patient denies history of enlargement, inflammation, pain or suppuration.    Cardiovascular:  Patient denies history of palpations, heart murmur, irregular rhythm, chest pain, exertional dyspnea, cyanosis, ascites, rheumatic fever, cold extremities or edema.  Respiration:  Patient denies wheezing, dyspnea, nocturnal dyspnea, cough, hemoptysis, pleurisy, TB or asthma.   Known OSA on PAP. 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   Gastrointestinal: Patient denies changes in appetite. No dysphagia, odynophagia or abdominal pain.   No  hematochezia, melena, bowel habit changes or hemorrhoids.     No  jaundice. GERD Gastic surgery in past with poor results.   Genitourinary:   Patient denies dysuria, frequency, urgency or incontinence.  Complains of difficulty in starting or stopping urinary stream.    Reports history of stones which is followed by [facility].  He has benign prostatic hypertrophy. (2017) prostate cancer. + Cancer Gleeson 6 + Biopsy.   Musculoskeletal: The patient denies history of arthritis or joint pains.   No  loss of strength or dislocation.  Breasts:  No history of masses, lumps, pain, nipple changes or painful nipples.  Neurological: Patient denies vertigo, syncope.   No  twitching or memory.   Psychological: Patient denies moodiness, depression or  anxiety.  No obsessions or hallucinations.    Endocrine:  No history of goiter, exophthalmos or dryness of skin.   No  polyuria or polyphagia.  No diabetes.   Hematopoietic:  No history of bleeding disorders or easy bruising.  Rheumatic:  No polyarthritis or inflammatory joint disease. History of positive P-ANCA    PHYSICAL EXAMINATION:  Vitals:    11/19/23 1606   BP: (!) 155/70   BP Site: Left Upper Arm   Pulse: 74   Resp: 18   Temp: 98 F (36.7 C)   TempSrc: Skin   SpO2: 97%     Constitutional: A 83 y.o.  male  who is alert, oriented, cooperative and in no apparent distress.  Head was normocephalic and atraumatic.  Male patten baldness.   EENT: Eyes were equal and reactive to light and accommodation.  No discharge was appreciated.  Septum was mildly deviated, mucosa was without erythema, exudates or cobblestoning.  No thrush.  Swollen turbinates.   Neck: Supple without thyromegaly. No elevated JVP. Trachea was midline. No carotid bruits.    Respiratory: Symmetrical without dullness to percussion.  Breath sounds bilaterally were clear to auscultation. No wheezes, rhonchi or rales. No egophony.   Cardiovascular: Regular without murmur, clicks, gallops or rubs.  No heave.    Pulses:  Equal bilaterally.    Abdomen: Soft without organomegaly. No rebound, rigidity or guarding.  Surgical (+) scars  Lymphatic: No lymphadenopathy.  Musculoskeletal:No gross muscle weakness.    Extremities:  Trace lower extremity edema.  Muscle size, tone and strength are normal.  Sensory function appears intact.  Deep tendon reflexes are normal.   Skin:  Warm and dry.  Good color, turgor and pigmentation. No lesions. Varicoties. No rash.   Neurological/Psychiatric: General behavior, level of consciousness, thought content and emotional status is normal.  Cranial nerves II-XII are intact.      DATA:     Today Spirometry compared to previous demonstrates an FVC of 2.99 which is 80 % of predicted with an FEV1 of 2.16 liters which is 78 % of  predicted.  FEV1/FVC ratio is 72%.  Maximum voluntary ventilation is 76 liters per minute or 68 % of predicted.  Flow volume loop shows no signs of intrathoracic or extrathoracic obstruction. Impression.  Nonspecific ventilatory limitation    Asthma Control Test [ACT] is a 5 part questionnaire, which has a point value system that ranges from 1- 5. This test is a validated questionnaire to help determine asthma control.  A total score of 19 or below indicates that the patients asthma may not be well controlled. If used in conjunction with PFTs, the correlation of determining asthma control is even stronger.  Today on testing the ACT was 20 out of max of 25. Impression: Poor control.     Epworth Sleepiness Scale is use this Tool to Measure Sleep Deprivation. The Epworth Sleepiness Scale was developed by researchers in [place] and is widely used by sleep professionals around the world to measure sleep deprivation.  How likely are you to doze off or fall asleep in the following situations, in contrast to feeling just tired? This refers to your usual way of life in recent times.  Results: Total Epworth Score: 3  Interpretation: No hypersomnolence.    IMPRESSION:      Julian Castaneda a 83 y.o. male reactive airway disease, chronic cough from GERD S/p Nissen and severe nasal polyposis S/p surgery x 2. While we have never proven the systemic vasculitis or necrotizing vasculitis part and he has no renal, neuropathic or cardiovascular involvement; the finding of chronic sinusitis, eosinophilia, pANCA (+) and asthma aid in the diagnosis.  He has no neuropathy or neuropathic mononeuritis. His p-ANCA was 1:160 but fluctuates.  However, the clinical utility of ANCA in CSS raises a controversial issue of the sensitivity and specificity of ANCA in systemic vasculitis. In CSS P-ANCA is 50%.  He should never receive leukotriene modifier, allopurinol, propylthiouracil and quinine with this history of CSS for it will cause active  flairs.  So we have been monitoring and he is without flair or recurrence = false + test.  With his chronic allergies, sinusitis with rhinitis and nasal  polyposis is stable after surgery. He also has a failed Nissen fundoplication with chronic gastroparesis and GERD stable based on last. As for his sleep apnea he is doing very well with his CPAP @ 12 cm H20.  With this in mind, we would like to proceed with the following;                PLAN: I stated that given his increase frequency of symptoms; cough, mucous, sputum and SOB he should use his ICS/LABA combination inhaler as needed to daily and we will continue to monitor. He remains active. He still walking every morning but down to 2 miles. His Epworth sleepiness score was normal.  Sleep apnea symptoms are excellent and we see no need to adjust therapy. We adjusted his new machine.  Vaccines were discussed and current.  He will follow up in  a year  for re-evaluation.   We hope this updates you on my evaluation and clinical thinking. Thank you for allowing us  to participate in [place] care.     Sincerely,    Darilyn Edin, D.O., MPH, Graciella Lavender, FACP  Professor of Internal Medicine  Director, [facility]

## 2023-11-19 NOTE — Progress Notes (Signed)
 1 year follow up; sample of Breztri inhaler given per orders and nebulizer device. Pt will call when he comes home to [place]  for annual visit.

## 2024-03-30 ENCOUNTER — Ambulatory Visit: Admit: 2024-03-30 | Discharge: 2024-03-30 | Payer: MEDICARE | Primary: Family Medicine

## 2024-03-30 NOTE — Progress Notes (Signed)
 "    [facility]  Department of Pulmonary, Critical Care and Sleep Medicine  Dr. Nida, Dr. Dorethia, Dr. Clemetine, Dr. Laurina,   Sherryle Heron, APRN, Dawna Fair, APRN    Pulmonary & Critical Care Office Note - Follow up      Assessment/Plan     Problem List Items Addressed This Visit          Digestive    Gastroesophageal reflux disease     Other Visit Diagnoses         Reactive airway disease without complication, unspecified asthma severity, unspecified whether persistent    -  Primary      Chronic cough          OSA (obstructive sleep apnea)              HPI copied from Dr.Barreiro for completeness  HISTORY OF PRESENT ILLNESS:    Julian Castaneda is a 83 y.o. year old male who has a long standing history of allergies, elevated Ig E level around 600 requiring the administration of Xolair  therapy. 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 Last level in 06/2010 was negative. The her recall He under when endoscopic surgery on 06/30/07 antrostomy bilateral with ethmoidectomies and left single sphenoidotomies and septoplasty. His treatment is working well, which included nasal rinses, Patanase, Astelin, and Xolair . I will  With his sleep apnea he is very compliant using the CPAP machine nightly for at least 6 hour. In 2019, he has a elevated PSA and a recent biopsy done showed prostate cancer Karie score 6).  He is holding on treatment. He has long standing GERD requiring anti-reflux surgery years ago. His last EGD which was negative by Dr. Regino he has had no repeat complaints. His antireflux surgery anastomosis was stable and clear. In 2019, MRI was done and ok. He did have a ER visit in [place], [place] for what appears to be dehydration, he underwent a echocardiogram and  stress test.      Updated HPI as of 03/31/2024: Patient seen and examined today in clinic for follow-up evaluation regarding his reactive airway condition.  Since his prior visit, he endorses experiencing a cough with the use of his Breztri  inhaler.  He states he stopped using his Breztri  and his cough was immediately relieved.  He denies any current symptoms of shortness of breath, cough, chest pain, fever, chills, unintentional weight loss.  He continues to use his CPAP at night for his OSA with therapeutic benefit.  He lives in [place]  and travels twice a year in May and October to follow-up with his providers since he is well-established.  We discussed continuing his albuterol inhaler as needed.  Julian Castaneda had questions regarding when to use his albuterol nebulizer.  We agreed to utilize with symptoms such as cough/congestion and wheezing.  He continues to walk 2 miles a day without experiencing shortness of breath.  All questions and concerns were answered.  Return to clinic in 6 months.    Chief Complaint: Reactive airway disease    Assessment:  Reactive airway disease  Chronic cough secondary to GERD s/p Nissen fundoplication with chronic gastroparesis  OSA, on CPAP at night  History of presumably localized prostate cancer, under active surveillance since 11/09/2018, continues to follow with [facility] clinic urology  Plan:     Stop Breztri  as it is not providing therapeutic benefit    P.r.n. albuterol  Advised on proper inhaler technique, and adherence to prescribed inhalers    Nodule OR Screen - 50-80, smoked >= 20 pack years, smoke or quit within 15 yrs    Aspiration / GERD precautions  Head end of bed elevation.    Maintain active and healthy lifestyle with weight reduction.  COVID-19 precautions  Recommend yearly Influenza and appropriate pneumonia vaccinations.    Family History   Problem Relation Age of Onset    High Blood Pressure Father     High Cholesterol Father     Asthma Sister     Diabetes Sister          Follow up: Return in about 6 months (around 09/28/2024).      Sherryle Heron, APRN-CNP  Pulmonary & Critical Care Medicine  [facility]  Dr. Nida, Dr. Dorethia, Dr. Clemetine, Dr. Laurina        Immunization History   Administered Date(s) Administered    COVID-19, Inactive, MODERNA BLUE border, Primary or Immunocompromised, (age 12y+) 06/22/2019, 07/20/2019    COVID-19, Inactive, MODERNA Bivalent, (age 12y+) 03/06/2021    COVID-19, Inactive, MODERNA Booster BLUE border, (age 18y+) 02/05/2020    COVID-19, Inactive, PFIZER GRAY top, DO NOT Dilute, (age 25 y+) 09/15/2020    COVID-19, CLEOLA Laren), (age 12y+), IM, 29mcg/0.5mL 03/23/2022, 10/06/2022, 03/08/2023    Influenza Virus Vaccine 03/15/2012, 03/15/2013, 03/27/2014    Influenza Whole 03/31/2010    Influenza, AFLURIA (age 60 y+), FLUZONE, (age 45 mo+), Quadv MDV, 0.48mL 04/30/2016    Influenza, FLUZONE High Dose, (age 57 y+), IM, Trivalent PF, 0.5mL 04/18/2018, 04/05/2019    Pneumococcal Conjugate 7-valent (Prevnar7) 02/25/2009, 02/20/2019       Subjective     Last office visit: 11/19/2023    Exacerbations: None    Pulm Meds: Albuterol as needed    Antiplatelet/anticoagulants: Aspirin 81 mg daily    Smoking history: Non-smoker    Quit date: N/A    Exercise tolerance/MMRC score( Bold )      MMRC Dyspnea Scale  Grade Description of Breathlessness   0 I only get breathless with strenuous exercise.   1 I get short of breath when hurrying on level ground or walking up a slight hill.   2 On level ground, I walk slower than people of the same age because of breathlessness, or have to stop for breath when walking at my own pace.   3 I stop for breath afterwalking about 100 yards or after a few minutes on level ground.   4 I am too breathless to leave the house or I am breathless when dressing.       PFT 11/19/2023 10/26/2022   FVC 2.61 (73%) 2.73 (76%)   FEV1 2.00 (76%) 2.05 (77%)   FVC/  FEV1 75% 75%     FEV1   Date Value Ref Range Status    11/19/2023 2.00 L Final     FVC   Date Value Ref Range Status   11/19/2023 2.61 L Final     FEV1/FVC   Date Value Ref Range Status   11/19/2023 77 % Final       Imaging   Reviewed imaging studies personally and findings as below    CT chest: 01/31/2016      IMPRESSION:  Unremarkable CT Chest.    ECHO: None    Sleep Study: None noted in  epic    ALLERGIES:  Allergies   Allergen Reactions    Enalapril Swelling     Of lower lip    Esomeprazole Hives and Swelling    Nexium [Esomeprazole Magnesium Trihydrate] Swelling    Nsaids Swelling    Ranitidine Hives and Swelling    Reglan [Metoclopramide Hcl] Other (See Comments)     States has bloating with this       SOCIAL HISTORY:   Social History     Tobacco Use    Smoking status: Never    Smokeless tobacco: Never   Vaping Use    Vaping status: Never Used   Substance Use Topics    Alcohol use: No     Alcohol/week: 0.0 standard drinks of alcohol    Drug use: No     MEDS:   Current Outpatient Medications   Medication Sig Dispense Refill    furosemide (LASIX) 20 MG tablet Take 1 tablet by mouth daily as needed      aspirin 81 MG chewable tablet Take 1 tablet by mouth daily      hydroCHLOROthiazide  (HYDRODIURIL ) 25 MG tablet Take 1 tablet by mouth daily      losartan (COZAAR) 100 MG tablet Take 1 tablet by mouth daily      vitamin D (CHOLECALCIFEROL) 1000 UNIT TABS tablet Take 1 tablet by mouth daily LD 12-23-16      finasteride (PROSCAR) 5 MG tablet Take 1 tablet by mouth daily States he will not take until he is done with his daily antibiotics      amLODIPine  (NORVASC ) 10 MG tablet Take 1 tablet by mouth daily      atorvastatin  (LIPITOR ) 80 MG tablet Take 1 tablet by mouth daily      Budeson-Glycopyrrol-Formoterol (BREZTRI  AEROSPHERE) 160-9-4.8 MCG/ACT AERO Inhale 2 puffs into the lungs in the morning and at bedtime (Patient not taking: Reported on 03/30/2024) 2 each 0     No current facility-administered medications for this visit.       Review of Systems: Negative other than  specified above.      Objective       Vitals:  BP: 137/65, Pulse: 69, Respirations: 18, Temp: 98.4 F (36.9 C), Temp Source: Skin, SpO2: 95 %,  ,      BMI body mass index is unknown because there is no height or weight on file.  Ideal Body Weight: Patient weight not recorded  ---------------  Physical Exam:    General: Alert and oriented x 3. No acute distress.  Eyes:  Vision - grossly normal, PERRLA  HEENT:  Head is atraumatic and normocephalic.  Neck is supple, no jugular venous distention.  Respiratory:  Lungs are clear to auscultation, respirations are nonlabored, breath sounds are equal.  Cardiovascular:  S1, S2 normal, regular rate and rhythm, no murmur, no pedal edema  Gastrointestinal:  Soft, nontender, nondistended.  Normal bowel sounds.  No organomegaly  Neurologic:  Awake and alert, cranial nerves 2-12 grossly intact, no focal motor or sensory deficits.  Musculoskeletal: Ambulates independently      Labs     CBC with Differential:    Lab Results   Component Value Date/Time    WBC 4.6 07/15/2015 08:22 AM    RBC 4.51 07/15/2015 08:22 AM    HGB 12.7 07/15/2015 08:22 AM    HCT 38.5 07/15/2015 08:22 AM    PLT 187 07/15/2015 08:22 AM    MCV 85.4 07/15/2015 08:22 AM    MCH 28.2 07/15/2015 08:22 AM  MCHC 33.0 07/15/2015 08:22 AM    RDW 13.7 07/15/2015 08:22 AM    NRBC 0.0 06/25/2015 05:15 AM    LYMPHOPCT 33.6 07/15/2015 08:22 AM    MONOPCT 6.4 07/15/2015 08:22 AM    EOSPCT 2.0 07/15/2015 08:22 AM    BASOPCT 0.7 07/15/2015 08:22 AM    MONOSABS 0.29 07/15/2015 08:22 AM    LYMPHSABS 1.53 07/15/2015 08:22 AM    EOSABS 0.09 07/15/2015 08:22 AM    BASOSABS 0.03 07/15/2015 08:22 AM     CMP:    Lab Results   Component Value Date/Time    NA 139 07/15/2015 08:22 AM    K 3.6 07/15/2015 08:22 AM    CL 101 07/15/2015 08:22 AM    CO2 25 07/15/2015 08:22 AM    BUN 21 07/15/2015 08:22 AM    CREATININE 0.8 07/15/2015 08:22 AM    GFRAA >60 07/15/2015 08:22 AM    LABGLOM >60 07/15/2015 08:22 AM    GLUCOSE 98 07/15/2015 08:22 AM     GLUCOSE 86 06/21/2009 11:00 AM    CALCIUM  9.2 07/15/2015 08:22 AM    BILITOT 1.1 07/15/2015 08:22 AM    ALKPHOS 83 07/15/2015 08:22 AM    AST 23 07/15/2015 08:22 AM    ALT 21 07/15/2015 08:22 AM       I spent 30 minutes providing this service today, to include the time spent seeing the patient, documenting, and reviewing chart excluding any separately billed procedures.     I hope this updates you on my evaluation and clinical thinking. Thank you for allowing me to participate in the care of Julian Castaneda.      Sincerely,    Electronically signed by Sherryle CHRISTELLA Heron, APRN - CNP on 03/31/2024 at 1:40 PM  "

## 2024-03-30 NOTE — Progress Notes (Signed)
"  Pt here for f/u appointment pt denies SOB cough chest tightness pt stated he lives in [place]  and travels twice a year to f/u with established drs. Pt stated he does not take breztri  he started coughing and had stuffy nose. Provider wants f/u in 6 months.  "
